# Patient Record
Sex: Female | Born: 1935 | Race: White | Hispanic: No | Smoking: Never smoker
Health system: Southern US, Community
[De-identification: ages and names within clinical notes are randomized; demographics above are authoritative.]

## PROBLEM LIST (undated history)

## (undated) DIAGNOSIS — N949 Unspecified condition associated with female genital organs and menstrual cycle: Secondary | ICD-10-CM

## (undated) DIAGNOSIS — Z780 Asymptomatic menopausal state: Secondary | ICD-10-CM

## (undated) DIAGNOSIS — E7800 Pure hypercholesterolemia, unspecified: Secondary | ICD-10-CM

## (undated) DIAGNOSIS — F0390 Unspecified dementia without behavioral disturbance: Secondary | ICD-10-CM

## (undated) DIAGNOSIS — I429 Cardiomyopathy, unspecified: Secondary | ICD-10-CM

## (undated) DIAGNOSIS — I4891 Unspecified atrial fibrillation: Secondary | ICD-10-CM

## (undated) DIAGNOSIS — I442 Atrioventricular block, complete: Secondary | ICD-10-CM

## (undated) DIAGNOSIS — M1611 Unilateral primary osteoarthritis, right hip: Secondary | ICD-10-CM

## (undated) DIAGNOSIS — M810 Age-related osteoporosis without current pathological fracture: Secondary | ICD-10-CM

## (undated) DIAGNOSIS — E559 Vitamin D deficiency, unspecified: Secondary | ICD-10-CM

## (undated) DIAGNOSIS — M1990 Unspecified osteoarthritis, unspecified site: Secondary | ICD-10-CM

## (undated) DIAGNOSIS — E785 Hyperlipidemia, unspecified: Secondary | ICD-10-CM

## (undated) DIAGNOSIS — R0989 Other specified symptoms and signs involving the circulatory and respiratory systems: Secondary | ICD-10-CM

## (undated) DIAGNOSIS — I10 Essential (primary) hypertension: Secondary | ICD-10-CM

## (undated) DIAGNOSIS — G8929 Other chronic pain: Secondary | ICD-10-CM

## (undated) DIAGNOSIS — Z950 Presence of cardiac pacemaker: Secondary | ICD-10-CM

## (undated) HISTORY — DX: Hypercalcemia: E83.52

## (undated) HISTORY — DX: Other chronic pain: G89.29

## (undated) HISTORY — DX: Asymptomatic menopausal state: Z78.0

## (undated) HISTORY — DX: Pure hypercholesterolemia, unspecified: E78.00

## (undated) HISTORY — DX: Age-related osteoporosis without current pathological fracture: M81.0

## (undated) HISTORY — DX: Essential (primary) hypertension: I10

## (undated) HISTORY — DX: Unspecified condition associated with female genital organs and menstrual cycle: N94.9

## (undated) HISTORY — DX: Hyperlipidemia, unspecified: E78.5

## (undated) HISTORY — PX: COLONOSCOPY: SHX174

## (undated) HISTORY — DX: Other specified symptoms and signs involving the circulatory and respiratory systems: R09.89

## (undated) HISTORY — DX: Vitamin D deficiency, unspecified: E55.9

---
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
## 1988-10-01 LAB — HM COLONOSCOPY

## 2013-10-01 LAB — HM MAMMOGRAPHY: HM Mammogram: NORMAL (ref 0–4)

## 2019-03-02 DIAGNOSIS — I442 Atrioventricular block, complete: Secondary | ICD-10-CM

## 2019-03-02 HISTORY — DX: Atrioventricular block, complete: I44.2

## 2019-03-19 ENCOUNTER — Telehealth: Payer: Self-pay | Admitting: *Deleted

## 2019-03-19 ENCOUNTER — Telehealth: Payer: Self-pay

## 2019-03-19 NOTE — Telephone Encounter (Addendum)
   [facility] Pre-operative Risk Assessment    Request for surgical clearance:  1. What type of surgery is being performed? Left Total Hip Replacement   2. When is this surgery scheduled? TBD   3. What type of clearance is required (medical clearance vs. Pharmacy clearance to hold med vs. Both)? Unknown  4. Are there any medications that need to be held prior to surgery and how long? Unspecified    5. Practice name and name of physician performing surgery? [facility], Dr. French Ana  6. What is your office phone number: [phone]   7.   What is your office fax number: [phone]  8.   Anesthesia type (None, local, MAC, general) ? General   Mary Sella Kordsmeier 03/19/2019, 1:27 PM  _________________________________________________________________   (provider comments below)

## 2019-03-19 NOTE — Telephone Encounter (Signed)
It does not appear that we follow this patient.

## 2019-03-19 NOTE — Telephone Encounter (Signed)
REFERRAL SENT TO SCHEDULING AND NOTES ON FILE FROM [facility] [phone].

## 2019-03-19 NOTE — Telephone Encounter (Signed)
Patient has a referral to see Dr. Acie Fredrickson. Awaiting scheduling.

## 2019-03-20 NOTE — Telephone Encounter (Signed)
Looks like pt is scheduled to see Dr. Virgina Jock, [facility] on 6/25. Note also says that he is referred to Dr. Acie Fredrickson. I am confused as to who the patient needs to see and why.  May need to call pt or find out if he does in fact need to be seen in our clinic.  I will send to Pre-op call back.

## 2019-03-20 NOTE — Telephone Encounter (Addendum)
Spoke with pt, Pt says that she was told by [facility] to establish care with a pcp and a cardiologist prior to her having hip surgery. She is scheduled to see Dr.Patwardhan on 6/25. She is not sure why there is a referral in to see Dr. Acie Fredrickson.

## 2019-03-24 ENCOUNTER — Encounter (HOSPITAL_COMMUNITY): Payer: Self-pay | Admitting: Emergency Medicine

## 2019-03-24 ENCOUNTER — Telehealth: Payer: Self-pay | Admitting: Cardiology

## 2019-03-24 ENCOUNTER — Inpatient Hospital Stay (HOSPITAL_COMMUNITY)
Admission: EM | Admit: 2019-03-24 | Discharge: 2019-03-26 | DRG: 244 | Disposition: A | Discharge status: Home / Self Care | Payer: Medicare Other | Attending: Internal Medicine | Admitting: Internal Medicine

## 2019-03-24 ENCOUNTER — Other Ambulatory Visit: Payer: Self-pay

## 2019-03-24 ENCOUNTER — Ambulatory Visit (INDEPENDENT_AMBULATORY_CARE_PROVIDER_SITE_OTHER): Payer: Medicare Other | Admitting: Internal Medicine

## 2019-03-24 ENCOUNTER — Encounter: Payer: Self-pay | Admitting: Internal Medicine

## 2019-03-24 VITALS — BP 110/68 | HR 68 | Temp 98.4°F | Ht 68.0 in | Wt 118.9 lb

## 2019-03-24 DIAGNOSIS — I10 Essential (primary) hypertension: Secondary | ICD-10-CM | POA: Diagnosis present

## 2019-03-24 DIAGNOSIS — M1611 Unilateral primary osteoarthritis, right hip: Secondary | ICD-10-CM | POA: Diagnosis present

## 2019-03-24 DIAGNOSIS — I442 Atrioventricular block, complete: Principal | ICD-10-CM | POA: Diagnosis present

## 2019-03-24 DIAGNOSIS — Z1159 Encounter for screening for other viral diseases: Secondary | ICD-10-CM | POA: Diagnosis not present

## 2019-03-24 DIAGNOSIS — Z8249 Family history of ischemic heart disease and other diseases of the circulatory system: Secondary | ICD-10-CM | POA: Diagnosis not present

## 2019-03-24 DIAGNOSIS — Z01818 Encounter for other preprocedural examination: Secondary | ICD-10-CM | POA: Diagnosis not present

## 2019-03-24 DIAGNOSIS — Z79899 Other long term (current) drug therapy: Secondary | ICD-10-CM

## 2019-03-24 DIAGNOSIS — Z7982 Long term (current) use of aspirin: Secondary | ICD-10-CM | POA: Diagnosis not present

## 2019-03-24 DIAGNOSIS — Z95818 Presence of other cardiac implants and grafts: Secondary | ICD-10-CM

## 2019-03-24 LAB — CBC
HCT: 39.2 % (ref 36.0–46.0)
Hemoglobin: 12.1 g/dL (ref 12.0–15.0)
MCH: 27.3 pg (ref 26.0–34.0)
MCHC: 30.9 g/dL (ref 30.0–36.0)
MCV: 88.3 fL (ref 80.0–100.0)
Platelets: 226 10*3/uL (ref 150–400)
RBC: 4.44 MIL/uL (ref 3.87–5.11)
RDW: 14.1 % (ref 11.5–15.5)
WBC: 6.9 10*3/uL (ref 4.0–10.5)
nRBC: 0 % (ref 0.0–0.2)

## 2019-03-24 LAB — BASIC METABOLIC PANEL
Anion gap: 9 (ref 5–15)
BUN: 25 mg/dL — ABNORMAL HIGH (ref 8–23)
CO2: 24 mmol/L (ref 22–32)
Calcium: 10.2 mg/dL (ref 8.9–10.3)
Chloride: 106 mmol/L (ref 98–111)
Creatinine, Ser: 1.27 mg/dL — ABNORMAL HIGH (ref 0.44–1.00)
GFR calc Af Amer: 45 mL/min — ABNORMAL LOW (ref >60)
GFR calc non Af Amer: 39 mL/min — ABNORMAL LOW (ref >60)
Glucose, Bld: 107 mg/dL — ABNORMAL HIGH (ref 70–99)
Potassium: 4.2 mmol/L (ref 3.5–5.1)
Sodium: 139 mmol/L (ref 135–145)

## 2019-03-24 LAB — SARS CORONAVIRUS 2 BY RT PCR (HOSPITAL ORDER, PERFORMED IN ~~LOC~~ HOSPITAL LAB): SARS Coronavirus 2: NEGATIVE

## 2019-03-24 LAB — TSH: TSH: 0.448 u[IU]/mL (ref 0.350–4.500)

## 2019-03-24 LAB — TROPONIN I (HIGH SENSITIVITY): Troponin I (High Sensitivity): 12 ng/L (ref <18)

## 2019-03-24 MED ORDER — HEPARIN SODIUM (PORCINE) 5000 UNIT/ML IJ SOLN
5000.0000 [IU] | Freq: Three times a day (TID) | INTRAMUSCULAR | Status: DC
  Administered 2019-03-25 – 2019-03-26 (×4): 5000 [IU] via SUBCUTANEOUS
  Filled 2019-03-24 (×3): qty 1

## 2019-03-24 MED ORDER — ENALAPRIL MALEATE 20 MG PO TABS
10.0000 mg | ORAL_TABLET | Freq: Every day | ORAL | Status: DC
  Administered 2019-03-25 – 2019-03-26 (×2): 10 mg via ORAL
  Filled 2019-03-24 (×3): qty 1

## 2019-03-24 MED ORDER — VITAMIN D 25 MCG (1000 UNIT) PO TABS
1000.0000 [IU] | ORAL_TABLET | Freq: Every day | ORAL | Status: DC
  Administered 2019-03-26: 10:00:00 1000 [IU] via ORAL
  Filled 2019-03-24: qty 1

## 2019-03-24 MED ORDER — HYDROCHLOROTHIAZIDE 12.5 MG PO CAPS
12.5000 mg | ORAL_CAPSULE | Freq: Every day | ORAL | Status: DC
  Administered 2019-03-25 – 2019-03-26 (×2): 12.5 mg via ORAL
  Filled 2019-03-24 (×2): qty 1

## 2019-03-24 MED ORDER — SODIUM CHLORIDE 0.9% FLUSH: 3.0000 mL | Freq: Once | INTRAVENOUS | Status: DC

## 2019-03-24 MED ORDER — ASPIRIN EC 81 MG PO TBEC
81.0000 mg | DELAYED_RELEASE_TABLET | Freq: Every day | ORAL | Status: DC
  Administered 2019-03-25 – 2019-03-26 (×2): 81 mg via ORAL
  Filled 2019-03-24 (×2): qty 1

## 2019-03-24 NOTE — Telephone Encounter (Signed)
Dr Stanford Breed reviewed EKG in chart and advised the patient go to er for evaluation. EKG shows complete heart block. Engineer, maintenance (IT) made aware.

## 2019-03-24 NOTE — ED Triage Notes (Signed)
Pt arrives to triage room confused on why her son brought her to the ER- pt states "I feel perfectly fine Im not sure why my son brought me". Pt is alert to person and time but confused on exactly where she is  Just knows she is somewhere in [place] but could not name the type of place or building this is. Trying to locate son for further info. Pt is bradycardic   in triage.

## 2019-03-24 NOTE — ED Provider Notes (Signed)
[facility] Provider Note   CSN: [id number] Arrival date & time: 03/24/19  1710    History   Chief Complaint Chief Complaint  Patient presents with  . Bradycardia    HPI Kathryn Strickland is a 85 y.o. female.     HPI   She saw her PCP today for pre-op evaluation preceding right hip surgery.  She denies chest pain, shortness of breath, cough, weakness or dizziness.  No preceding similar problem.  Patient is a moderately poor historian.  There are no other known modifying factors.  Past Medical History:  Diagnosis Date  . Hypertension     Patient Active Problem List   Diagnosis Date Noted  . Hypertension     No past surgical history on file.   OB History   No obstetric history on file.      Home Medications    Prior to Admission medications   Medication Sig Start Date End Date Taking? Authorizing Provider  aspirin EC 81 MG tablet Take 81 mg by mouth daily.   Yes [provider]  Cholecalciferol (VITAMIN D-3) 25 MCG (1000 UT) CAPS Take 1,000 Units by mouth daily with breakfast.   Yes [provider]  enalapril (VASOTEC) 10 MG tablet Take 10 mg by mouth daily.   Yes [provider]  hydrochlorothiazide (MICROZIDE) 12.5 MG capsule Take 12.5 mg by mouth daily.   Yes [provider]    Family History Family History  Problem Relation Age of Onset  . Heart disease Father     Social History Social History   Tobacco Use  . Smoking status: Never Smoker  . Smokeless tobacco: Never Used  Substance Use Topics  . Alcohol use: Never    Frequency: Never  . Drug use: Never     Allergies   Patient has no known allergies.   Review of Systems Review of Systems  All other systems reviewed and are negative.    Physical Exam Updated Vital Signs BP (!) 103/55   Pulse (!) 33   Temp 98 F (36.7 C) (Oral)   Resp 12   Ht 5\' 8"  (1.727 m)   Wt 53.9 kg   SpO2 100%   BMI 18.08 kg/m    Physical Exam Vitals signs and nursing note reviewed.  Constitutional:      General: She is not in acute distress.    Appearance: She is well-developed. She is not ill-appearing, toxic-appearing or diaphoretic.  HENT:     Head: Normocephalic and atraumatic.     Right Ear: External ear normal.     Left Ear: External ear normal.  Eyes:     Conjunctiva/sclera: Conjunctivae normal.     Pupils: Pupils are equal, round, and reactive to light.  Neck:     Musculoskeletal: Normal range of motion and neck supple.     Trachea: Phonation normal.  Cardiovascular:     Rate and Rhythm: Normal rate and regular rhythm.     Heart sounds: Normal heart sounds.  Pulmonary:     Effort: Pulmonary effort is normal.     Breath sounds: Normal breath sounds.  Abdominal:     Palpations: Abdomen is soft.     Tenderness: There is no abdominal tenderness.  Musculoskeletal: Normal range of motion.  Skin:    General: Skin is warm and dry.  Neurological:     Mental Status: She is alert and oriented to person, place, and time.     Cranial Nerves: No  cranial nerve deficit.     Sensory: No sensory deficit.     Motor: No abnormal muscle tone.     Coordination: Coordination normal.  Psychiatric:        Behavior: Behavior normal.        Thought Content: Thought content normal.        Judgment: Judgment normal.      ED Treatments / Results  Labs (all labs ordered are listed, but only abnormal results are displayed) Labs Reviewed  BASIC METABOLIC PANEL - Abnormal; Notable for the following components:      Result Value   Glucose, Bld 107 (*)    BUN 25 (*)    Creatinine, Ser 1.27 (*)    GFR calc non Af Amer 39 (*)    GFR calc Af Amer 45 (*)    All other components within normal limits  SARS CORONAVIRUS 2 (HOSPITAL ORDER, [facility])  CBC  TROPONIN I (HIGH SENSITIVITY)    EKG EKG Interpretation  Date/Time:  Tuesday March 24 2019 17:19:35 EDT Ventricular Rate:  35 PR  Interval:    QRS Duration: 74 QT Interval:  576 QTC Calculation: 439 R Axis:   -52 Text Interpretation:  bradycardia with  complete heart block Left axis deviation Anterior infarct , age undetermined Abnormal ECG No old tracing to compare Reconfirmed by Daleen Bo [phone]) on 03/24/2019 6:22:04 PM   Radiology No results found.  Procedures Procedures (including critical care time)  Medications Ordered in ED Medications  sodium chloride flush (NS) 0.9 % injection 3 mL (has no administration in time range)     Initial Impression / Assessment and Plan / ED Course  I have reviewed the triage vital signs and the nursing notes.  Pertinent labs & imaging results that were available during my care of the patient were reviewed by me and considered in my medical decision making (see chart for details).  Clinical Course as of Mar 23 1948  Tue Mar 24, 2019  1936 Normal except glucose high, BUN high, creatinine high, GFR low  Basic metabolic panel(!) [EW]  1740 Normal  Troponin I (High Sensitivity) [EW]  1937 Normal  CBC [EW]  1944 Case discussed with cardiologist, Dr. Debara Pickett, who will see the patient in the ED and admit her.   [EW]  1948 Discussed with son, Aaron Edelman. Questions answered.    [EW]    Clinical Course User Index [EW] Daleen Bo, MD        Patient Vitals for the past 24 hrs:  BP Temp Temp src Pulse Resp SpO2 Height Weight  03/24/19 1845 (!) 103/55 - - (!) 33 12 100 % - -  03/24/19 1829 (!) 161/58 - - (!) 33 16 100 % - -  03/24/19 1827 - - - - - - 5\' 8"  (1.727 m) 53.9 kg  03/24/19 1722 (!) 172/60 98 F (36.7 C) Oral (!) 35 16 100 % - -    7:37 PM Reevaluation with update and discussion. After initial assessment and treatment, an updated evaluation reveals no change in clinical status.  She continues to be asymptomatic with pulse rate in the mid 30s.  Findings discussed with the patient and all questions were answered. Daleen Bo   Medical Decision Making:  Complete heart block, hemodynamically stable.  There is no clear cause for the heart block, therefore she likely has sick sinus syndrome.  Patient requires hospitalization for pacemaker placement, to improve her ability to proceed with right hip  surgery for arthritis.  Doubt serious bacterial infection, metabolic instability, ACS or impending vascular collapse.  CRITICAL CARE-no Performed by: Mancel BaleElliott Takela Varden  Nursing Notes Reviewed/ Care Coordinated Applicable Imaging Reviewed Interpretation of Laboratory Data incorporated into ED treatment  7:35 PM-request callback from cardiology for admission.  Dr. Rennis GoldenHilty will admit the patient/7:45 PM  Plan: Admit  Final Clinical Impressions(s) / ED Diagnoses   Final diagnoses:  Complete heart block [facility](HCC)    ED Discharge Orders    None       Mancel BaleWentz, Davis Vannatter, MD 03/24/19 1950

## 2019-03-24 NOTE — Patient Instructions (Signed)
-  Nice meeting you today!  -If you decide to stay in [place], please schedule a follow up in 6 months and have prior medical records sent to our office.  -Good luck with your hip surgery!

## 2019-03-24 NOTE — Progress Notes (Addendum)
New Patient Office Visit     CC/Reason for Visit: Establish care, preoperative clearance Previous PCP: Dr. Etheleen Sia in [place] Last Visit: January 2020  HPI: Kathryn Strickland is a 85 y.o. female who is coming in today for the above mentioned reasons. Past Medical History is significant for: Well-controlled hypertension on enalapril and hydrochlorothiazide as well as right hip osteoarthritis.  She comes in today accompanied by her son.  Patient lives in [place] but has come to [place] as she will need her right hip replaced and son wanted to have this done in [place] where he could be of assistance.  They have already seen Dr. French Ana with orthopedics who is requesting surgical clearance.  She has been having right hip pain for years, in the last 6 months has been using a walker.  She is not very physically active, however in her home she is able to go down to the basement where her freezer and laundry is and climb up the stairs without any issues. No SOB/DOE/CP.   Past Medical/Surgical History: Past Medical History:  Diagnosis Date  . Hypertension     History reviewed. No pertinent surgical history.  Social History:  reports that she has never smoked. She has never used smokeless tobacco. She reports that she does not drink alcohol or use drugs.  Allergies: No Known Allergies  Family History:  Family History  Problem Relation Age of Onset  . Heart disease Father      Current Outpatient Medications:  .  aspirin EC 81 MG tablet, Take 81 mg by mouth daily., Disp: , Rfl:  .  Cholecalciferol (VITAMIN D) 125 MCG (5000 UT) CAPS, Take by mouth., Disp: , Rfl:  .  enalapril (VASOTEC) 10 MG tablet, Take 10 mg by mouth daily., Disp: , Rfl:  .  hydrochlorothiazide (MICROZIDE) 12.5 MG capsule, Take 12.5 mg by mouth daily., Disp: , Rfl:   Review of Systems:  Constitutional: Denies fever, chills, diaphoresis, appetite change and fatigue.  HEENT: Denies photophobia, eye  pain, redness, hearing loss, ear pain, congestion, sore throat, rhinorrhea, sneezing, mouth sores, trouble swallowing, neck pain, neck stiffness and tinnitus.   Respiratory: Denies SOB, DOE, cough, chest tightness,  and wheezing.   Cardiovascular: Denies chest pain, palpitations and leg swelling.  Gastrointestinal: Denies nausea, vomiting, abdominal pain, diarrhea, constipation, blood in stool and abdominal distention.  Genitourinary: Denies dysuria, urgency, frequency, hematuria, flank pain and difficulty urinating.  Endocrine: Denies: hot or cold intolerance, sweats, changes in hair or nails, polyuria, polydipsia. Musculoskeletal: Denies myalgias, back pain, joint swelling, arthralgias and gait problem.  Skin: Denies pallor, rash and wound.  Neurological: Denies dizziness, seizures, syncope, weakness, light-headedness, numbness and headaches.  Hematological: Denies adenopathy. Easy bruising, personal or family bleeding history  Psychiatric/Behavioral: Denies suicidal ideation, mood changes, confusion, nervousness, sleep disturbance and agitation    Physical Exam: Vitals:   03/24/19 1533  BP: 110/68  Pulse: 68  Temp: 98.4 F (36.9 C)  TempSrc: Oral  SpO2: 94%  Weight: 118 lb 14.4 oz (53.9 kg)  Height: 5\' 8"  (1.727 m)   Body mass index is 18.08 kg/m.  Constitutional: NAD, calm, comfortable Eyes: PERRL, lids and conjunctivae normal, wears corrective lenses ENMT: Mucous membranes are moist.  Neck: normal, supple, no masses, no thyromegaly Respiratory: clear to auscultation bilaterally, no wheezing, no crackles. Normal respiratory effort. No accessory muscle use.  Cardiovascular:bradycardic, regular rhythm, no murmurs / rubs / gallops. No extremity edema. 2+ pedal pulses. No carotid bruits.  Abdomen:  no tenderness, no masses palpated. No hepatosplenomegaly. Bowel sounds positive.  Musculoskeletal: no clubbing / cyanosis. No joint deformity upper and lower extremities. Good ROM, no  contractures. Normal muscle tone.  Skin: no rashes, lesions, ulcers. No induration Neurologic: CN 2-12 grossly intact. Sensation intact, DTR normal. Strength 5/5 in all 4.  Psychiatric: Normal judgment and insight. Alert and oriented x 3. Normal mood.    Impression and Plan:  Preoperative clearance  Complete heart block (HCC)  -As part of pre-op clearance, we have performed an in office EKG: interpreted by myself as complete heart block with a rate of 35. -I have discussed case with cards DOD, Dr. Jens Somrenshaw who agrees and recommends patient proceed to the ED for pacemaker. He will notify cardiology on call. -not on BB on any other rate-suppressing meds.  Essential hypertension  -Well controlled on current meds.  Arthritis of right hip -Plan for eventual right rip replacement with Dr. Madelon Lipsaffrey.      Patient Instructions  -Nice meeting you today!  -If you decide to stay in [place], please schedule a follow up in 6 months and have prior medical records sent to our office.  -Good luck with your hip surgery!       Chaya JanEstela Hernandez Acosta, MD [facility]

## 2019-03-24 NOTE — Telephone Encounter (Signed)
   Primary Cardiologist: No primary care provider on file.  Chart reviewed as part of pre-operative protocol coverage. As below, patient is not followed by our practice - already has appointment with Dr. Virgina Jock with [facility] on 03/26/19. Will route to requesting surgeon to make them aware.  Will also route to callback staff to cancel referral that was pending for July with our practice (not sure why she got referred to both practices).  [name], PA-C 03/24/2019, 9:14 AM

## 2019-03-24 NOTE — H&P (Addendum)
Cardiology Admission History and Physical:   Patient ID: Kathryn Strickland MRN: [id number]; DOB: 04-23-1933   Admission date: 03/24/2019  Primary Care Provider: Patient, No Pcp Per Primary Cardiologist: No primary care provider on file.  Primary Electrophysiologist:  None   Chief Complaint:  Complete heart block  Patient Profile:   Kathryn Strickland is a 85 y.o. female with a history of hypertension. She lives in [place] and came to [place] to have a hip replacement and wanted to be near her son who lives here. Presented to Dr. Isaac Bliss, Family Medicine, today for preop clearance and was found to be in complete heart block with rate of 35. She was asymptomatic. Sent to ED for further evaluation.   History of Present Illness:   Kathryn Strickland has a past medical history of hypertension treated with enalapril and hydrochlorothiazide. As above she was found to being complete heart block during preop clearance appointment. She is asymptomatic. On no AV nodal blocking agents.   She has never smoked and does not drink alcohol. She is not very active, using a walker for the last 6 months due to hip pain. She is able to go downstairs to her basement where the freezer and laundry are without any issues.   Heart Pathway Score:     Past Medical History:  Diagnosis Date  . Hypertension     No past surgical history on file.   Medications Prior to Admission: Prior to Admission medications   Medication Sig Start Date End Date Taking? Authorizing Provider  aspirin EC 81 MG tablet Take 81 mg by mouth daily.    [provider]  Cholecalciferol (VITAMIN D) 125 MCG (5000 UT) CAPS Take by mouth.    [provider]  enalapril (VASOTEC) 10 MG tablet Take 10 mg by mouth daily.    [provider]  hydrochlorothiazide (MICROZIDE) 12.5 MG capsule Take 12.5 mg by mouth daily.    [provider]     Allergies:   No Known Allergies  Social History:   Social History    Socioeconomic History  . Marital status: Widowed    Spouse name: Not on file  . Number of children: Not on file  . Years of education: Not on file  . Highest education level: Not on file  Occupational History  . Not on file  Social Needs  . Financial resource strain: Not on file  . Food insecurity    Worry: Not on file    Inability: Not on file  . Transportation needs    Medical: Not on file    Non-medical: Not on file  Tobacco Use  . Smoking status: Never Smoker  . Smokeless tobacco: Never Used  Substance and Sexual Activity  . Alcohol use: Never    Frequency: Never  . Drug use: Never  . Sexual activity: Not on file  Lifestyle  . Physical activity    Days per week: Not on file    Minutes per session: Not on file  . Stress: Not on file  Relationships  . Social Herbalist on phone: Not on file    Gets together: Not on file    Attends religious service: Not on file    Active member of club or organization: Not on file    Attends meetings of clubs or organizations: Not on file    Relationship status: Not on file  . Intimate partner violence    Fear of current or ex partner: Not on  file    Emotionally abused: Not on file    Physically abused: Not on file    Forced sexual activity: Not on file  Other Topics Concern  . Not on file  Social History Narrative  . Not on file    Family History:   The patient's family history includes Heart disease in her father.    ROS:  Please see the history of present illness.  All other ROS reviewed and negative.     Physical Exam/Data:   Vitals:   03/24/19 1722  BP: (!) 172/60  Pulse: (!) 35  Resp: 16  Temp: 98 F (36.7 C)  TempSrc: Oral  SpO2: 100%   No intake or output data in the 24 hours ending 03/24/19 1732 Last 3 Weights 03/24/2019  Weight (lbs) 118 lb 14.4 oz  Weight (kg) 53.933 kg     There is no height or weight on file to calculate BMI.   Physical exam per MD   EKG:  The ECG that was done was  personally reviewed and demonstrates 3rd degree AV block, 35 bpm  Relevant CV Studies:  None  Laboratory Data:  High Sensitivity Troponin:  No results for input(s): TROPONINIHS in the last 720 hours.    Cardiac EnzymesNo results for input(s): TROPONINI in the last 168 hours. No results for input(s): TROPIPOC in the last 168 hours.  ChemistryNo results for input(s): NA, K, CL, CO2, GLUCOSE, BUN, CREATININE, CALCIUM, GFRNONAA, GFRAA, ANIONGAP in the last 168 hours.  No results for input(s): PROT, ALBUMIN, AST, ALT, ALKPHOS, BILITOT in the last 168 hours. HematologyNo results for input(s): WBC, RBC, HGB, HCT, MCV, MCH, MCHC, RDW, PLT in the last 168 hours. BNPNo results for input(s): BNP, PROBNP in the last 168 hours.  DDimer No results for input(s): DDIMER in the last 168 hours.   Radiology/Studies:  No results found.  Assessment and Plan:   1. Complete heart block -Pt is stable and asymptomatic.  -Watch over night and will have EP consult tomorrow.  -No AV nodal blocking agents. -Check labs and TSH.   2. Hypertension -BP elevated.  -Continue home meds and monitor BP.    Severity of Illness: The appropriate patient status for this patient is INPATIENT. Inpatient status is judged to be reasonable and necessary in order to provide the required intensity of service to ensure the patient's safety. The patient's presenting symptoms, physical exam findings, and initial radiographic and laboratory data in the context of their chronic comorbidities is felt to place them at high risk for further clinical deterioration. Furthermore, it is not anticipated that the patient will be medically stable for discharge from the hospital within 2 midnights of admission. The following factors support the patient status of inpatient.   " The patient's presenting symptoms include no symptoms. " The worrisome physical exam findings include none. " The initial radiographic and laboratory data are worrisome  because of EKG with complete heart block. " The chronic co-morbidities include hypertension.   * I certify that at the point of admission it is my clinical judgment that the patient will require inpatient hospital care spanning beyond 2 midnights from the point of admission due to high intensity of service, high risk for further deterioration and high frequency of surveillance required.*    For questions or updates, please contact [facility] Please consult [email or web address] for contact info under        Signed, Berton BonJanine Megan Presti, NP  03/24/2019 5:32 PM

## 2019-03-25 ENCOUNTER — Encounter (HOSPITAL_COMMUNITY)
Admission: EM | Disposition: A | Discharge status: Home / Self Care | Payer: Self-pay | Source: Home / Self Care | Attending: Internal Medicine

## 2019-03-25 ENCOUNTER — Encounter (HOSPITAL_COMMUNITY): Payer: Self-pay | Admitting: General Practice

## 2019-03-25 DIAGNOSIS — I442 Atrioventricular block, complete: Secondary | ICD-10-CM

## 2019-03-25 DIAGNOSIS — Z950 Presence of cardiac pacemaker: Secondary | ICD-10-CM

## 2019-03-25 HISTORY — PX: PACEMAKER IMPLANT: EP1218

## 2019-03-25 HISTORY — DX: Presence of cardiac pacemaker: Z95.0

## 2019-03-25 LAB — BASIC METABOLIC PANEL
Anion gap: 10 (ref 5–15)
BUN: 20 mg/dL (ref 8–23)
CO2: 22 mmol/L (ref 22–32)
Calcium: 10 mg/dL (ref 8.9–10.3)
Chloride: 107 mmol/L (ref 98–111)
Creatinine, Ser: 1.03 mg/dL — ABNORMAL HIGH (ref 0.44–1.00)
GFR calc Af Amer: 58 mL/min — ABNORMAL LOW (ref >60)
GFR calc non Af Amer: 50 mL/min — ABNORMAL LOW (ref >60)
Glucose, Bld: 101 mg/dL — ABNORMAL HIGH (ref 70–99)
Potassium: 3.9 mmol/L (ref 3.5–5.1)
Sodium: 139 mmol/L (ref 135–145)

## 2019-03-25 LAB — SURGICAL PCR SCREEN
MRSA, PCR: NEGATIVE
Staphylococcus aureus: POSITIVE — AB

## 2019-03-25 SURGERY — PACEMAKER IMPLANT

## 2019-03-25 MED ORDER — CEFAZOLIN SODIUM-DEXTROSE 2-4 GM/100ML-% IV SOLN
2.0000 g | INTRAVENOUS | Status: AC
  Administered 2019-03-25: 2 g via INTRAVENOUS

## 2019-03-25 MED ORDER — HEPARIN (PORCINE) IN NACL 1000-0.9 UT/500ML-% IV SOLN
INTRAVENOUS | Status: DC | PRN
  Administered 2019-03-25: 500 mL

## 2019-03-25 MED ORDER — CHLORHEXIDINE GLUCONATE 4 % EX LIQD
60.0000 mL | Freq: Once | CUTANEOUS | Status: AC
  Administered 2019-03-25: 4 via TOPICAL

## 2019-03-25 MED ORDER — CHLORHEXIDINE GLUCONATE 4 % EX LIQD
60.0000 mL | Freq: Once | CUTANEOUS | Status: AC
  Filled 2019-03-25: qty 60

## 2019-03-25 MED ORDER — MUPIROCIN 2 % EX OINT
1.0000 "application " | TOPICAL_OINTMENT | Freq: Two times a day (BID) | CUTANEOUS | Status: DC
  Administered 2019-03-25 – 2019-03-26 (×2): 1 via NASAL
  Filled 2019-03-25: qty 22

## 2019-03-25 MED ORDER — LIDOCAINE HCL (PF) 1 % IJ SOLN
INTRAMUSCULAR | Status: AC
  Filled 2019-03-25: qty 90

## 2019-03-25 MED ORDER — LIDOCAINE HCL (PF) 1 % IJ SOLN
INTRAMUSCULAR | Status: DC | PRN
  Administered 2019-03-25: 50 mL

## 2019-03-25 MED ORDER — CEFAZOLIN SODIUM-DEXTROSE 1-4 GM/50ML-% IV SOLN
1.0000 g | Freq: Four times a day (QID) | INTRAVENOUS | Status: AC
  Administered 2019-03-25 – 2019-03-26 (×3): 1 g via INTRAVENOUS
  Filled 2019-03-25 (×4): qty 50

## 2019-03-25 MED ORDER — YOU HAVE A PACEMAKER BOOK
Freq: Once | Status: AC
  Administered 2019-03-26
  Filled 2019-03-25: qty 1

## 2019-03-25 MED ORDER — SODIUM CHLORIDE 0.9 % IV SOLN
INTRAVENOUS | Status: DC | PRN
  Administered 2019-03-25 – 2019-03-26 (×2): 1000 mL via INTRAVENOUS

## 2019-03-25 MED ORDER — CHLORHEXIDINE GLUCONATE CLOTH 2 % EX PADS: 6.0000 | MEDICATED_PAD | Freq: Every day | CUTANEOUS | Status: DC

## 2019-03-25 MED ORDER — HEPARIN (PORCINE) IN NACL 1000-0.9 UT/500ML-% IV SOLN
INTRAVENOUS | Status: AC
  Filled 2019-03-25: qty 500

## 2019-03-25 MED ORDER — SODIUM CHLORIDE 0.9 % IV SOLN
INTRAVENOUS | Status: AC
  Filled 2019-03-25: qty 2

## 2019-03-25 MED ORDER — SODIUM CHLORIDE 0.9 % IV SOLN
INTRAVENOUS | Status: DC
  Administered 2019-03-25: 09:00:00 via INTRAVENOUS

## 2019-03-25 MED ORDER — SODIUM CHLORIDE 0.9 % IV SOLN
INTRAVENOUS | Status: AC | PRN
  Administered 2019-03-25: 100 mL/h via INTRAVENOUS

## 2019-03-25 MED ORDER — ONDANSETRON HCL 4 MG/2ML IJ SOLN: 4.0000 mg | Freq: Four times a day (QID) | INTRAMUSCULAR | Status: DC | PRN

## 2019-03-25 MED ORDER — CEFAZOLIN SODIUM-DEXTROSE 2-4 GM/100ML-% IV SOLN
INTRAVENOUS | Status: AC
  Filled 2019-03-25: qty 100

## 2019-03-25 MED ORDER — SODIUM CHLORIDE 0.9 % IV SOLN
80.0000 mg | INTRAVENOUS | Status: AC
  Administered 2019-03-25: 80 mg

## 2019-03-25 MED ORDER — ACETAMINOPHEN 325 MG PO TABS: 325.0000 mg | ORAL_TABLET | ORAL | Status: DC | PRN

## 2019-03-25 SURGICAL SUPPLY — 9 items
CABLE SURGICAL S-101-97-12 (CABLE) ×3 IMPLANT
COVER DOME SNAP 22 D (MISCELLANEOUS) ×3 IMPLANT
IPG PACE AZUR XT DR MRI W1DR01 (Pacemaker) ×1 IMPLANT
LEAD CAPSURE NOVUS 5076-52CM (Lead) ×3 IMPLANT
LEAD CAPSURE NOVUS 5076-58CM (Lead) ×3 IMPLANT
PACE AZURE XT DR MRI W1DR01 (Pacemaker) ×3 IMPLANT
PAD PRO RADIOLUCENT 2001M-C (PAD) ×3 IMPLANT
SHEATH CLASSIC 7F (SHEATH) ×6 IMPLANT
TRAY PACEMAKER INSERTION (PACKS) ×3 IMPLANT

## 2019-03-25 NOTE — ED Triage Notes (Signed)
Attempted to call report . Rn in Pt room and can not take report.

## 2019-03-25 NOTE — ED Notes (Signed)
Went into room for rounding and found patient had pulled all her monitor leads and o2sat monitor off and gown, I ask her why she had taken everything off she laughed and said I thought I was ready to go home. When ask questions patient is totally alert and oriented . Instructed patient to not take her monitor leads off and she stated she wouldn't .

## 2019-03-25 NOTE — ED Notes (Signed)
Cardiology at bedside , aware patient is confused at times and he will need to talk to the son.

## 2019-03-25 NOTE — ED Notes (Signed)
ED TO INPATIENT HANDOFF REPORT  ED Nurse Name and Phone #:   S Name/Age/Gender Kathryn Strickland 85 y.o. female Room/Bed: 037C/037C  Code Status   Code Status: Full Code  Home/SNF/Other Home Patient oriented to: self, place, time and situation Is this baseline? Yes   Triage Complete: Triage complete  Chief Complaint Low heart rate  Triage Note Pt arrives to triage room confused on why her son brought her to the ER- pt states "I feel perfectly fine Im not sure why my son brought me". Pt is alert to person and time but confused on exactly where she is  Just knows she is somewhere in [place] but could not name the type of place or building this is. Trying to locate son for further info. Pt is bradycardic   in triage.     Allergies No Known Allergies  Level of Care/Admitting Diagnosis ED Disposition    ED Disposition Condition Comment   Admit  Hospital Area: [facility] [[id number]]  Level of Care: Telemetry Cardiac [103]  Covid Evaluation: Screening Protocol (No Symptoms)  Diagnosis: Complete heart block [facility](HCC) [[id number]]) [[id number]]  Admitting Physician: Kela MillinHILTY, KENNETH C [[id number]]  Attending Physician: Chrystie NoseHILTY, KENNETH C [phone][[id number]]  Estimated length of stay: past midnight tomorrow  Certification:: I certify this patient will need inpatient services for at least 2 midnights  PT Class (Do Not Modify): Inpatient [101]  PT Acc Code (Do Not Modify): Private [1]       B Medical/Surgery History Past Medical History:  Diagnosis Date  . Hypertension    No past surgical history on file.   A IV Location/Drains/Wounds Patient Lines/Drains/Airways Status   Active Line/Drains/Airways    Name:   Placement date:   Placement time:   Site:   Days:   Peripheral IV 03/24/19 Right Antecubital   03/24/19    1836    Antecubital   1   Peripheral IV 03/25/19 Left Forearm   03/25/19    0824    Forearm   less than 1          Intake/Output Last 24 hours No intake or output data in the 24  hours ending 03/25/19 1150  Labs/Imaging Results for orders placed or performed during the hospital encounter of 03/24/19 (from the past 48 hour(s))  Basic metabolic panel     Status: Abnormal   Collection Time: 03/24/19  5:45 PM  Result Value Ref Range   Sodium 139 135 - 145 mmol/L   Potassium 4.2 3.5 - 5.1 mmol/L   Chloride 106 98 - 111 mmol/L   CO2 24 22 - 32 mmol/L   Glucose, Bld 107 (H) 70 - 99 mg/dL   BUN 25 (H) 8 - 23 mg/dL   Creatinine, Ser 0.981.27 (H) 0.44 - 1.00 mg/dL   Calcium 11.910.2 8.9 - 14.710.3 mg/dL   GFR calc non Af Amer 39 (L) >60 mL/min   GFR calc Af Amer 45 (L) >60 mL/min   Anion gap 9 5 - 15    Comment: Performed at [facility], [address] [place], [place] [address]  CBC     Status: None   Collection Time: 03/24/19  5:45 PM  Result Value Ref Range   WBC 6.9 4.0 - 10.5 K/uL   RBC 4.44 3.87 - 5.11 MIL/uL   Hemoglobin 12.1 12.0 - 15.0 g/dL   HCT 21.339.2 08.636.0 - 57.846.0 %   MCV 88.3 80.0 - 100.0 fL   MCH 27.3 26.0 - 34.0 pg  MCHC 30.9 30.0 - 36.0 g/dL   RDW 14.1 11.5 - 15.5 %   Platelets 226 150 - 400 K/uL   nRBC 0.0 0.0 - 0.2 %    Comment: Performed at [facility], [place] [address] [place], [place] [address]  Troponin I (High Sensitivity)     Status: None   Collection Time: 03/24/19  5:45 PM  Result Value Ref Range   Troponin I (High Sensitivity) 12 <18 ng/L    Comment: (NOTE) Elevated high sensitivity troponin I (hsTnI) values and significant  changes across serial measurements may suggest ACS but many other  chronic and acute conditions are known to elevate hsTnI results.  Refer to the "Links" section for chest pain algorithms and additional  guidance. Performed at [facility], [place] [address], [place], [place] [address]   SARS Coronavirus 2 (CEPHEID - Performed in [place] hospital lab), Hosp Order     Status: None   Collection Time: 03/24/19  6:50 PM   Specimen: Nasopharyngeal Swab  Result Value Ref Range   SARS Coronavirus 2  NEGATIVE NEGATIVE    Comment: (NOTE) If result is NEGATIVE SARS-CoV-2 target nucleic acids are NOT DETECTED. The SARS-CoV-2 RNA is generally detectable in upper and lower  respiratory specimens during the acute phase of infection. The lowest  concentration of SARS-CoV-2 viral copies this assay can detect is 250  copies / mL. A negative result does not preclude SARS-CoV-2 infection  and should not be used as the sole basis for treatment or other  patient management decisions.  A negative result may occur with  improper specimen collection / handling, submission of specimen other  than nasopharyngeal swab, presence of viral mutation(s) within the  areas targeted by this assay, and inadequate number of viral copies  (<250 copies / mL). A negative result must be combined with clinical  observations, patient history, and epidemiological information. If result is POSITIVE SARS-CoV-2 target nucleic acids are DETECTED. The SARS-CoV-2 RNA is generally detectable in upper and lower  respiratory specimens dur ing the acute phase of infection.  Positive  results are indicative of active infection with SARS-CoV-2.  Clinical  correlation with patient history and other diagnostic information is  necessary to determine patient infection status.  Positive results do  not rule out bacterial infection or co-infection with other viruses. If result is PRESUMPTIVE POSTIVE SARS-CoV-2 nucleic acids MAY BE PRESENT.   A presumptive positive result was obtained on the submitted specimen  and confirmed on repeat testing.  While 2019 novel coronavirus  (SARS-CoV-2) nucleic acids may be present in the submitted sample  additional confirmatory testing may be necessary for epidemiological  and / or clinical management purposes  to differentiate between  SARS-CoV-2 and other Sarbecovirus currently known to infect humans.  If clinically indicated additional testing with an alternate test  methodology [phone]) is  advised. The SARS-CoV-2 RNA is generally  detectable in upper and lower respiratory sp ecimens during the acute  phase of infection. The expected result is Negative. Fact Sheet for Patients:  StrictlyIdeas.no Fact Sheet for Healthcare Providers: BankingDealers.co.za This test is not yet approved or cleared by the [place] FDA and has been authorized for detection and/or diagnosis of SARS-CoV-2 by FDA under an Emergency Use Authorization (EUA).  This EUA will remain in effect (meaning this test can be used) for the duration of the COVID-19 declaration under Section 564(b)(1) of the Act, 21 U.S.C. section 360bbb-3(b)(1), unless the authorization is terminated or revoked  sooner. Performed at [facility], [address] [place], [place] [address]   TSH     Status: None   Collection Time: 03/24/19  9:59 PM  Result Value Ref Range   TSH 0.448 0.350 - 4.500 uIU/mL    Comment: Performed by a 3rd Generation assay with a functional sensitivity of <=0.01 uIU/mL. Performed at [facility], [address] [place], [place] [address]   Basic metabolic panel     Status: Abnormal   Collection Time: 03/25/19  4:03 AM  Result Value Ref Range   Sodium 139 135 - 145 mmol/L   Potassium 3.9 3.5 - 5.1 mmol/L   Chloride 107 98 - 111 mmol/L   CO2 22 22 - 32 mmol/L   Glucose, Bld 101 (H) 70 - 99 mg/dL   BUN 20 8 - 23 mg/dL   Creatinine, Ser 0.981.03 (H) 0.44 - 1.00 mg/dL   Calcium 11.910.0 8.9 - 14.710.3 mg/dL   GFR calc non Af Amer 50 (L) >60 mL/min   GFR calc Af Amer 58 (L) >60 mL/min   Anion gap 10 5 - 15    Comment: Performed at [facility], [address] [place], [place] [address]   No results found.  Pending Labs Unresulted Labs (From admission, onward)    Start     Ordered   03/24/19 2005  CBC  (heparin)  Once,   STAT    Comments: Baseline for heparin therapy IF NOT ALREADY DRAWN.  Notify MD if PLT < 100 K.    03/24/19 2007    03/24/19 2005  Creatinine, serum  (heparin)  Once,   STAT    Comments: Baseline for heparin therapy IF NOT ALREADY DRAWN.    03/24/19 2007   Signed and Held  Surgical PCR screen  (Screening)  Once,   R     Signed and Held          Vitals/Pain Today's Vitals   03/25/19 1115 03/25/19 1130 03/25/19 1145 03/25/19 1149  BP: (!) 164/60 (!) 174/53 (!) 152/59   Pulse: (!) 32 (!) 32 (!) 32   Resp: (!) 22 (!) 21 (!) 32   Temp:      TempSrc:      SpO2: 98% 97% 97%   Weight:      Height:      PainSc:    0-No pain    Isolation Precautions No active isolations  Medications Medications  sodium chloride flush (NS) 0.9 % injection 3 mL (3 mLs Intravenous Not Given 03/25/19 0521)  aspirin EC tablet 81 mg (has no administration in time range)  enalapril (VASOTEC) tablet 10 mg (has no administration in time range)  hydrochlorothiazide (MICROZIDE) capsule 12.5 mg (has no administration in time range)  cholecalciferol (VITAMIN D3) tablet 1,000 Units (has no administration in time range)  heparin injection 5,000 Units (5,000 Units Subcutaneous Given 03/25/19 0556)  0.9 %  sodium chloride infusion ( Intravenous New Bag/Given 03/25/19 0832)    Mobility walks with device     Focused Assessments Cardiac Assessment Handoff:    No results found for: CKTOTAL, CKMB, CKMBINDEX, TROPONINI No results found for: DDIMER Does the Patient currently have chest pain? No      R Recommendations: See Admitting Provider Note  Report given to:   Additional Notes:

## 2019-03-25 NOTE — ED Notes (Signed)
Cards PA at bedside. 

## 2019-03-25 NOTE — Discharge Summary (Addendum)
ELECTROPHYSIOLOGY PROCEDURE DISCHARGE SUMMARY    Patient ID: Kathryn Strickland,  MRN: [id number], DOB/AGE: 83/22/1934 83 y.o.  Admit date: 03/24/2019 Discharge date: 03/26/19   Primary Care Physician: None, pt from [place] Electrophysiologist: New to Dr. Curt Bears, lives and Tiara Bartoli be followed in [place]  Primary Discharge Diagnosis:  CHB status post pacemaker implantation this admission  Secondary Discharge Diagnosis:  R Hip osteoarthritis  No Known Allergies  Procedures This Admission:  1.  Implantation of a MDT Dual chamber PPM on 03/25/2019 by Dr Curt Bears.  The patient received a [id number] model number MDT PACE AZURE XT DR MRI PPM with model number 5076 Novus right atrial lead and 5076 Novus right ventricular lead. There were no immediate post procedure complications. 2.  CXR on 03/26/19 demonstrated no pneumothorax status post device implantation.  Brief HPI: Kathryn Strickland is a 85 y.o. female was referred to electrophysiology in the inpatient setting for consideration of PPM implantation.  Past medical history includes Right hip osteoarthritis. During routine work up for hip surgery, pt had incidental finding of CHB with junctional escape in the 30s. Pt asymptomatic. The patient has complete heart block without reversible causes identified.  Risks, benefits, and alternatives to PPM implantation were reviewed with the patient who wished to proceed.   Hospital Course:  The patient was admitted and underwent implantation of a MDT PPM with details as outlined above. She was monitored on telemetry overnight which demonstrated stable device function.  Left chest was without hematoma or ecchymosis.  The device was interrogated and found to be functioning normally.  CXR was obtained and demonstrated no pneumothorax status post device implantation.  Wound care, arm mobility, and restrictions were reviewed with the patient.  The patient was examined and considered stable for discharge to home.   Physical Exam: Vitals:   03/25/19 2200 03/25/19 2300 03/26/19 0000 03/26/19 0420  BP: (!) 131/97 140/61 (!) 148/66 (!) 122/55  Pulse: 73 72 75   Resp: 20 (!) 22 (!) 23 (!) 22  Temp:      TempSrc:      SpO2: 98% 97% 95%   Weight:      Height:        GEN- The patient is well appearing, alert and oriented x 3 today.   HEENT: normocephalic, atraumatic; sclera clear, conjunctiva pink; hearing intact; oropharynx clear; neck supple, no JVP Lymph- no cervical lymphadenopathy Lungs- Clear to ausculation bilaterally, normal work of breathing.  No wheezes, rales, rhonchi Heart- Regular rate and rhythm, no murmurs, rubs or gallops, PMI not laterally displaced GI- soft, non-tender, non-distended, bowel sounds present, no hepatosplenomegaly Extremities- no clubbing, cyanosis, or edema; DP/PT/radial pulses 2+ bilaterally MS- no significant deformity or atrophy Skin- warm and dry, no rash or lesion, left chest without hematoma/ecchymosis Psych- euthymic mood, full affect Neuro- strength and sensation are intact   Labs:   Lab Results  Component Value Date   WBC 6.9 03/24/2019   HGB 12.1 03/24/2019   HCT 39.2 03/24/2019   MCV 88.3 03/24/2019   PLT 226 03/24/2019    Recent Labs  Lab 03/25/19 0403  NA 139  K 3.9  CL 107  CO2 22  BUN 20  CREATININE 1.03*  CALCIUM 10.0  GLUCOSE 101*    Discharge Medications:  Allergies as of 03/26/2019   No Known Allergies     Medication List    TAKE these medications   aspirin EC 81 MG tablet Take 81 mg by mouth daily.   enalapril 10  MG tablet Commonly known as: VASOTEC Take 10 mg by mouth daily.   hydrochlorothiazide 12.5 MG capsule Commonly known as: MICROZIDE Take 12.5 mg by mouth daily.   Vitamin D-3 25 MCG (1000 UT) Caps Take 1,000 Units by mouth daily with breakfast.       Disposition:   Follow-up Information    [facility] Follow up on 04/07/2019.   Why: at 0930 for post  pacemaker device/wound check.  Contact information: [address] [place] [id number] [phone]          Duration of Discharge Encounter: Greater than 30 minutes including physician time.  Signed, Graciella FreerMichael Andrew Tillery, PA-C  03/26/2019 6:57 AM  I have seen and examined this patient with Otilio SaberAndy Tillery.  Agree with above, note added to reflect my findings.  On exam, RRR, no murmurs, lungs clear.  She is now status post [employer] pacemaker for CHB.  Device functioning appropriately.  Chest x-ray and interrogation without issue.  Plan for discharge today with follow-up in device clinic.  Finnbar Cedillos M. Brecken Walth MD 03/26/2019 9:54 AM

## 2019-03-25 NOTE — Consult Note (Addendum)
ELECTROPHYSIOLOGY CONSULT NOTE    Patient ID: Kathryn Strickland MRN: [id number], DOB/AGE: 1933-09-12 85 y.o.  Admit date: 03/24/2019 Date of Consult: 03/25/2019  Primary Physician: Patient, No Pcp Per Electrophysiologist: New to Dr. Elberta Fortisamnitz. Lives in [place]  Patient Profile: Kathryn Strickland is a 85 y.o. female with a history of right hip ostearthritis who is being seen today for the evaluation of sinus node dysfunction at the request of Dr. Rennis GoldenHilty.  HPI:  Kathryn Strickland is a 85 y.o. female who is visiting with her [place] from [place]. She plans to have R hip surgery here so that he can care for her post op. During routine work up, CHB was found. She is asymptomatic. She denies chest pain, palpitations, dyspnea, PND, orthopnea, nausea, vomiting, dizziness, syncope, edema, weight gain, or early satiety.  Past Medical History:  Diagnosis Date  . Hypertension      Surgical History: No past surgical history on file.   (Not in a hospital admission)   Inpatient Medications:  . aspirin EC  81 mg Oral Daily  . cholecalciferol  1,000 Units Oral Q breakfast  . enalapril  10 mg Oral Daily  . heparin  5,000 Units Subcutaneous Q8H  . hydrochlorothiazide  12.5 mg Oral Daily  . sodium chloride flush  3 mL Intravenous Once    Allergies: No Known Allergies  Social History   Socioeconomic History  . Marital status: Widowed    Spouse name: Not on file  . Number of children: Not on file  . Years of education: Not on file  . Highest education level: Not on file  Occupational History  . Not on file  Social Needs  . Financial resource strain: Not on file  . Food insecurity    Worry: Not on file    Inability: Not on file  . Transportation needs    Medical: Not on file    Non-medical: Not on file  Tobacco Use  . Smoking status: Never Smoker  . Smokeless tobacco: Never Used  Substance and Sexual Activity  . Alcohol use: Never    Frequency: Never  . Drug use: Never  . Sexual  activity: Not on file  Lifestyle  . Physical activity    Days per week: Not on file    Minutes per session: Not on file  . Stress: Not on file  Relationships  . Social Musicianconnections    Talks on phone: Not on file    Gets together: Not on file    Attends religious service: Not on file    Active member of club or organization: Not on file    Attends meetings of clubs or organizations: Not on file    Relationship status: Not on file  . Intimate partner violence    Fear of current or ex partner: Not on file    Emotionally abused: Not on file    Physically abused: Not on file    Forced sexual activity: Not on file  Other Topics Concern  . Not on file  Social History Narrative  . Not on file     Family History  Problem Relation Age of Onset  . Heart disease Father      Review of Systems: All other systems reviewed and are otherwise negative except as noted above.  Physical Exam: Vitals:   03/25/19 0055 03/25/19 0100 03/25/19 0427 03/25/19 0600  BP: (!) 149/76 (!) 152/70 (!) 138/56 (!) 154/63  Pulse: (!) 32 (!) 32 (!) 32 (!) 32  Resp: 20 20 19 19   Temp:      TempSrc:      SpO2: 98% 97% 100% 96%  Weight:      Height:        GEN- The patient is well appearing, alert and oriented x 3 today.   HEENT: normocephalic, atraumatic; sclera clear, conjunctiva pink; hearing intact; oropharynx clear; neck supple Lungs- Clear to ausculation bilaterally, normal work of breathing.  No wheezes, rales, rhonchi Heart- Regular rate and rhythm, no murmurs, rubs or gallops GI- soft, non-tender, non-distended, bowel sounds present Extremities- no clubbing, cyanosis, or edema; DP/PT/radial pulses 2+ bilaterally MS- no significant deformity or atrophy Skin- warm and dry, no rash or lesion Psych- euthymic mood, full affect Neuro- strength and sensation are intact  Labs:   Lab Results  Component Value Date   WBC 6.9 03/24/2019   HGB 12.1 03/24/2019   HCT 39.2 03/24/2019   MCV 88.3  03/24/2019   PLT 226 03/24/2019    Recent Labs  Lab 03/25/19 0403  NA 139  K 3.9  CL 107  CO2 22  BUN 20  CREATININE 1.03*  CALCIUM 10.0  GLUCOSE 101*    Radiology/Studies: No results found.  EKG: CHB with junctional escape 35 bpm (personally reviewed)  TELEMETRY: CHB with junctional escape 30-40s (personally reviewed)  Assessment/Plan: 1.  Complete heart block- Pt in CHB. Not on any AV nodal blockade. Asymptomatic. Discussed risks, benefits, and alternatives to pacemaker. Pt agrees to proceed. Per RN, pt has been pleasantly confused overnight, so Kathryn Strickland discuss with son as well.   For questions or updates, please contact [place] Please consult [email or web address] for contact info under Cardiology/STEMI.  Signed, Shirley Friar, PA-C  03/25/2019 7:04 AM     I have seen and examined this patient with Oda Kilts.  Agree with above, note added to reflect my findings.  On exam, bradycardic, no murmurs, lungs clear. Patient presented with complete heart block. No reversible causes, plan for pacemaker implant,.  Kathryn Strickland has presented today for surgery, with the diagnosis of complete heart block.  The various methods of treatment have been discussed with the patient and family. After consideration of risks, benefits and other options for treatment, the patient has consented to  Procedure(s): Pacemaker implant as a surgical intervention .  Risks include but not limited to bleeding, tamponade, infection, pneumothorax, among others. The patient's history has been reviewed, patient examined, no change in status, stable for surgery.  I have reviewed the patient's chart and labs.  Questions were answered to the patient's satisfaction.    Kathryn Mak Kathryn Bears, MD 03/25/2019 5:38 PM      Kathryn Strickland M. Kathryn Dixson MD 03/25/2019 5:37 PM

## 2019-03-25 NOTE — Progress Notes (Signed)
  Spoke with son, Aaron Edelman at [phone].  All questions answered. No problems with proceeding with PPM.

## 2019-03-25 NOTE — ED Notes (Signed)
Patient is alert and oriented , denies any complaints at present. Pillow given and comfort measures given.

## 2019-03-26 ENCOUNTER — Ambulatory Visit: Payer: Self-pay | Admitting: Cardiology

## 2019-03-26 ENCOUNTER — Inpatient Hospital Stay (HOSPITAL_COMMUNITY): Payer: Medicare Other

## 2019-03-26 ENCOUNTER — Encounter (HOSPITAL_COMMUNITY): Payer: Self-pay | Admitting: Cardiology

## 2019-03-26 NOTE — Discharge Instructions (Signed)
° ° °  Supplemental Discharge Instructions for  Pacemaker/Defibrillator Patients  Activity No heavy lifting or vigorous activity with your left/right arm for 6 to 8 weeks.  Do not raise your left/right arm above your head for one week.  Gradually raise your affected arm as drawn below.          04/02/2019                 04/03/2019                   04/04/2019                 04/05/2019   NO DRIVING for  7 days  ;  WOUND CARE - Keep the wound area clean and dry.  Do not get this area wet for one week. No showers for one week. - The tape/steri-strips on your wound will fall off; do not pull them off.  No bandage is needed on the site.  DO  NOT apply any creams, oils, or ointments to the wound area. - If you notice any drainage or discharge from the wound, any swelling or bruising at the site, or you develop a fever > 101? F after you are discharged home, call the office at once.  Special Instructions - You are still able to use cellular telephones; use the ear opposite the side where you have your pacemaker/defibrillator.  Avoid carrying your cellular phone near your device. - When traveling through airports, show security personnel your identification card to avoid being screened in the metal detectors.  Ask the security personnel to use the hand wand. - Avoid arc welding equipment, MRI testing (magnetic resonance imaging), TENS units (transcutaneous nerve stimulators).  Call the office for questions about other devices. - Avoid electrical appliances that are in poor condition or are not properly grounded. - Microwave ovens are safe to be near or to operate.  Additional information for defibrillator patients should your device go off: - If your device goes off ONCE and you feel fine afterward, notify the device clinic nurses. - If your device goes off ONCE and you do not feel well afterward, call 911. - If your device goes off TWICE, call 911. - If your device goes off THREE times in one day, call  911.  DO NOT DRIVE YOURSELF OR A FAMILY MEMBER WITH A DEFIBRILLATOR TO THE HOSPITAL--CALL 911.

## 2019-03-26 NOTE — Progress Notes (Signed)
Orthopedic Tech Progress Note Patient Details:  Kathryn Strickland [birth year] [id number] Patient has arm sling Patient ID: Kathryn Strickland, female   DOB: 1933/09/04, 85 y.o.   MRN: [id number]   [name] 03/26/2019, 8:36 AM

## 2019-03-26 NOTE — Progress Notes (Signed)
Received a call from Kathryn Strickland monitor tech that pt,s monitor leads were  & o2 sat monitor were off .Went to the room  & found her  Monitor leads & sling were out & said that it's bothering her..Explained to her the importance of the sling & the heart  monitor & placed them back .Will  Continue to monitor pt.

## 2019-04-06 ENCOUNTER — Telehealth: Payer: Self-pay

## 2019-04-06 NOTE — Telephone Encounter (Signed)
Appointment 04-07-2019 at 9:30 am.      COVID-19 Pre-Screening Questions:  . In the past 7 to 10 days have you had a cough,  shortness of breath, headache, congestion, fever (100 or greater) body aches, chills, sore throat, or sudden loss of taste or sense of smell? No . Have you been around anyone with known Covid 19. No . Have you been around anyone who is awaiting Covid 19 test results in the past 7 to 10 days? Grandson tested 10 days ago and it was negative . Have you been around anyone who has been exposed to Covid 19, or has mentioned symptoms of Covid 19 within the past 7 to 10 days? No  If you have any concerns/questions about symptoms patients report during screening (either on the phone or at threshold). Contact the provider seeing the patient or DOD for further guidance.  If neither are available contact a member of the leadership team.       Pt son answered No to all Covid-19 prescreening questions. I let him know that we are limiting the amount of people into the office and if she can physically come alone to please do so. I also asked him to have the pt to wear her own mask if she has one. I told him if anything should change between now and the pt appointment time to please call and let us know. The pt son verbalized understanding.

## 2019-04-07 ENCOUNTER — Other Ambulatory Visit: Payer: Self-pay

## 2019-04-07 ENCOUNTER — Ambulatory Visit (INDEPENDENT_AMBULATORY_CARE_PROVIDER_SITE_OTHER): Payer: Medicare Other | Admitting: *Deleted

## 2019-04-07 DIAGNOSIS — I4891 Unspecified atrial fibrillation: Secondary | ICD-10-CM

## 2019-04-07 DIAGNOSIS — I442 Atrioventricular block, complete: Secondary | ICD-10-CM | POA: Diagnosis not present

## 2019-04-07 HISTORY — DX: Unspecified atrial fibrillation: I48.91

## 2019-04-07 LAB — CUP PACEART INCLINIC DEVICE CHECK
Battery Remaining Longevity: 135 mo
Battery Voltage: 3.18 V
Brady Statistic AP VP Percent: 7.78 %
Brady Statistic AP VS Percent: 0 %
Brady Statistic AS VP Percent: 91.91 %
Brady Statistic AS VS Percent: 0.24 %
Brady Statistic RA Percent Paced: 6.99 %
Brady Statistic RV Percent Paced: 99.75 %
Date Time Interrogation Session: 20200707132102
Implantable Lead Implant Date: 20200624
Implantable Lead Implant Date: 20200624
Implantable Lead Location: 753859
Implantable Lead Location: 753860
Implantable Lead Model: 5076
Implantable Lead Model: 5076
Implantable Pulse Generator Implant Date: 20200624
Lead Channel Impedance Value: 323 Ohm
Lead Channel Impedance Value: 342 Ohm
Lead Channel Impedance Value: 456 Ohm
Lead Channel Impedance Value: 494 Ohm
Lead Channel Pacing Threshold Amplitude: 0.625 V
Lead Channel Pacing Threshold Amplitude: 0.75 V
Lead Channel Pacing Threshold Pulse Width: 0.4 ms
Lead Channel Pacing Threshold Pulse Width: 0.4 ms
Lead Channel Sensing Intrinsic Amplitude: 3 mV
Lead Channel Sensing Intrinsic Amplitude: 3.25 mV
Lead Channel Setting Pacing Amplitude: 3.5 V
Lead Channel Setting Pacing Amplitude: 3.5 V
Lead Channel Setting Pacing Pulse Width: 0.4 ms
Lead Channel Setting Sensing Sensitivity: 2.8 mV

## 2019-04-07 MED ORDER — RIVAROXABAN 20 MG PO TABS: 20.0000 mg | ORAL_TABLET | Freq: Every day | ORAL | 6 refills | Status: DC

## 2019-04-07 NOTE — Progress Notes (Signed)
Wound check appointment. Steri-strips removed. Wound without redness or edema. Incision edges approximated, wound well healed. Normal device function. Thresholds, sensing, impedances consistent with previous measurements. Device programmed to maximize longevity. No high ventricular rates noted. 24 AT/AF episodes (11.8% burden), available EGMs suggest AF (longest duration 7hr), pt started on Xarelto 04/07/19. AT/AF daily burden alert turned on, burden time increased to 24 hr. Device programmed at appropriate safety margins. Histogram distribution appropriate for patient activity level. Estimated longevity 11.2 years. Patient enrolled in remote follow-up. Plan to follow every 3 months remotely and ROV w/ WC 06/23/19. Patient education completed. 

## 2019-04-08 ENCOUNTER — Encounter: Payer: Self-pay | Admitting: Internal Medicine

## 2019-04-09 ENCOUNTER — Telehealth: Payer: Self-pay | Admitting: *Deleted

## 2019-04-09 NOTE — Telephone Encounter (Signed)
Pt has not been seen by a provider at [facility] - per previous clearance request 03/19/19, pt was establishing with Dr Virgina Jock with [facility]. It does look as though she will see Dr Curt Bears in September but this is a device visit. Unsure if Xarelto clearance can be provided at this time.

## 2019-04-09 NOTE — Telephone Encounter (Signed)
   [facility] Pre-operative Risk Assessment    Request for surgical clearance:  1. What type of surgery is being performed? LEFT TOTAL HIP REPLACEMENT ; DR. Percell Miller WOULD LIKE TO KNOW HOW SOON AFTER PPM IMPLANT DONE ON 03/25/19 WOULD BE SAFE TO PROCEED WITH SURGERY  2. When is this surgery scheduled? TBD  3. What type of clearance is required (medical clearance vs. Pharmacy clearance to hold med vs. Both)? BOTH  4. Are there any medications that need to be held prior to surgery and how long? [address]  5. Practice name and name of physician performing surgery? [facility]; DR. Percell Miller  6. What is your office phone number [phone] EXT [phone]   7.   What is your office fax number [phone]  8.   Anesthesia type (None, local, MAC, general) ? SPINAL   Julaine Hua 04/09/2019, 3:10 PM  _________________________________________________________________   (provider comments below)

## 2019-04-13 ENCOUNTER — Ambulatory Visit: Payer: Self-pay | Admitting: Cardiovascular Disease

## 2019-04-13 NOTE — Telephone Encounter (Signed)
Please note this is not [facility] pt - she is followed by Dr. Robb Matar with [facility] they would have to give clearance.

## 2019-04-14 ENCOUNTER — Telehealth: Payer: Self-pay | Admitting: Cardiology

## 2019-04-14 DIAGNOSIS — I10 Essential (primary) hypertension: Secondary | ICD-10-CM

## 2019-04-14 DIAGNOSIS — I442 Atrioventricular block, complete: Secondary | ICD-10-CM

## 2019-04-14 NOTE — Telephone Encounter (Signed)
Also Dr. Debroah Loop office wanted [place] to clear her for hip surgery.  I thought she was Dr. Bonney Roussel pt but she has never seen she was in hospital getting PPM when she had her appt. .   So bottom line are you comfortable clearing her for surgery?   If not I will have her see gen. Cards with [place].  But please address when from [facility] perspective she could have her hip done.  Thanks.

## 2019-04-14 NOTE — Telephone Encounter (Signed)
Spoke with patient's son Aaron Edelman (DPR on file) about patient needing appointment and Echo to be cleared for surgery. Patient is scheduled with Dr. Acie Fredrickson on 7/17 at 2:40 pm. Orders for for Echo have been placed into epic. Patient's son verbalized understanding and thanked me for the call.

## 2019-04-14 NOTE — Telephone Encounter (Signed)
Dr. Curt Bears pt had PPM inserted 03/25/19 and would like to have Lt total hip replacement.  How far out from [facility] does pt need to be for hip surgery?  All other questions about cardiology clearance have been directed to Dr. Virgina Jock.  Just send answer to pre-op pool thanks.

## 2019-04-14 NOTE — Telephone Encounter (Signed)
Please make appt for echo and App or Bells appt for pre-op clearance  Should be done in next 1-2 weeks

## 2019-04-14 NOTE — Telephone Encounter (Signed)
Pt needs pre-op eval for hip surgery.  Also an echo maybe both could be done same day.   But needs to have done in 1-2 weeks for hip surgery.  Thanks.

## 2019-04-14 NOTE — Telephone Encounter (Signed)
Spoke with patient's son Brian (DPR on file) about patient needing appointment and Echo to be cleared for surgery. Patient is scheduled with Dr. Nahser on 7/17 at 2:40 pm. Orders for for Echo have been placed into epic. Patient's son verbalized understanding and thanked me for the call. 

## 2019-04-14 NOTE — Telephone Encounter (Signed)
She needs echo and follow-up before being able to be cleared for surgery.  Dr Lemmie Evens

## 2019-04-14 NOTE — Telephone Encounter (Signed)
Dr. Debara Pickett you saw pt in hospital before Dr. Curt Bears you mention echo but it was not done.  Now ortho wants to know if cleared for surgery - but it looks like she needs appt to really go over that - do you agree?  Any recommendations you have will be helpful.  I feel like we just took care of [facility] not really evaluated for surgery.

## 2019-04-14 NOTE — Addendum Note (Signed)
Addended by: Jacinta Shoe on: 04/14/2019 03:30 PM   Modules accepted: Orders

## 2019-04-14 NOTE — Telephone Encounter (Signed)
Patient at intermediate risk for intermediate risk procedure. Would prefer if procedure occurred 1 month post pacemaker placement. No med changes needed prior to pacemaker implant.

## 2019-04-15 ENCOUNTER — Telehealth: Payer: Self-pay | Admitting: Cardiovascular Disease

## 2019-04-15 NOTE — Telephone Encounter (Signed)
This patient is on Dr. Elmarie Shiley schedule for Friday 7/17 as a new patient. There is documentation in the chart from Dr. Debara Pickett and Dr. Curt Bears who treated the patient in the hospital regarding this patient's clearance. The patient should have been scheduled with Dr. Debara Pickett for clearance.

## 2019-04-15 NOTE — Telephone Encounter (Signed)
Agree with note by Christen Bame, RN I have talked to Dr. Debara Pickett who saw this patients several weeks ago in the hospital.  He will review the echo and comment on cardiac clearence. Will remove from my schedule    She will follow up in the future with dr. Debara Pickett and dr. Curt Bears

## 2019-04-15 NOTE — Telephone Encounter (Signed)
New Message         COVID-19 Pre-Screening Questions:   In the past 7 to 10 days have you had a cough,  shortness of breath, headache, congestion, fever (100 or greater) body aches, chills, sore throat, or sudden loss of taste or sense of smell? NO  Have you been around anyone with known Covid 19. NO  Have you been around anyone who is awaiting Covid 19 test results in the past 7 to 10 days? Pt was around someone last Thursday who is awaiting for their test results  Have you been around anyone who has been exposed to Covid 19, or has mentioned symptoms of Covid 19 within the past 7 to 10 days? NO Pts son will be assisting pt to appt and he also has been around the person that is awaiting test results   If you have any concerns/questions about symptoms patients report during screening (either on the phone or at threshold). Contact the provider seeing the patient or DOD for further guidance.  If neither are available contact a member of the leadership team.

## 2019-04-16 ENCOUNTER — Encounter: Payer: Self-pay | Admitting: Cardiovascular Disease

## 2019-04-16 ENCOUNTER — Other Ambulatory Visit: Payer: Self-pay

## 2019-04-16 ENCOUNTER — Ambulatory Visit (HOSPITAL_COMMUNITY): Payer: Medicare Other | Attending: Internal Medicine

## 2019-04-16 DIAGNOSIS — I442 Atrioventricular block, complete: Secondary | ICD-10-CM | POA: Diagnosis not present

## 2019-04-16 DIAGNOSIS — I10 Essential (primary) hypertension: Secondary | ICD-10-CM | POA: Insufficient documentation

## 2019-04-17 ENCOUNTER — Ambulatory Visit: Payer: Medicare Other | Admitting: Cardiovascular Disease

## 2019-04-17 ENCOUNTER — Telehealth: Payer: Self-pay | Admitting: *Deleted

## 2019-04-17 NOTE — Telephone Encounter (Addendum)
Spoke with patients son Kathryn Strickland, per dpr, informed him that I was calling to schedule his mom an appt for next week. I originally was going to schedule the patient with Dr Debara Pickett but he did not have any open slots. I offered an appt with Kathryn Strickland but Son wanted a 4 pm slot. Patient was scheduled for Monday(04/20/19) at 4 pm with Kathryn Strickland and Dr Debara Pickett is DOD so he can step in the room if needed. Patient's son stated they had an appt today with Dr Acie Fredrickson, but it was canceled and he does know why. I explained that his mom is a patient of Dr Debara Pickett and Dr Curt Bears and somehow the appt was scheduled in error. I then apologized for the inconvenience, and stated  we will get things done correctly going forward. I explained that patient son that he can come back with his mom due to her age and I would notify the front office. Informed Kathryn Strickland that they both need to wear a mask to enter the building. Asked covid screening questions. He answered no to all the questions for him and his mom.         COVID-19 Pre-Screening Questions:  . In the past 7 to 10 days have you had a cough,  shortness of breath, headache, congestion, fever (100 or greater) body aches, chills, sore throat, or sudden loss of taste or sense of smell?NO . Have you been around anyone with known Covid 19. NO . Have you been around anyone who is awaiting Covid 19 test results in the past 7 to 10 days? NO . Have you been around anyone who has been exposed to Covid 19, or has mentioned symptoms of Covid 19 within the past 7 to 10 days?NO  If you have any concerns/questions about symptoms patients report during screening (either on the phone or at threshold). Contact the provider seeing the patient or DOD for further guidance.  If neither are available contact a member of the leadership team.

## 2019-04-17 NOTE — Telephone Encounter (Signed)
I'm not sure if she has been scheduled - Mickel Baas somehow put her on for a new pt appt with Dr. Acie Fredrickson, who has never seen him. I cancelled that. Looked at her echo yesterday, LVEF is down with apical WMA - there is also a trivial to small posterior pericardial effusion, ?related to recent pacer - a repeat limited echo may be necessary in 1 week. I will include Will on this as well. Dr. Alvester Morin office has requested clearance for surgery, however, she is not in condition for that yet - Will recommended no sooner than 1 month after pacer or until we determine the cause of her cardiomyopathy and pericardial effusion.   Please get her in to be seen in the [facility] office by me or you. Will, can you review the echo?  Thanks.  -Mali

## 2019-04-17 NOTE — Telephone Encounter (Signed)
Monday is great, thanks. I'll chat with [name] about her on Monday.  Dr Lemmie Evens

## 2019-04-17 NOTE — Telephone Encounter (Signed)
Dr. Debara Pickett has said pt needs appt for surgical clearance --please arrange appt thanks.

## 2019-04-17 NOTE — Telephone Encounter (Signed)
MD to review echo  Message was sent to [facility] scheduling pool to arrange MD or APP follow up for clearance per notes

## 2019-04-17 NOTE — Telephone Encounter (Signed)
Patient scheduled for PA OV on 04/20/2019

## 2019-04-17 NOTE — Telephone Encounter (Signed)
Was this patient ever contacted for to make an appointment?  [name] PA-C 04/17/2019 10:57 AM

## 2019-04-17 NOTE — Telephone Encounter (Signed)
Left message for patient to call and schedule pre clearance visit within the next 2 weeks  With Dr. Debara Pickett or an APP per 04/17/19 staff message from Sheral Apley, RN

## 2019-04-17 NOTE — Telephone Encounter (Signed)
Spoke with pt son regarding Dr. Elmarie Shiley statement below. [name] son understands her echo will be reviewed by Dr Debara Pickett and he will address her surgical clearance. Pts appt was cancelled with Dr. Acie Fredrickson on 7/17.  He agrees and will await her surgical clearance.

## 2019-04-17 NOTE — Telephone Encounter (Signed)
[place] plan for CXR to evaluate pacemaker.

## 2019-04-20 ENCOUNTER — Telehealth: Payer: Self-pay | Admitting: Cardiology

## 2019-04-20 ENCOUNTER — Ambulatory Visit (INDEPENDENT_AMBULATORY_CARE_PROVIDER_SITE_OTHER): Payer: Medicare Other | Admitting: Cardiology

## 2019-04-20 ENCOUNTER — Encounter: Payer: Self-pay | Admitting: Cardiology

## 2019-04-20 ENCOUNTER — Ambulatory Visit
Admission: RE | Admit: 2019-04-20 | Discharge: 2019-04-20 | Disposition: A | Discharge status: Home / Self Care | Payer: Medicare Other | Source: Ambulatory Visit | Attending: Cardiology | Admitting: Cardiology

## 2019-04-20 ENCOUNTER — Telehealth: Payer: Self-pay

## 2019-04-20 ENCOUNTER — Other Ambulatory Visit: Payer: Self-pay

## 2019-04-20 VITALS — BP 140/76 | HR 88 | Temp 98.8°F | Ht 68.0 in | Wt 114.4 lb

## 2019-04-20 DIAGNOSIS — I313 Pericardial effusion (noninflammatory): Secondary | ICD-10-CM

## 2019-04-20 DIAGNOSIS — I10 Essential (primary) hypertension: Secondary | ICD-10-CM

## 2019-04-20 DIAGNOSIS — I3139 Other pericardial effusion (noninflammatory): Secondary | ICD-10-CM

## 2019-04-20 DIAGNOSIS — I442 Atrioventricular block, complete: Secondary | ICD-10-CM | POA: Diagnosis not present

## 2019-04-20 DIAGNOSIS — N183 Chronic kidney disease, stage 3 unspecified: Secondary | ICD-10-CM

## 2019-04-20 DIAGNOSIS — I429 Cardiomyopathy, unspecified: Secondary | ICD-10-CM | POA: Insufficient documentation

## 2019-04-20 HISTORY — DX: Cardiomyopathy, unspecified: I42.9

## 2019-04-20 HISTORY — DX: Chronic kidney disease, stage 3 unspecified: N18.30

## 2019-04-20 NOTE — Telephone Encounter (Signed)
New Message:    Pt''s son says he have to come in with pt for her appointment today. Pt have memory problems.

## 2019-04-20 NOTE — Telephone Encounter (Signed)
Per Dr Curt Bears patient needs CXR.  Per [name], PA-C I left message for patients son Kathryn Strickland informing him that patient needs to have CXR today before her appt. Levada Schilling to contact our office with any questions or concerns.

## 2019-04-20 NOTE — Assessment & Plan Note (Signed)
EF 45% with apical WMA- no history of CAD or symptoms of angina

## 2019-04-20 NOTE — Telephone Encounter (Signed)
I called pt to confirm her appt for 04-20-19.

## 2019-04-20 NOTE — Assessment & Plan Note (Signed)
GFR 50 

## 2019-04-20 NOTE — Assessment & Plan Note (Signed)
Controlled.  

## 2019-04-20 NOTE — Telephone Encounter (Signed)
Patients son called back. Returning his call and he stated his wife was taking the patient to have her CXR now and they would be at their appt later.

## 2019-04-20 NOTE — Telephone Encounter (Signed)
F/U Message ° ° ° ° ° ° ° ° ° ° ° °Patient's son is returning Tee's call would like a call back. °

## 2019-04-20 NOTE — Telephone Encounter (Signed)
Returned the call to the patient's son. He will take his mother to get the chest x-ray completed prior to the appointment.

## 2019-04-20 NOTE — Assessment & Plan Note (Signed)
S/P PTVDP 03/25/2019 Small pericardial effusion post op

## 2019-04-20 NOTE — Progress Notes (Signed)
Cardiology Office Note:    Date:  04/20/2019   ID:  Kathryn Strickland, DOB 01-Dec-1932, MRN [id number]  PCP:  Philip AspenHernandez Acosta, Limmie PatriciaEstela Y, MD  Cardiologist:  Dr Rennis GoldenHilty Electrophysiologist:  Dr Elberta Fortisamnitz  Referring MD: Philip AspenHernandez Acosta, Estel*   Chief Complaint  Patient presents with  . other    Surgical Clearance.Medications reviewed verbally.    History of Present Illness:    Kathryn Strickland is a pleasant 85 y.o. [place]  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  She was told she needed to have hip surgery and her [place] [facility] [place] [facility] as he was familiar with an orthopedic group here.  He also admitted to me he has been concerned about his mother short term memory and was concerned about her post surgical care.   The patient denies any history of CAD, CHF, MI, or prior cardiac testing.  Her son notes that her activity have been very limiited secondary to her hip. When she went to see Dr Philip AspenHernandez Acosta for pre op clearance she was found to be in [facility].  She was asymptomatic with this.  [facility] and did have a pacemaker implanted 03/25/2019.    Dr Elberta Fortisamnitz recommended waiting 1 month before proceeding with hip surgery.  Follow up echo done 04/16/2019 showed small pericardial effusion -? related to recent pacemaker- an apical WMA, and an EF of 45%.  Dr Elberta Fortisamnitz recommended a PA and Lat CXR and the patient was placed on my schedule today for follow up.   She is in the office accompanied by her son.  The patient denies any chest pain, SOB, or orthopnea.  I reviewed her CXR with Dr Rennis GoldenHilty, the leads appear to be in appropriate position.  Her heart does look enlarged on the lateral view.  She has no CHF on exam.   Past Medical History:  Diagnosis Date  . Carotid bruit    LATERAL UNSPECIFIED  . Chronic pain   . Complete heart block (HCC) 03/2019  . Genital disorder, female   . Hypercalcemia   . Hypercholesterolemia   . Hyperlipidemia    . Hypertension   . Osteoporosis   . Postmenopausal   . Vitamin D deficiency     Past Surgical History:  Procedure Laterality Date  . COLONOSCOPY    . PACEMAKER IMPLANT N/A 03/25/2019   Procedure: PACEMAKER IMPLANT;  Surgeon: Regan Lemmingamnitz, Will Martin, MD;  Location: [facility] INVASIVE CV LAB;  Service: Cardiovascular;  Laterality: N/A;    Current Medications: Current Meds  Medication Sig  . Cholecalciferol (VITAMIN D-3) 25 MCG (1000 UT) CAPS Take 1,000 Units by mouth daily with breakfast.  . enalapril (VASOTEC) 10 MG tablet Take 10 mg by mouth daily.  . hydrochlorothiazide (MICROZIDE) 12.5 MG capsule Take 12.5 mg by mouth daily.  . Multiple Vitamins-Minerals (PRESERVISION AREDS 2) CAPS Take by mouth.  . rivaroxaban (XARELTO) 20 MG TABS tablet Take 1 tablet (20 mg total) by mouth daily with supper.     Allergies:   Patient has no known allergies.   Social History   Socioeconomic History  . Marital status: Widowed    Spouse name: Not on file  . Number of children: Not on file  . Years of education: Not on file  . Highest education level: Not on file  Occupational History  . Not on file  Social Needs  . Financial resource strain: Not on file  .  Food insecurity    Worry: Not on file    Inability: Not on file  . Transportation needs    Medical: Not on file    Non-medical: Not on file  Tobacco Use  . Smoking status: Never Smoker  . Smokeless tobacco: Never Used  Substance and Sexual Activity  . Alcohol use: Never    Frequency: Never  . Drug use: Never  . Sexual activity: Not on file  Lifestyle  . Physical activity    Days per week: Not on file    Minutes per session: Not on file  . Stress: Not on file  Relationships  . Social Musicianconnections    Talks on phone: Not on file    Gets together: Not on file    Attends religious service: Not on file    Active member of club or organization: Not on file    Attends meetings of clubs or organizations: Not on file    Relationship  status: Not on file  Other Topics Concern  . Not on file  Social History Narrative  . Not on file     Family History: The patient's family history includes CAD (age of onset: 6370) in her father; Colon cancer (age of onset: 4370) in her mother; Heart disease in her father.  ROS:   Please see the history of present illness.     All other systems reviewed and are negative.  EKGs/Labs/Other Studies Reviewed:    The following studies were reviewed today: CXR 04/20/2019  Recent Labs: 03/24/2019: Hemoglobin 12.1; Platelets 226; TSH 0.448 03/25/2019: BUN 20; Creatinine, Ser 1.03; Potassium 3.9; Sodium 139  Recent Lipid Panel No results found for: CHOL, TRIG, HDL, CHOLHDL, VLDL, LDLCALC, LDLDIRECT  Physical Exam:    VS:  BP 140/76 (BP Location: Left Arm, Patient Position: Sitting, Cuff Size: Normal)   Pulse 88   Temp 98.8 F (37.1 C) (Temporal)   Ht 5\' 8"  (1.727 m)   Wt 114 lb 6.4 oz (51.9 kg)   SpO2 97%   BMI 17.39 kg/m     Wt Readings from Last 3 Encounters:  04/20/19 114 lb 6.4 oz (51.9 kg)  03/25/19 113 lb 4.8 oz (51.4 kg)  03/24/19 118 lb 14.4 oz (53.9 kg)     GEN: Elderly, thin, frail female in no acute distress HEENT: Normal NECK: No JVD; No carotid bruits LYMPHATICS: No lymphadenopathy CARDIAC: RRR, no murmurs, rubs, gallops RESPIRATORY:  Clear to auscultation without rales, wheezing or rhonchi  ABDOMEN: Soft, non-tender, non-distended MUSCULOSKELETAL:  trace edema; No deformity  SKIN: Warm and dry NEUROLOGIC:  Alert and oriented x 3 PSYCHIATRIC:  Normal affect   ASSESSMENT:    Complete heart block (HCC) S/P PTVDP 03/25/2019 Small pericardial effusion post op  Cardiomyopathy (HCC) EF 45% with apical WMA- no history of CAD or symptoms of angina  Hypertension Controlled  CRI (chronic renal insufficiency), stage 3 (moderate) (HCC) GFR-50  PLAN:    Discussed with Dr Rennis GoldenHilty- will obtain follow up echo in 1-2 weeks.  I did not think an ischemic work up was  indicated but that may change based on her follow up echo.    Medication Adjustments/Labs and Tests Ordered: Current medicines are reviewed at length with the patient today.  Concerns regarding medicines are outlined above.  Orders Placed This Encounter  Procedures  . ECHOCARDIOGRAM COMPLETE   No orders of the defined types were placed in this encounter.   Patient Instructions  Medication Instructions:  Your physician recommends that you  continue on your current medications as directed. Please refer to the Current Medication list given to you today. If you need a refill on your cardiac medications before your next appointment, please call your pharmacy.   Lab work: NONE  If you have labs (blood work) drawn today and your tests are completely normal, you will receive your results only by: Marland Kitchen MyChart Message (if you have MyChart) OR . A paper copy in the mail If you have any lab test that is abnormal or we need to change your treatment, we will call you to review the results.  Testing/Procedures: Your physician has requested that you have an echocardiogram. Echocardiography is a painless test that uses sound waves to create images of your heart. It provides your doctor with information about the size and shape of your heart and how well your heart's chambers and valves are working. This procedure takes approximately one hour. There are no restrictions for this procedure. SCHEDULE IN 1-2 WEEKS  [address]  Follow-Up: At [facility], you and your health needs are our priority.  As part of our continuing mission to provide you with exceptional heart care, we have created designated Provider Care Teams.  These Care Teams include your primary Cardiologist (physician) and Advanced Practice Providers (APPs -  Physician Assistants and Nurse Practitioners) who all work together to provide you with the care you need, when you need it. You will need a follow up appointment in 2  weeks.  Please call our office 2 months in advance to schedule this appointment.  You may see Dr Chrissie Noa "Mali" Hilty or one of the following Advanced Practice Providers on your designated Care Team: [place], [place] . Fabian Sharp, PA-C  Any Other Special Instructions Will Be Listed Below (If Applicable).      Signed, [name], PA-C  04/20/2019 5:18 PM    [facility]

## 2019-04-20 NOTE — Patient Instructions (Signed)
Medication Instructions:  Your physician recommends that you continue on your current medications as directed. Please refer to the Current Medication list given to you today. If you need a refill on your cardiac medications before your next appointment, please call your pharmacy.   Lab work: NONE  If you have labs (blood work) drawn today and your tests are completely normal, you will receive your results only by: Marland Kitchen MyChart Message (if you have MyChart) OR . A paper copy in the mail If you have any lab test that is abnormal or we need to change your treatment, we will call you to review the results.  Testing/Procedures: Your physician has requested that you have an echocardiogram. Echocardiography is a painless test that uses sound waves to create images of your heart. It provides your doctor with information about the size and shape of your heart and how well your heart's chambers and valves are working. This procedure takes approximately one hour. There are no restrictions for this procedure. SCHEDULE IN 1-2 WEEKS  [place] [address]  Follow-Up: At [facility], you and your health needs are our priority.  As part of our continuing mission to provide you with exceptional heart care, we have created designated Provider Care Teams.  These Care Teams include your primary Cardiologist (physician) and Advanced Practice Providers (APPs -  Physician Assistants and Nurse Practitioners) who all work together to provide you with the care you need, when you need it. You will need a follow up appointment in 2 weeks.  Please call our office 2 months in advance to schedule this appointment.  You may see Dr Chrissie Noa "Mali" Hilty or one of the following Advanced Practice Providers on your designated Care Team: [place], [place] . Fabian Sharp, PA-C  Any Other Special Instructions Will Be Listed Below (If Applicable).

## 2019-04-20 NOTE — Telephone Encounter (Signed)
F/U Message            Patient's son is returning Kathryn Strickland's call would like a call back.

## 2019-04-21 ENCOUNTER — Telehealth (HOSPITAL_COMMUNITY): Payer: Self-pay | Admitting: Radiology

## 2019-04-21 NOTE — Telephone Encounter (Signed)
Left message to call office-Patient needs to schedule an echocardiogram.  

## 2019-04-29 NOTE — Telephone Encounter (Signed)
Forwarding to Dr. Curt Bears to ensure he has seen CXR result from 7/20

## 2019-04-30 ENCOUNTER — Telehealth: Payer: Self-pay

## 2019-04-30 NOTE — Telephone Encounter (Signed)
   [facility] Pre-operative Risk Assessment    Request for surgical clearance:  1. What type of surgery is being performed? LEFT TOTAL HIP   2. When is this surgery scheduled? TBD   3. What type of clearance is required (medical clearance vs. Pharmacy clearance to hold med vs. Both)?  BOTH-PPM 03-25-2019  4. Are there any medications that need to be held prior to surgery and how long? [name]   5. Practice name and name of physician performing surgery? [facility] ATTN:KELLY TIMOTHY MURPHY,MD  6. What is your office phone number [phone]    7.   What is your office fax number [phone]  8.   Anesthesia type (None, local, MAC, general) ?  SPINAL  HOW SOON AFTER PPM IMPLANT WOULD IT BE SAFE TO PROCEED WITH SURGERY?  Kathryn Strickland 04/30/2019, 9:59 AM  _________________________________________________________________   (provider comments below)

## 2019-04-30 NOTE — Telephone Encounter (Signed)
   Primary Cardiologist: Pixie Casino, MD  EP: Dr. Curt Bears  Chart reviewed as part of pre-operative protocol coverage. Patient was contacted 04/30/2019 in reference to pre-operative risk assessment for pending surgery as outlined below.  Kathryn Strickland was last seen on 04/20/19 by [name], PA-C. She has history of HTN, DJD, possible memory issues, recent CHB s/p PPM. She had moved to [facility] per her son's request as he was familiar with a surgical ortho group here. He also noted concerns about her memory as well. At pre-op hip appt with PCP, was noted to be in complete heart block so sent to [facility] and underwent PPM 03/25/19.   Our office was then contacted about preop clearance. From EP standpoint, Dr. Curt Bears recommended waiting 1 month after pacer before proceeding with hip surgery. However, she was asked to have an echocardiogram for pre-op eval who showed EF mildly depressed at 45% with apical hypokinesis and trivial pericardial effusion. She saw [name] in f/u 04/20/2019 and was asymptomatic from cardiac standpoint but f/u echo planned in 1-2 weeks to decide on ischemic workup. Echo is planned for 8/4, with f/u arranged with Dr. Debara Pickett on 8/31.  I think we likely need to await f/u echo and subsequent recommendations, and have her primary cardiologist weigh in on pre-op clearance before finalizing this. Will route to Dr. Debara Pickett for input on whether he agrees. We can keep this in the pre-op box for peripheral surveillance.  [name], PA-C 04/30/2019, 10:19 AM

## 2019-05-05 ENCOUNTER — Other Ambulatory Visit: Payer: Self-pay

## 2019-05-05 ENCOUNTER — Ambulatory Visit (HOSPITAL_COMMUNITY): Payer: Medicare Other | Attending: Cardiovascular Disease

## 2019-05-05 DIAGNOSIS — I313 Pericardial effusion (noninflammatory): Secondary | ICD-10-CM | POA: Diagnosis not present

## 2019-05-05 DIAGNOSIS — I10 Essential (primary) hypertension: Secondary | ICD-10-CM | POA: Diagnosis not present

## 2019-05-05 DIAGNOSIS — I442 Atrioventricular block, complete: Secondary | ICD-10-CM | POA: Diagnosis not present

## 2019-05-06 MED ORDER — APIXABAN 2.5 MG PO TABS: 2.5000 mg | ORAL_TABLET | Freq: Two times a day (BID) | ORAL | 5 refills | Status: DC

## 2019-05-06 NOTE — Telephone Encounter (Signed)
   Primary Cardiologist: Pixie Casino, MD  Chart reviewed as part of pre-operative protocol coverage. Given past medical history and time since last visit, based on ACC/AHA guidelines, Orville Widmann would be at acceptable risk for the planned procedure without further cardiovascular testing.   She was recently evaluated in office by [name], PA-C and recommended for an echocardiogram which revealed improvement in EF to 60-65%, therefore clearing patient for surgery without further cardiac work-up.   Pharmacy reviewed patients medications and transitioned her from xarelto to apixaban with recommendations to hold apixaban 3 days prior to her upcoming procedure. She should restart apixaban when cleared to do so by her surgeon.   I will route this recommendation and the echocardiogram report where [name], PA-C provided clearance to the requesting party via Epic fax function and remove from pre-op pool.  Please call with questions.  Abigail Butts, PA-C 05/06/2019, 3:22 PM

## 2019-05-06 NOTE — Telephone Encounter (Addendum)
On review patient is on the incorrect Xarelto dose for her renal function. She should be on 15mg  daily. However her renal function is very borderline and if it were to decline much further it would not be recommended to remain on Xarelto. Can technically use with a crcl up to 29ml/min however patients with a crcl <30 were not studied in clinical trial and are at increased risk of bleeding. I will route to Dr. Curt Bears on his opinion to switching to Elqiuis where does would be 2.5mg  BID and fluctuations in renal function would not affect this dose.I believe this would be a safer option for patient based on available data.

## 2019-05-06 NOTE — Telephone Encounter (Signed)
Patient with diagnosis of afib now on Elqiuis for anticoagulation.    Procedure:  LEFT TOTAL HIP  Date of procedure: TBD  CHADS2-VASc score of  4 (HTN, AGE, AGE, female)  CrCl 33.9 ml/min  Per office protocol patient may hold Eliquis for 3 days prior to procedure.

## 2019-05-06 NOTE — Telephone Encounter (Signed)
Yes please do. Thank you

## 2019-05-06 NOTE — Telephone Encounter (Signed)
Spoke with patients son about the concerns below. In agreement to switch to Eliquis. Provided instructions on how to switch. Start Eliquis at the time the next Xarelto dose is due. Rx for Eliquis 2.5mg  BID sent to [facility] on [address] per son request.

## 2019-05-18 DIAGNOSIS — M1611 Unilateral primary osteoarthritis, right hip: Secondary | ICD-10-CM | POA: Diagnosis present

## 2019-05-18 HISTORY — DX: Unilateral primary osteoarthritis, right hip: M16.11

## 2019-05-18 NOTE — H&P (Signed)
HIP ARTHROPLASTY ADMISSION H&P  Patient ID: Kathryn Strickland MRN: [id number] DOB/AGE: 03-19-1933 85 y.o.  Chief Complaint: right hip pain.  Planned Procedure Date: 06/09/19 Medical Clearance by Dr. Ardyth HarpsHernandez   Cardiac Clearance by Dr. Rennis GoldenHilty   HPI: Kathryn Strickland is a 85 y.o. female with a history of recent pacemaker placement for complete heart block, vitamin D deficiency, hyperlipidemia, hypertension who presents for evaluation of OA RIGHT HIP. The patient has a history of pain and functional disability in the right hip due to arthritis and has failed non-surgical conservative treatments for greater than 12 weeks to include NSAID's and/or analgesics, use of assistive devices and activity modification.  Onset of symptoms was gradual, starting >10 years ago with gradually worsening course since that time.   Patient currently rates pain at 9 out of 10 with activity. Patient has night pain, worsening of pain with activity and weight bearing, pain that interferes with activities of daily living and pain with passive range of motion.  Patient has evidence of subchondral cysts, subchondral sclerosis, periarticular osteophytes and joint space narrowing by imaging studies.  There is significant femoral head collapse, femoral shortening and wear of her superior acetabulum.   There is no active infection.  Past Medical History:  Diagnosis Date  . Carotid bruit    LATERAL UNSPECIFIED  . Chronic pain   . Complete heart block (HCC) 03/2019  . Genital disorder, female   . Hypercalcemia   . Hypercholesterolemia   . Hyperlipidemia   . Hypertension   . Osteoporosis   . Postmenopausal   . Vitamin D deficiency    Past Surgical History:  Procedure Laterality Date  . COLONOSCOPY    . PACEMAKER IMPLANT N/A 03/25/2019   Procedure: PACEMAKER IMPLANT;  Surgeon: Regan Lemmingamnitz, Will Martin, MD;  Location: [facility];  Service: Cardiovascular;  Laterality: N/A;   No Known Allergies   Prior to  Admission medications   Medication Sig Start Date End Date Taking? Authorizing Provider  apixaban (ELIQUIS) 2.5 MG TABS tablet Take 1 tablet (2.5 mg total) by mouth 2 (two) times daily. 05/06/19   Camnitz, Andree CossWill Martin, MD  Cholecalciferol (VITAMIN D-3) 25 MCG (1000 UT) CAPS Take 1,000 Units by mouth daily with breakfast.    [provider]  enalapril (VASOTEC) 10 MG tablet Take 10 mg by mouth daily.    [provider]  hydrochlorothiazide (MICROZIDE) 12.5 MG capsule Take 12.5 mg by mouth daily.    [provider]  Multiple Vitamins-Minerals (PRESERVISION AREDS 2) CAPS Take by mouth.    [provider]   Social history: Widowed, retired Runner, broadcasting/film/videoteacher.  No alcohol or tobacco use.  Family History  Problem Relation Age of Onset  . Heart disease Father   . CAD Father 3970  . Colon cancer Mother 770    ROS: Currently denies lightheadedness, dizziness, Fever, chills, CP, SOB.   No personal history of DVT, PE, MI, or CVA. No loose teeth or dentures All other systems have been reviewed and were otherwise currently negative with the exception of those mentioned in the HPI and as above.  Objective: Vitals: Ht: 5 feet 5 inches wt: 116 pounds temp: 97.6 BP: 163/84 pulse: 82 O2 98 % on room air.   Physical Exam: General: Alert, NAD. Trendelenberg Gait.  Slow.  Utilizes rolling walker. HEENT: EOMI, Good Neck Extension  Pulm: No increased work of breathing.  Clear B/L A/P w/o crackle or wheeze.  CV: RRR, No m/g/r appreciated  GI: soft, NT,  ND Neuro: Neuro without gross focal deficit.  Sensation intact distally Skin: No lesions in the area of chief complaint MSK/Surgical Site: Right hip pain with passive ROM.  Positive Stinchfield.  4/5 strength.  NVI.  Sensation intact distally.  Imaging Review Plain radiographs demonstrate severe degenerative joint disease of bilateral hips.   Preoperative templating of the joint replacement has been completed, documented, and  submitted to the Operating Room personnel in order to optimize intra-operative equipment management.  Assessment: OA RIGHT HIP Principal Problem:   Primary osteoarthritis of right hip Active Problems:   Hypertension   Complete heart block (HCC)   Cardiomyopathy (HCC)   CRI (chronic renal insufficiency), stage 3 (moderate) (HCC)   Plan: Plan for Procedure(s): TOTAL HIP ARTHROPLASTY ANTERIOR APPROACH  The patient history, physical exam, clinical judgement of the provider and imaging are consistent with end stage degenerative joint disease and total joint arthroplasty is deemed medically necessary. The treatment options including medical management, injection therapy, and arthroplasty were discussed at length. The risks and benefits of Procedure(s): TOTAL HIP ARTHROPLASTY ANTERIOR APPROACH were presented and reviewed.  The risks of nonoperative treatment, versus surgical intervention including but not limited to continued pain, aseptic loosening, stiffness, dislocation/subluxation, infection, bleeding, nerve injury, blood clots, cardiopulmonary complications, morbidity, mortality, among others were discussed. The patient verbalizes understanding and wishes to proceed with the plan.  Patient is being admitted for surgery, pain control, PT, prophylactic antibiotics, VTE prophylaxis, progressive ambulation, ADL's and discharge planning.   Dental prophylaxis discussed and recommended for 2 years postoperatively.   The patient does meet the criteria for TXA which will be used perioperatively.    Eliquis 2.5 mg BID will be resumed postoperatively for DVT prophylaxis in addition to SCDs, and early ambulation.  Cardiology recommends stopping this medicine 3 days prior to surgery.  Plan for Tylenol or Norco for pain.  Baclofen for spasm.  Minimize PO NSAIDs dt renal insufficiency.  The patient is planning to be discharged home with HHPT in care of her son Aaron Edelman.  OPPT after post op office follow  up.  Anticipated LOS less than 2 midnights.  Inpatient insurance approval due to: - Age 75 and older with one or more of the following  - Expected need for hospital services (PT, OT, Nursing) required for safe  discharge  - Active co-morbidities: Cardiac Arrhythmia   Prudencio Burly III, PA-C 05/18/2019 8:11 AM

## 2019-05-29 ENCOUNTER — Other Ambulatory Visit: Payer: Self-pay | Admitting: Orthopedic Surgery

## 2019-05-29 NOTE — Care Plan (Signed)
Met with patient and son in the office for H&P visit. Patient lives with son and he will assist her post op. She has all needed equipment at home. HHPT referral to [facility] at home. She will transition to [place] after MD visit. Patient and MD agreeable to plan. Choice offered.   Ladell Heads, [place]

## 2019-06-01 ENCOUNTER — Other Ambulatory Visit: Payer: Self-pay

## 2019-06-01 ENCOUNTER — Encounter (HOSPITAL_COMMUNITY): Payer: Self-pay

## 2019-06-01 ENCOUNTER — Encounter: Payer: Self-pay | Admitting: Internal Medicine

## 2019-06-01 ENCOUNTER — Ambulatory Visit (INDEPENDENT_AMBULATORY_CARE_PROVIDER_SITE_OTHER): Payer: Medicare Other | Admitting: Internal Medicine

## 2019-06-01 VITALS — BP 130/82 | HR 74 | Temp 93.4°F | Ht 67.0 in | Wt 115.0 lb

## 2019-06-01 DIAGNOSIS — I3139 Other pericardial effusion (noninflammatory): Secondary | ICD-10-CM

## 2019-06-01 DIAGNOSIS — I429 Cardiomyopathy, unspecified: Secondary | ICD-10-CM

## 2019-06-01 DIAGNOSIS — I313 Pericardial effusion (noninflammatory): Secondary | ICD-10-CM | POA: Diagnosis not present

## 2019-06-01 DIAGNOSIS — I442 Atrioventricular block, complete: Secondary | ICD-10-CM | POA: Diagnosis not present

## 2019-06-01 DIAGNOSIS — I10 Essential (primary) hypertension: Secondary | ICD-10-CM | POA: Diagnosis not present

## 2019-06-01 DIAGNOSIS — I4891 Unspecified atrial fibrillation: Secondary | ICD-10-CM

## 2019-06-01 DIAGNOSIS — Z01810 Encounter for preprocedural cardiovascular examination: Secondary | ICD-10-CM

## 2019-06-01 NOTE — Progress Notes (Addendum)
OFFICE NOTE  Chief Complaint:  Follow-up echo  Primary Care Physician: Philip AspenHernandez Acosta, Limmie PatriciaEstela Y, MD  HPI:  Kathryn Strickland is a 85 y.o. female with a past medial history significant for hypertension, dyslipidemia, and significant hip osteoarthritis.  [place] and recover with her [place].  [place].  When seeing her primary care provider she was noted incidentally to be in complete heart block.  She seemed asymptomatic with this but was referred to the hospital.  Ultimately she underwent a pacemaker placement.  A subsequent echocardiogram demonstrated LVEF that was mildly reduced with a trivial pericardial effusion.  Based on these findings we had recommended a repeat limited echo to see if the effusion had resolved.  I discussed the case with Dr. Elberta Fortisamnitz who placed the pacemaker and felt that there was unlikely to be any lead perforation as a cause of her pericardial effusion rather it may be as a result of heart failure related to her complete heart block.  A subsequent echo was performed on May 05, 2019 which showed normalization of EF to 60 to 65%.  She says she feels well and is asymptomatic although does have some memory problems.  Her son notes that she has done fairly well although she has not that active given her hip problems.  She had recently seen Corine ShelterLuke Kilroy, PA-C who felt that she was okay for surgery and she is now scheduled for September 8.  Incidentally, I noted while reviewing the chart today that she has been switched from Xarelto to Eliquis 2.5 mg twice daily.  She was initially placed on Xarelto 20 mg daily it appears on 04/07/2019 and then it was later noted by Elisabeth MostMelissa Macchia, Pharm.D. that the dose was incorrect.  She was then switched to 2.5 mg twice daily.  What I do not understand, is the reason for anticoagulation.  She was not discharged on Xarelto and does not have any history of DVT/PE, A. fib or other  indication that I can tell for anticoagulation.  PMHx:  Past Medical History:  Diagnosis Date   Carotid bruit    LATERAL UNSPECIFIED   Chronic pain    Complete heart block (HCC) 03/2019   Genital disorder, female    Hypercalcemia    Hypercholesterolemia    Hyperlipidemia    Hypertension    Osteoporosis    Postmenopausal    Presence of permanent cardiac pacemaker 03/25/2019   for complete heart block   Vitamin D deficiency     Past Surgical History:  Procedure Laterality Date   COLONOSCOPY     PACEMAKER IMPLANT N/A 03/25/2019   Procedure: PACEMAKER IMPLANT;  Surgeon: Regan Lemmingamnitz, Will Martin, MD;  Location: [facility];  Service: Cardiovascular;  Laterality: N/A;    FAMHx:  Family History  Problem Relation Age of Onset   Heart disease Father    CAD Father [id number]   Colon cancer Mother 6670    SOCHx:   reports that she has never smoked. She has never used smokeless tobacco. She reports that she does not drink alcohol or use drugs.  ALLERGIES:  No Known Allergies  ROS: Pertinent items noted in HPI and remainder of comprehensive ROS otherwise negative.  HOME MEDS: Current Outpatient Medications on File Prior to Visit  Medication Sig Dispense Refill   apixaban (ELIQUIS) 2.5 MG TABS tablet Take 1 tablet (2.5 mg total) by mouth 2 (two) times daily. 60 tablet 5  Cholecalciferol (VITAMIN D-3) 25 MCG (1000 UT) CAPS Take 1,000 Units by mouth daily with breakfast.     enalapril (VASOTEC) 10 MG tablet Take 10 mg by mouth daily.     hydrochlorothiazide (HYDRODIURIL) 12.5 MG tablet Take 12.5 mg by mouth daily.     No current facility-administered medications on file prior to visit.     LABS/IMAGING: No results found for this or any previous visit (from the past 48 hour(s)). No results found.  LIPID PANEL: No results found for: CHOL, TRIG, HDL, CHOLHDL, VLDL, LDLCALC, LDLDIRECT   WEIGHTS: Wt Readings from Last 3 Encounters:  06/01/19 115 lb (52.2 kg)    04/20/19 114 lb 6.4 oz (51.9 kg)  03/25/19 113 lb 4.8 oz (51.4 kg)    VITALS: BP 130/82 (BP Location: Left Arm, Patient Position: Sitting, Cuff Size: Normal)    Pulse 74    Temp (!) 93.4 F (34.1 C)    Ht 5\' 7"  (1.702 m)    Wt 115 lb (52.2 kg)    SpO2 94%    BMI 18.01 kg/m   EXAM: General appearance: alert and no distress Neck: no carotid bruit, no JVD and thyroid not enlarged, symmetric, no tenderness/mass/nodules Lungs: clear to auscultation bilaterally Heart: regular rate and rhythm, S1, S2 normal, no murmur, click, rub or gallop Abdomen: soft, non-tender; bowel sounds normal; no masses,  no organomegaly Extremities: extremities normal, atraumatic, no cyanosis or edema and Pacer site intact without effusion or hematoma Pulses: 2+ and symmetric Skin: Skin color, texture, turgor normal. No rashes or lesions Neurologic: Grossly normal Psych: Pleasant  EKG: Deferred  ASSESSMENT: 1. Acceptable risk for upcoming hip replacement 2. Complete heart block status post pacemaker 3. 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 Pericardial effusion-resolved 5. Paroxysmal atrial fibrillation noted on the pacemaker  PLAN: 1.   Ms. Kathryn Strickland has successfully had pacemaker placement and her LVEF has normalized by echo.  At this point I think she is at acceptable risk for surgery. She was found to have AF on her pacemaker. She will need to hold Eliquis for 3 days prior to hip surgery. She does have a follow-up with Dr. Curt Bears on 05/23/2019.  Since her primary problem is complete heart block and a pacemaker, I think would be appropriate for her to continue to follow with him and she can see me on an as-needed basis.  Pixie Casino, MD, [facility], [place] Director of the [facility] Lipid Disorders &  Cardiovascular Risk Reduction Clinic Diplomate of the American Board of Clinical Lipidology Attending  Cardiologist  Direct Dial: [phone]   Fax: [phone]  Website:  [email or web address] 06/01/2019, 8:08 PM

## 2019-06-01 NOTE — Patient Instructions (Signed)
Medication Instructions:  NO CHANGES If you need a refill on your cardiac medications before your next appointment, please call your pharmacy.   Follow-Up: as routinely scheduled with Dr. Curt Bears  You can HOLD ELIQUIS 3 DAYS prior to procedure

## 2019-06-01 NOTE — Patient Instructions (Addendum)
DUE TO COVID-19 ONLY ONE VISITOR IS ALLOWED TO COME WITH YOU AND STAY IN THE WAITING ROOM ONLY DURING PRE OP AND PROCEDURE DAY OF SURGERY. THE 1 VISITOR MAY VISIT WITH YOU AFTER SURGERY IN YOUR PRIVATE ROOM DURING VISITING HOURS ONLY!  YOU NEED TO HAVE A COVID 19 TEST ON__9/4_____ @__3 :00_____, THIS TEST MUST BE DONE BEFORE SURGERY, COME  [address], [place] , [address].  ([place]) ONCE YOUR COVID TEST IS COMPLETED, PLEASE BEGIN THE QUARANTINE INSTRUCTIONS AS OUTLINED IN YOUR HANDOUT.                Halynn Reitano Getty   Your procedure is scheduled on: Tuesday 06/09/19   Report to [facility] Main  Entrance Report to Short Stay at   5:30 AM     Call this number if you have problems the morning of surgery [place], [place].  Do not eat food After Midnight   YOU MAY HAVE CLEAR LIQUIDS FROM MIDNIGHT UNTIL 4:30AM.  At 4:30AM Please finish the prescribed Pre-Surgery  drink . Nothing by mouth after you finish the  drink !    Take these medicines the morning of surgery with A SIP OF WATER: none                                 You may not have any metal on your body including hair pins and              piercings             Do not wear jewelry, make-up, lotions, powders or perfumes, deodorant             Do not wear nail polish.  Do not shave  48 hours prior to surgery.     Do not bring valuables to the hospital. [place].  Contacts, dentures or bridgework may not be worn into surgery.                 Please read over the following fact sheets you were given: _____________________________________________________________________             [facility] - Preparing for Surgery Before surgery, you can play an important role.   Because skin is not sterile, your skin needs to be as free of germs as  possible.   You can reduce the number of germs on your skin by washing with CHG (chlorahexidine gluconate) soap before surgery.   CHG is an antiseptic cleaner which kills germs and bonds with the skin to continue killing germs even after washing. Please DO NOT use if you have an allergy to CHG or antibacterial soaps .  If your skin becomes reddened/irritated stop using the CHG and inform your nurse when you arrive at Short Stay.  Please follow these instructions carefully:  1.  Shower with CHG Soap the night before surgery and the  morning of Surgery.  2.  If you choose to wash your hair, wash your hair first as usual with your  normal  shampoo.  3.  After you shampoo, rinse your hair and body thoroughly to remove the  shampoo.  4.  Use CHG as you would any other liquid soap.  You can apply chg directly  to the skin and wash                       Gently with a scrungie or clean washcloth.  5.  Apply the CHG Soap to your body ONLY FROM THE NECK DOWN.   Do not use on face/ open                           Wound or open sores. Avoid contact with eyes, ears mouth and genitals (private parts).                       Wash face,  Genitals (private parts) with your normal soap.             6.  Wash thoroughly, paying special attention to the area where your surgery  will be performed.  7.  Thoroughly rinse your body with warm water from the neck down.  8.  DO NOT shower/wash with your normal soap after using and rinsing off  the CHG Soap.                9.  Pat yourself dry with a clean towel.            10.  Wear clean pajamas.            11.  Place clean sheets on your bed the night of your first shower and do not  sleep with pets. Day of Surgery : Do not apply any lotions/deodorants the morning of surgery.  Please wear clean clothes to the hospital/surgery center.  FAILURE TO FOLLOW THESE INSTRUCTIONS MAY RESULT IN THE CANCELLATION OF YOUR SURGERY PATIENT  SIGNATURE_________________________________  NURSE SIGNATURE__________________________________  ________________________________________________________________________   Adam Phenix  An incentive spirometer is a tool that can help keep your lungs clear and active. This tool measures how well you are filling your lungs with each breath. Taking long deep breaths may help reverse or decrease the chance of developing breathing (pulmonary) problems (especially infection) following:  A long period of time when you are unable to move or be active. BEFORE THE PROCEDURE   If the spirometer includes an indicator to show your best effort, your nurse or respiratory therapist will set it to a desired goal.  If possible, sit up straight or lean slightly forward. Try not to slouch.  Hold the incentive spirometer in an upright position. INSTRUCTIONS FOR USE  1. Sit on the edge of your bed if possible, or sit up as far as you can in bed or on a chair. 2. Hold the incentive spirometer in an upright position. 3. Breathe out normally. 4. Place the mouthpiece in your mouth and seal your lips tightly around it. 5. Breathe in slowly and as deeply as possible, raising the piston or the ball toward the top of the column. 6. Hold your breath for 3-5 seconds or for as long as possible. Allow the piston or ball to fall to the bottom of the column. 7. Remove the mouthpiece from your mouth and breathe out normally. 8. Rest for a few seconds and repeat Steps 1 through 7 at least 10 times every 1-2 hours when you are awake. Take your time and take a few normal breaths between deep breaths. 9. The spirometer may include an indicator to show  your best effort. Use the indicator as a goal to work toward during each repetition. 10. After each set of 10 deep breaths, practice coughing to be sure your lungs are clear. If you have an incision (the cut made at the time of surgery), support your incision when coughing  by placing a pillow or rolled up towels firmly against it. Once you are able to get out of bed, walk around indoors and cough well. You may stop using the incentive spirometer when instructed by your caregiver.  RISKS AND COMPLICATIONS  Take your time so you do not get dizzy or light-headed.  If you are in pain, you may need to take or ask for pain medication before doing incentive spirometry. It is harder to take a deep breath if you are having pain. AFTER USE  Rest and breathe slowly and easily.  It can be helpful to keep track of a log of your progress. Your caregiver can provide you with a simple table to help with this. If you are using the spirometer at home, follow these instructions: [place] IF:   You are having difficultly using the spirometer.  You have trouble using the spirometer as often as instructed.  Your pain medication is not giving enough relief while using the spirometer.  You develop fever of 100.5 F (38.1 C) or higher. SEEK IMMEDIATE MEDICAL CARE IF:   You cough up bloody sputum that had not been present before.  You develop fever of 102 F (38.9 C) or greater.  You develop worsening pain at or near the incision site. MAKE SURE YOU:   Understand these instructions.  Will watch your condition.  Will get help right away if you are not doing well or get worse. Document Released: 01/28/2007 Document Revised: 12/10/2011 Document Reviewed: 03/31/2007 [facility] Patient Information 2014 [place], [place].   ________________________________________________________________________

## 2019-06-02 ENCOUNTER — Encounter: Payer: Self-pay | Admitting: Internal Medicine

## 2019-06-02 ENCOUNTER — Encounter (HOSPITAL_COMMUNITY): Payer: Self-pay

## 2019-06-02 ENCOUNTER — Other Ambulatory Visit: Payer: Self-pay

## 2019-06-02 ENCOUNTER — Encounter (HOSPITAL_COMMUNITY)
Admission: RE | Admit: 2019-06-02 | Discharge: 2019-06-02 | Disposition: A | Discharge status: Home / Self Care | Payer: Medicare Other | Source: Ambulatory Visit | Attending: Orthopedic Surgery | Admitting: Orthopedic Surgery

## 2019-06-02 DIAGNOSIS — F0390 Unspecified dementia without behavioral disturbance: Secondary | ICD-10-CM | POA: Diagnosis not present

## 2019-06-02 DIAGNOSIS — Z20828 Contact with and (suspected) exposure to other viral communicable diseases: Secondary | ICD-10-CM | POA: Insufficient documentation

## 2019-06-02 DIAGNOSIS — M1710 Unilateral primary osteoarthritis, unspecified knee: Secondary | ICD-10-CM | POA: Diagnosis not present

## 2019-06-02 DIAGNOSIS — I442 Atrioventricular block, complete: Secondary | ICD-10-CM | POA: Insufficient documentation

## 2019-06-02 DIAGNOSIS — E559 Vitamin D deficiency, unspecified: Secondary | ICD-10-CM | POA: Insufficient documentation

## 2019-06-02 DIAGNOSIS — Z950 Presence of cardiac pacemaker: Secondary | ICD-10-CM | POA: Diagnosis not present

## 2019-06-02 DIAGNOSIS — Z7901 Long term (current) use of anticoagulants: Secondary | ICD-10-CM | POA: Insufficient documentation

## 2019-06-02 DIAGNOSIS — Z79899 Other long term (current) drug therapy: Secondary | ICD-10-CM | POA: Diagnosis not present

## 2019-06-02 DIAGNOSIS — I10 Essential (primary) hypertension: Secondary | ICD-10-CM | POA: Diagnosis not present

## 2019-06-02 DIAGNOSIS — Z01812 Encounter for preprocedural laboratory examination: Secondary | ICD-10-CM | POA: Diagnosis present

## 2019-06-02 DIAGNOSIS — M1611 Unilateral primary osteoarthritis, right hip: Secondary | ICD-10-CM | POA: Insufficient documentation

## 2019-06-02 HISTORY — DX: Unspecified osteoarthritis, unspecified site: M19.90

## 2019-06-02 HISTORY — DX: Unspecified dementia, unspecified severity, without behavioral disturbance, psychotic disturbance, mood disturbance, and anxiety: F03.90

## 2019-06-02 LAB — URINALYSIS, ROUTINE W REFLEX MICROSCOPIC
Bilirubin Urine: NEGATIVE
Glucose, UA: NEGATIVE mg/dL
Hgb urine dipstick: NEGATIVE
Ketones, ur: NEGATIVE mg/dL
Leukocytes,Ua: NEGATIVE
Nitrite: NEGATIVE
Protein, ur: NEGATIVE mg/dL
Specific Gravity, Urine: 1.009 (ref 1.005–1.030)
pH: 7 (ref 5.0–8.0)

## 2019-06-02 LAB — BASIC METABOLIC PANEL
Anion gap: 11 (ref 5–15)
BUN: 24 mg/dL — ABNORMAL HIGH (ref 8–23)
CO2: 26 mmol/L (ref 22–32)
Calcium: 10.5 mg/dL — ABNORMAL HIGH (ref 8.9–10.3)
Chloride: 101 mmol/L (ref 98–111)
Creatinine, Ser: 0.88 mg/dL (ref 0.44–1.00)
GFR calc Af Amer: >60 mL/min (ref >60)
GFR calc non Af Amer: >60 mL/min (ref >60)
Glucose, Bld: 107 mg/dL — ABNORMAL HIGH (ref 70–99)
Potassium: 4.2 mmol/L (ref 3.5–5.1)
Sodium: 138 mmol/L (ref 135–145)

## 2019-06-02 LAB — CBC
HCT: 42.3 % (ref 36.0–46.0)
Hemoglobin: 13 g/dL (ref 12.0–15.0)
MCH: 26.9 pg (ref 26.0–34.0)
MCHC: 30.7 g/dL (ref 30.0–36.0)
MCV: 87.4 fL (ref 80.0–100.0)
Platelets: 216 10*3/uL (ref 150–400)
RBC: 4.84 MIL/uL (ref 3.87–5.11)
RDW: 15.3 % (ref 11.5–15.5)
WBC: 6.7 10*3/uL (ref 4.0–10.5)
nRBC: 0 % (ref 0.0–0.2)

## 2019-06-02 LAB — SURGICAL PCR SCREEN
MRSA, PCR: NEGATIVE
Staphylococcus aureus: POSITIVE — AB

## 2019-06-02 NOTE — Addendum Note (Signed)
Addended by: Fidel Levy on: 06/02/2019 09:21 AM   Modules accepted: Orders

## 2019-06-02 NOTE — Progress Notes (Signed)
PCP - Dr. Judieth Keens Cardiologist - Dr. Debara Pickett  Chest x-ray - 04/20/19 EKG - 03/26/19 Stress Test -  ECHO - 05/05/19 Cardiac Cath - no  Sleep Study - no CPAP -   Fasting Blood Sugar - NA Checks Blood Sugar _____ times a day  Blood Thinner Instructions:Eliquis managed by Dr. Debara Pickett Aspirin Instructions:was told to stop it Sat. 06/06/19 By MD. Reported by [facility] [name] Son Kathryn Strickland. Last Dose:  Anesthesia review:   Patient denies shortness of breath, fever, cough and chest pain at PAT appointment yes  Patient verbalized understanding of instructions that were given to them at the PAT appointment. Patient was also instructed that they will need to review over the PAT instructions again at home before surgery. Yes PAC placed for complete heart block by Dr. Curt Bears 03/25/19

## 2019-06-05 ENCOUNTER — Other Ambulatory Visit (HOSPITAL_COMMUNITY)
Admission: RE | Admit: 2019-06-05 | Discharge: 2019-06-05 | Disposition: A | Discharge status: Home / Self Care | Payer: Medicare Other | Source: Ambulatory Visit | Attending: Orthopedic Surgery | Admitting: Orthopedic Surgery

## 2019-06-05 DIAGNOSIS — Z01812 Encounter for preprocedural laboratory examination: Secondary | ICD-10-CM | POA: Diagnosis not present

## 2019-06-05 NOTE — Anesthesia Preprocedure Evaluation (Addendum)
Anesthesia Evaluation  Patient identified by MRN, date of birth, ID band Patient awake    Reviewed: Allergy & Precautions, NPO status , Patient's Chart, lab work & pertinent test results  Airway Mallampati: II  TM Distance: >3 FB Neck ROM: Full    Dental no notable dental hx. (+) Teeth Intact   Pulmonary neg pulmonary ROS,    Pulmonary exam normal breath sounds clear to auscultation       Cardiovascular hypertension, Pt. on medications Normal cardiovascular exam+ dysrhythmias Atrial Fibrillation + pacemaker  Rhythm:Regular Rate:Normal  05/05/19 Echo The left ventricle has normal systolic function with an ejection fraction of 60-65%. The cavity size was normal. Left ventricular diastolic Doppler parameters are consistent with pseudonormalization.  Pacemaker for 3rd degree heart block   Neuro/Psych Dementia negative neurological ROS     GI/Hepatic negative GI ROS, Neg liver ROS,   Endo/Other  negative endocrine ROS  Renal/GU   negative genitourinary   Musculoskeletal   Abdominal   Peds negative pediatric ROS (+)  Hematology negative hematology ROS (+)   Anesthesia Other Findings   Reproductive/Obstetrics                          Lab Results  Component Value Date   WBC 6.7 06/02/2019   HGB 13.0 06/02/2019   HCT 42.3 06/02/2019   MCV 87.4 06/02/2019   PLT 216 06/02/2019   Lab Results  Component Value Date   CREATININE 0.88 06/02/2019   BUN 24 (H) 06/02/2019   NA 138 06/02/2019   K 4.2 06/02/2019   CL 101 06/02/2019   CO2 26 06/02/2019     Anesthesia Physical Anesthesia Plan  ASA: III  Anesthesia Plan: Spinal   Post-op Pain Management:    Induction:   PONV Risk Score and Plan: Treatment may vary due to age or medical condition and Ondansetron  Airway Management Planned: Nasal Cannula and Natural Airway  Additional Equipment: None  Intra-op Plan:   Post-operative Plan:    Informed Consent: I have reviewed the patients History and Physical, chart, labs and discussed the procedure including the risks, benefits and alternatives for the proposed anesthesia with the patient or authorized representative who has indicated his/her understanding and acceptance.     Dental advisory given  Plan Discussed with:   Anesthesia Plan Comments: (See PAT note 06/02/2019, Konrad Felix, PA-C Check last eliquis dose last 9/5  R Hip under spinal)     Anesthesia Quick Evaluation

## 2019-06-05 NOTE — Progress Notes (Signed)
Anesthesia Chart Review   Case: [id number] Date/Time: 06/09/19 0715   Procedure: TOTAL HIP ARTHROPLASTY ANTERIOR APPROACH (Right )   Anesthesia type: Choice   Pre-op diagnosis: OA RIGHT HIP   Location: [facility] ROOM 08 / [facility]   Surgeon: Renette Butters, MD      DISCUSSION:85 y.o. never smoker with h/o HTN, HLD, cardiomyopathy EF 60-65%, CHB with pacemaker, PAF (on Eliquis), dementia, right hip OA scheduled for above procedure 06/09/2019 with Dr. Edmonia Lynch.   On Eliquis, last dose 06/06/2019.    Last seen by cardiologist, Dr. Lyman Bishop, 06/01/2019.  Per OV note, "Acceptable risk for upcoming hip replacement. At this point I think she is at acceptable risk for surgery. She was found to have AF on her pacemaker."  Anticipate pt can proceed with planned procedure barring acute status change.   VS: BP (!) 136/55   Pulse 81   Temp 36.6 C (Oral)   Resp 18   Ht 5\' 7"  (1.702 m)   Wt 50.5 kg   SpO2 99%   BMI 17.42 kg/m   PROVIDERS: Isaac Bliss, Rayford Halsted, MD is PCP   Lyman Bishop, MD is Cardiologist  LABS: Labs reviewed: Acceptable for surgery. (all labs ordered are listed, but only abnormal results are displayed)  Labs Reviewed  SURGICAL PCR SCREEN - Abnormal; Notable for the following components:      Result Value   Staphylococcus aureus POSITIVE (*)    All other components within normal limits  BASIC METABOLIC PANEL - Abnormal; Notable for the following components:   Glucose, Bld 107 (*)    BUN 24 (*)    Calcium 10.5 (*)    All other components within normal limits  URINALYSIS, ROUTINE W REFLEX MICROSCOPIC - Abnormal; Notable for the following components:   Color, Urine STRAW (*)    All other components within normal limits  CBC     IMAGES: Chest Xray 04/20/2019 FINDINGS: Left-sided pacemaker unchanged. Lungs are hyperexpanded without focal airspace consolidation or effusion. Flattening of the hemidiaphragms on the lateral film. Cardiomediastinal silhouette  and remainder of the exam is unchanged.  IMPRESSION: No active cardiopulmonary disease.  COPD.   EKG: 03/26/2019 Rate 75 bpm Atrial-sensed ventricular paced rhythm No significant change since last tracing   CV: Echo 05/05/2019 IMPRESSIONS    1. The left ventricle has normal systolic function with an ejection fraction of 60-65%. The cavity size was normal. Left ventricular diastolic Doppler parameters are consistent with pseudonormalization.  2. The right ventricle has normal systolic function. The cavity was normal. There is no increase in right ventricular wall thickness.  3. Tricuspid valve regurgitation is moderate.  4. The aortic valve is abnormal. Mild thickening of the aortic valve. No stenosis of the aortic valve.  5. The aorta is normal in size and structure.  6. The aortic root and ascending aorta are normal in size and structure.  7. Grossly normal. Past Medical History:  Diagnosis Date  . Arthritis    Hips,Knees   . Carotid bruit    LATERAL UNSPECIFIED  . Chronic pain   . Complete heart block ([place]) 03/2019  . Dementia ([place])    Short term memory  . Genital disorder, female   . Hypercalcemia   . Hypercholesterolemia   . Hyperlipidemia   . Hypertension   . Osteoporosis   . Postmenopausal   . Presence of permanent cardiac pacemaker 03/25/2019   for complete heart block  . Vitamin D deficiency     Past  Surgical History:  Procedure Laterality Date  . COLONOSCOPY    . PACEMAKER IMPLANT N/A 03/25/2019   Procedure: PACEMAKER IMPLANT;  Surgeon: Regan Lemmingamnitz, Will Martin, MD;  Location: [facility]VAS[facility];  Service: Cardiovascular;  Laterality: N/A;    MEDICATIONS: . apixaban (ELIQUIS) 2.5 MG TABS tablet  . Cholecalciferol (VITAMIN D-3) 25 MCG (1000 UT) CAPS  . enalapril (VASOTEC) 10 MG tablet  . hydrochlorothiazide (HYDRODIURIL) 12.5 MG tablet   No current facility-administered medications for this encounter.     Janey GentaJessica Cecylia Brazill, PA-C [facility] Pre-Surgical  Testing [phone] 06/05/19 10:09 AM

## 2019-06-06 LAB — NOVEL CORONAVIRUS, NAA (HOSP ORDER, SEND-OUT TO REF LAB; TAT 18-24 HRS): SARS-CoV-2, NAA: NOT DETECTED

## 2019-06-08 MED ORDER — BUPIVACAINE LIPOSOME 1.3 % IJ SUSP
10.0000 mL | INTRAMUSCULAR | Status: DC
  Filled 2019-06-08: qty 10

## 2019-06-09 ENCOUNTER — Inpatient Hospital Stay (HOSPITAL_COMMUNITY): Payer: Medicare Other | Admitting: Physician Assistant

## 2019-06-09 ENCOUNTER — Inpatient Hospital Stay (HOSPITAL_COMMUNITY): Payer: Medicare Other | Admitting: Anesthesiology

## 2019-06-09 ENCOUNTER — Inpatient Hospital Stay (HOSPITAL_COMMUNITY): Payer: Medicare Other

## 2019-06-09 ENCOUNTER — Encounter (HOSPITAL_COMMUNITY)
Admission: RE | Disposition: A | Discharge status: Home / Self Care | Payer: Self-pay | Source: Home / Self Care | Attending: Orthopedic Surgery

## 2019-06-09 ENCOUNTER — Other Ambulatory Visit: Payer: Self-pay

## 2019-06-09 ENCOUNTER — Observation Stay (HOSPITAL_COMMUNITY)
Admission: RE | Admit: 2019-06-09 | Discharge: 2019-06-10 | Disposition: A | Discharge status: Home / Self Care | Payer: Medicare Other | Attending: Orthopedic Surgery | Admitting: Orthopedic Surgery

## 2019-06-09 ENCOUNTER — Encounter (HOSPITAL_COMMUNITY): Payer: Self-pay

## 2019-06-09 DIAGNOSIS — E559 Vitamin D deficiency, unspecified: Secondary | ICD-10-CM | POA: Diagnosis not present

## 2019-06-09 DIAGNOSIS — I429 Cardiomyopathy, unspecified: Secondary | ICD-10-CM

## 2019-06-09 DIAGNOSIS — I129 Hypertensive chronic kidney disease with stage 1 through stage 4 chronic kidney disease, or unspecified chronic kidney disease: Secondary | ICD-10-CM | POA: Insufficient documentation

## 2019-06-09 DIAGNOSIS — N183 Chronic kidney disease, stage 3 unspecified: Secondary | ICD-10-CM

## 2019-06-09 DIAGNOSIS — I10 Essential (primary) hypertension: Secondary | ICD-10-CM | POA: Diagnosis present

## 2019-06-09 DIAGNOSIS — Z7901 Long term (current) use of anticoagulants: Secondary | ICD-10-CM | POA: Diagnosis not present

## 2019-06-09 DIAGNOSIS — I442 Atrioventricular block, complete: Secondary | ICD-10-CM | POA: Diagnosis present

## 2019-06-09 DIAGNOSIS — I4891 Unspecified atrial fibrillation: Secondary | ICD-10-CM

## 2019-06-09 DIAGNOSIS — F0390 Unspecified dementia without behavioral disturbance: Secondary | ICD-10-CM | POA: Diagnosis not present

## 2019-06-09 DIAGNOSIS — Z950 Presence of cardiac pacemaker: Secondary | ICD-10-CM | POA: Diagnosis not present

## 2019-06-09 DIAGNOSIS — M1611 Unilateral primary osteoarthritis, right hip: Secondary | ICD-10-CM | POA: Diagnosis not present

## 2019-06-09 DIAGNOSIS — Z79899 Other long term (current) drug therapy: Secondary | ICD-10-CM | POA: Diagnosis not present

## 2019-06-09 DIAGNOSIS — N2889 Other specified disorders of kidney and ureter: Secondary | ICD-10-CM | POA: Diagnosis present

## 2019-06-09 DIAGNOSIS — M1610 Unilateral primary osteoarthritis, unspecified hip: Secondary | ICD-10-CM | POA: Diagnosis present

## 2019-06-09 DIAGNOSIS — Z419 Encounter for procedure for purposes other than remedying health state, unspecified: Secondary | ICD-10-CM

## 2019-06-09 HISTORY — PX: TOTAL HIP ARTHROPLASTY: SHX124

## 2019-06-09 SURGERY — ARTHROPLASTY, HIP, TOTAL, ANTERIOR APPROACH
Anesthesia: Spinal | Site: Hip | Laterality: Right

## 2019-06-09 MED ORDER — ONDANSETRON HCL 4 MG PO TABS: 4.0000 mg | ORAL_TABLET | Freq: Four times a day (QID) | ORAL | Status: DC | PRN

## 2019-06-09 MED ORDER — HYDROCHLOROTHIAZIDE 12.5 MG PO CAPS
12.5000 mg | ORAL_CAPSULE | Freq: Every day | ORAL | Status: DC
  Administered 2019-06-10: 09:00:00 12.5 mg via ORAL
  Filled 2019-06-09: qty 1

## 2019-06-09 MED ORDER — PROPOFOL 10 MG/ML IV BOLUS
INTRAVENOUS | Status: DC | PRN
  Administered 2019-06-09: 20 mg via INTRAVENOUS

## 2019-06-09 MED ORDER — BUPIVACAINE IN DEXTROSE 0.75-8.25 % IT SOLN
INTRATHECAL | Status: DC | PRN
  Administered 2019-06-09: 1.6 mL via INTRATHECAL

## 2019-06-09 MED ORDER — METOCLOPRAMIDE HCL 5 MG PO TABS: 5.0000 mg | ORAL_TABLET | Freq: Three times a day (TID) | ORAL | Status: DC | PRN

## 2019-06-09 MED ORDER — SORBITOL 70 % SOLN
30.0000 mL | Freq: Every day | Status: DC | PRN
  Filled 2019-06-09: qty 30

## 2019-06-09 MED ORDER — ACETAMINOPHEN 325 MG PO TABS: 325.0000 mg | ORAL_TABLET | Freq: Four times a day (QID) | ORAL | Status: DC | PRN

## 2019-06-09 MED ORDER — SODIUM CHLORIDE FLUSH 0.9 % IV SOLN
INTRAVENOUS | Status: DC | PRN
  Administered 2019-06-09: 20 mL

## 2019-06-09 MED ORDER — BUPIVACAINE LIPOSOME 1.3 % IJ SUSP
INTRAMUSCULAR | Status: DC | PRN
  Administered 2019-06-09: 10 mL

## 2019-06-09 MED ORDER — POLYETHYLENE GLYCOL 3350 17 G PO PACK: 17.0000 g | PACK | Freq: Every day | ORAL | Status: DC | PRN

## 2019-06-09 MED ORDER — LIDOCAINE 2% (20 MG/ML) 5 ML SYRINGE
INTRAMUSCULAR | Status: AC
  Filled 2019-06-09: qty 5

## 2019-06-09 MED ORDER — ONDANSETRON HCL 4 MG/2ML IJ SOLN
INTRAMUSCULAR | Status: AC
  Filled 2019-06-09: qty 2

## 2019-06-09 MED ORDER — WATER FOR IRRIGATION, STERILE IR SOLN
Status: DC | PRN
  Administered 2019-06-09: 2000 mL

## 2019-06-09 MED ORDER — BACLOFEN 10 MG PO TABS: 10.0000 mg | ORAL_TABLET | Freq: Three times a day (TID) | ORAL | Status: DC | PRN

## 2019-06-09 MED ORDER — HYDROCODONE-ACETAMINOPHEN 5-325 MG PO TABS: 1.0000 | ORAL_TABLET | Freq: Four times a day (QID) | ORAL | 0 refills | Status: DC | PRN

## 2019-06-09 MED ORDER — SODIUM CHLORIDE 0.9 % IV SOLN
INTRAVENOUS | Status: DC | PRN
  Administered 2019-06-09: 40 ug/min via INTRAVENOUS

## 2019-06-09 MED ORDER — FENTANYL CITRATE (PF) 100 MCG/2ML IJ SOLN
INTRAMUSCULAR | Status: AC
  Filled 2019-06-09: qty 2

## 2019-06-09 MED ORDER — DIPHENHYDRAMINE HCL 12.5 MG/5ML PO ELIX: 12.5000 mg | ORAL_SOLUTION | ORAL | Status: DC | PRN

## 2019-06-09 MED ORDER — CEFAZOLIN SODIUM-DEXTROSE 1-4 GM/50ML-% IV SOLN
1.0000 g | Freq: Four times a day (QID) | INTRAVENOUS | Status: AC
  Administered 2019-06-09 (×2): 1 g via INTRAVENOUS
  Filled 2019-06-09 (×2): qty 50

## 2019-06-09 MED ORDER — ONDANSETRON HCL 4 MG/2ML IJ SOLN: 4.0000 mg | Freq: Once | INTRAMUSCULAR | Status: DC | PRN

## 2019-06-09 MED ORDER — PROPOFOL 500 MG/50ML IV EMUL
INTRAVENOUS | Status: DC | PRN
  Administered 2019-06-09: 75 ug/kg/min via INTRAVENOUS

## 2019-06-09 MED ORDER — HYDROMORPHONE HCL 1 MG/ML IJ SOLN: 0.2500 mg | INTRAMUSCULAR | Status: DC | PRN

## 2019-06-09 MED ORDER — DOCUSATE SODIUM 100 MG PO CAPS
100.0000 mg | ORAL_CAPSULE | Freq: Two times a day (BID) | ORAL | Status: DC
  Administered 2019-06-09 – 2019-06-10 (×2): 100 mg via ORAL
  Filled 2019-06-09 (×2): qty 1

## 2019-06-09 MED ORDER — CHLORHEXIDINE GLUCONATE 4 % EX LIQD: 60.0000 mL | Freq: Once | CUTANEOUS | Status: DC

## 2019-06-09 MED ORDER — POVIDONE-IODINE 10 % EX SWAB
2.0000 "application " | Freq: Once | CUTANEOUS | Status: AC
  Administered 2019-06-09: 2 via TOPICAL

## 2019-06-09 MED ORDER — FENTANYL CITRATE (PF) 100 MCG/2ML IJ SOLN
INTRAMUSCULAR | Status: DC | PRN
  Administered 2019-06-09 (×2): 50 ug via INTRATHECAL

## 2019-06-09 MED ORDER — LACTATED RINGERS IV SOLN
INTRAVENOUS | Status: DC
  Administered 2019-06-09 (×2): via INTRAVENOUS

## 2019-06-09 MED ORDER — DEXAMETHASONE SODIUM PHOSPHATE 10 MG/ML IJ SOLN
10.0000 mg | Freq: Once | INTRAMUSCULAR | Status: DC
  Filled 2019-06-09: qty 1

## 2019-06-09 MED ORDER — ONDANSETRON HCL 4 MG/2ML IJ SOLN: 4.0000 mg | Freq: Four times a day (QID) | INTRAMUSCULAR | Status: DC | PRN

## 2019-06-09 MED ORDER — PHENYLEPHRINE HCL (PRESSORS) 10 MG/ML IV SOLN
INTRAVENOUS | Status: AC
  Filled 2019-06-09: qty 1

## 2019-06-09 MED ORDER — HYDROCODONE-ACETAMINOPHEN 5-325 MG PO TABS
1.0000 | ORAL_TABLET | Freq: Four times a day (QID) | ORAL | Status: DC | PRN
  Administered 2019-06-10: 1 via ORAL
  Filled 2019-06-09: qty 1

## 2019-06-09 MED ORDER — ONDANSETRON HCL 4 MG/2ML IJ SOLN
INTRAMUSCULAR | Status: DC | PRN
  Administered 2019-06-09: 4 mg via INTRAVENOUS

## 2019-06-09 MED ORDER — CEFAZOLIN SODIUM-DEXTROSE 2-4 GM/100ML-% IV SOLN
2.0000 g | INTRAVENOUS | Status: AC
  Administered 2019-06-09: 08:00:00 2 g via INTRAVENOUS
  Filled 2019-06-09: qty 100

## 2019-06-09 MED ORDER — MAGNESIUM CITRATE PO SOLN: 1.0000 | Freq: Once | ORAL | Status: DC | PRN

## 2019-06-09 MED ORDER — LACTATED RINGERS IV SOLN
INTRAVENOUS | Status: DC
  Administered 2019-06-09 (×2): via INTRAVENOUS

## 2019-06-09 MED ORDER — SODIUM CHLORIDE (PF) 0.9 % IJ SOLN
INTRAMUSCULAR | Status: AC
  Filled 2019-06-09: qty 20

## 2019-06-09 MED ORDER — ACETAMINOPHEN 500 MG PO TABS
500.0000 mg | ORAL_TABLET | Freq: Four times a day (QID) | ORAL | Status: AC
  Filled 2019-06-09 (×2): qty 1

## 2019-06-09 MED ORDER — ACETAMINOPHEN 500 MG PO TABS
1000.0000 mg | ORAL_TABLET | Freq: Once | ORAL | Status: AC
  Administered 2019-06-09: 06:00:00 1000 mg via ORAL
  Filled 2019-06-09: qty 2

## 2019-06-09 MED ORDER — BACLOFEN 10 MG PO TABS: 10.0000 mg | ORAL_TABLET | Freq: Two times a day (BID) | ORAL | 0 refills | Status: DC | PRN

## 2019-06-09 MED ORDER — ACETAMINOPHEN 10 MG/ML IV SOLN: 1000.0000 mg | Freq: Once | INTRAVENOUS | Status: DC | PRN

## 2019-06-09 MED ORDER — 0.9 % SODIUM CHLORIDE (POUR BTL) OPTIME
TOPICAL | Status: DC | PRN
  Administered 2019-06-09: 1000 mL

## 2019-06-09 MED ORDER — PHENOL 1.4 % MT LIQD
1.0000 | OROMUCOSAL | Status: DC | PRN
  Filled 2019-06-09: qty 177

## 2019-06-09 MED ORDER — FENTANYL CITRATE (PF) 100 MCG/2ML IJ SOLN: 25.0000 ug | INTRAMUSCULAR | Status: DC | PRN

## 2019-06-09 MED ORDER — TRANEXAMIC ACID-NACL 1000-0.7 MG/100ML-% IV SOLN
1000.0000 mg | INTRAVENOUS | Status: AC
  Administered 2019-06-09: 1000 mg via INTRAVENOUS
  Filled 2019-06-09: qty 100

## 2019-06-09 MED ORDER — ONDANSETRON HCL 4 MG PO TABS: 4.0000 mg | ORAL_TABLET | Freq: Three times a day (TID) | ORAL | 0 refills | Status: DC | PRN

## 2019-06-09 MED ORDER — PHENYLEPHRINE 40 MCG/ML (10ML) SYRINGE FOR IV PUSH (FOR BLOOD PRESSURE SUPPORT)
PREFILLED_SYRINGE | INTRAVENOUS | Status: DC | PRN
  Administered 2019-06-09 (×4): 80 ug via INTRAVENOUS

## 2019-06-09 MED ORDER — POVIDONE-IODINE 10 % EX SWAB: 2.0000 "application " | Freq: Once | CUTANEOUS | Status: DC

## 2019-06-09 MED ORDER — LIDOCAINE 2% (20 MG/ML) 5 ML SYRINGE
INTRAMUSCULAR | Status: DC | PRN
  Administered 2019-06-09: 40 mg via INTRAVENOUS

## 2019-06-09 MED ORDER — METOCLOPRAMIDE HCL 5 MG/ML IJ SOLN: 5.0000 mg | Freq: Three times a day (TID) | INTRAMUSCULAR | Status: DC | PRN

## 2019-06-09 MED ORDER — MENTHOL 3 MG MT LOZG: 1.0000 | LOZENGE | OROMUCOSAL | Status: DC | PRN

## 2019-06-09 MED ORDER — PROPOFOL 10 MG/ML IV BOLUS
INTRAVENOUS | Status: AC
  Filled 2019-06-09: qty 80

## 2019-06-09 MED ORDER — APIXABAN 2.5 MG PO TABS
2.5000 mg | ORAL_TABLET | Freq: Two times a day (BID) | ORAL | Status: DC
  Administered 2019-06-10: 2.5 mg via ORAL
  Filled 2019-06-09: qty 1

## 2019-06-09 SURGICAL SUPPLY — 39 items
BLADE SAG 18X100X1.27 (BLADE) ×1 IMPLANT
BLADE SURG SZ10 CARB STEEL (BLADE) ×4 IMPLANT
CHLORAPREP W/TINT 26 (MISCELLANEOUS) ×2 IMPLANT
CLSR STERI-STRIP ANTIMIC 1/2X4 (GAUZE/BANDAGES/DRESSINGS) ×2 IMPLANT
COVER PERINEAL POST (MISCELLANEOUS) ×2 IMPLANT
COVER SURGICAL LIGHT HANDLE (MISCELLANEOUS) ×2 IMPLANT
COVER WAND RF STERILE (DRAPES) IMPLANT
DECANTER SPIKE VIAL GLASS SM (MISCELLANEOUS) ×4 IMPLANT
DRAPE IMP U-DRAPE 54X76 (DRAPES) ×2 IMPLANT
DRAPE STERI IOBAN 125X83 (DRAPES) ×2 IMPLANT
DRAPE U-SHAPE 47X51 STRL (DRAPES) ×4 IMPLANT
DRSG MEPILEX BORDER 4X8 (GAUZE/BANDAGES/DRESSINGS) ×2 IMPLANT
ELECT BLADE TIP CTD 4 INCH (ELECTRODE) ×2 IMPLANT
GLOVE BIO SURGEON STRL SZ7.5 (GLOVE) ×4 IMPLANT
GLOVE BIOGEL PI IND STRL 8 (GLOVE) ×2 IMPLANT
GLOVE BIOGEL PI INDICATOR 8 (GLOVE) ×2
GOWN STRL REUS W/TWL LRG LVL3 (GOWN DISPOSABLE) ×2 IMPLANT
GOWN STRL REUS W/TWL XL LVL3 (GOWN DISPOSABLE) ×2 IMPLANT
HEAD BIOLOX HIP 36/-5 (Joint) IMPLANT
HIP BIOLOX HD 36/-5 (Joint) ×2 IMPLANT
INSERT TRIDENT POLY 36MM 0DEG (Insert) ×1 IMPLANT
KIT TURNOVER KIT A (KITS) IMPLANT
MANIFOLD NEPTUNE II (INSTRUMENTS) ×2 IMPLANT
NS IRRIG 1000ML POUR BTL (IV SOLUTION) ×2 IMPLANT
PACK ANTERIOR HIP CUSTOM (KITS) ×2 IMPLANT
PROTECTOR NERVE ULNAR (MISCELLANEOUS) ×2 IMPLANT
SCREW HEX LP 6.5X20 (Screw) ×1 IMPLANT
SHELL CLUSTERHOLE ACETABULAR 5 (Shell) ×1 IMPLANT
STEM ACCOLADE II SZ8 (Stem) ×1 IMPLANT
SUT MNCRL AB 4-0 PS2 18 (SUTURE) ×2 IMPLANT
SUT STRATAFIX 0 PDS 27 VIOLET (SUTURE) ×2
SUT VIC AB 0 CT1 36 (SUTURE) ×2 IMPLANT
SUT VIC AB 1 CT1 36 (SUTURE) ×2 IMPLANT
SUT VIC AB 2-0 CT1 27 (SUTURE) ×2
SUT VIC AB 2-0 CT1 TAPERPNT 27 (SUTURE) ×2 IMPLANT
SUTURE STRATFX 0 PDS 27 VIOLET (SUTURE) ×1 IMPLANT
TRAY FOLEY MTR SLVR 16FR STAT (SET/KITS/TRAYS/PACK) IMPLANT
WATER STERILE IRR 1000ML POUR (IV SOLUTION) ×4 IMPLANT
YANKAUER SUCT BULB TIP 10FT TU (MISCELLANEOUS) ×2 IMPLANT

## 2019-06-09 NOTE — Evaluation (Signed)
Physical Therapy Evaluation Patient Details Name: Kathryn Strickland MRN: [id number] DOB: September 19, 1933 Today's Date: 06/09/2019   History of Present Illness  R DA-THA; PMH of complete heart block, s/p pacemaker implantation  Clinical Impression  Pt is s/p THA resulting in the deficits listed below (see PT Problem List). Pt ambulated 7775' with RW, initiated THA HEP. Good progress expected.  Pt will benefit from skilled PT to increase their independence and safety with mobility to allow discharge to the venue listed below.      Follow Up Recommendations Follow surgeon's recommendation for DC plan and follow-up therapies    Equipment Recommendations  None recommended by PT    Recommendations for Other Services       Precautions / Restrictions Precautions Precautions: Fall Precaution Comments: 1 fall in past 1 year (occurred in May 2020, son thinks it was due to cardiac issues, occurred prior to her pacemaker implantation) Restrictions Weight Bearing Restrictions: No Other Position/Activity Restrictions: WBAT      Mobility  Bed Mobility Overal bed mobility: Needs Assistance Bed Mobility: Supine to Sit     Supine to sit: Mod assist     General bed mobility comments: assist to raise trunk and pivot hips with pad  Transfers Overall transfer level: Needs assistance Equipment used: Rolling walker (2 wheeled) Transfers: Sit to/from Stand Sit to Stand: Min assist;From elevated surface         General transfer comment: VCs hand placement, min A to rise  Ambulation/Gait Ambulation/Gait assistance: Min guard Gait Distance (Feet): 75 Feet Assistive device: Rolling walker (2 wheeled) Gait Pattern/deviations: Step-to pattern;Decreased stride length Gait velocity: decr   General Gait Details: VCs sequencing, no loss of balance  Stairs            Wheelchair Mobility    Modified Rankin (Stroke Patients Only)       Balance Overall balance assessment: Modified  Independent                                           Pertinent Vitals/Pain Pain Assessment: 0-10 Pain Score: 0-No pain Pain Location: R hip Pain Descriptors / Indicators: Sore Pain Intervention(s): Limited activity within patient's tolerance;Monitored during session;Ice applied    Home Living Family/patient expects to be discharged to:: Private residence Living Arrangements: Children     Home Access: Stairs to enter Entrance Stairs-Rails: Right Entrance Stairs-Number of Steps: 3 Home Layout: One level Home Equipment: Environmental consultantWalker - 2 wheels;Toilet riser;Walker - standard      Prior Function Level of Independence: Independent with assistive device(s)         Comments: walked with SW, independent with sponge baths     Hand Dominance        Extremity/Trunk Assessment   Upper Extremity Assessment Upper Extremity Assessment: Overall WFL for tasks assessed    Lower Extremity Assessment Lower Extremity Assessment: RLE deficits/detail RLE Deficits / Details: R hip flexion AAROM 45*, R hip ABDuction ~15*, limited by pain, knee ext +3/5 RLE Sensation: WNL RLE Coordination: WNL    Cervical / Trunk Assessment Cervical / Trunk Assessment: Normal  Communication   Communication: HOH  Cognition Arousal/Alertness: Awake/alert Behavior During Therapy: WFL for tasks assessed/performed Overall Cognitive Status: Within Functional Limits for tasks assessed  General Comments      Exercises Total Joint Exercises Ankle Circles/Pumps: AROM;Both;10 reps;Supine Heel Slides: AAROM;Right;10 reps;Supine Hip ABduction/ADduction: AAROM;Right;10 reps;Supine   Assessment/Plan    PT Assessment Patient needs continued PT services  PT Problem List Decreased strength;Decreased activity tolerance;Decreased range of motion;Decreased balance;Decreased mobility;Pain       PT Treatment Interventions DME instruction;Gait  training;Stair training;Functional mobility training;Therapeutic exercise;Therapeutic activities;Patient/family education    PT Goals (Current goals can be found in the Care Plan section)  Acute Rehab PT Goals Patient Stated Goal: to walk farther PT Goal Formulation: With patient/family Time For Goal Achievement: 06/16/19 Potential to Achieve Goals: Good    Frequency 7X/week   Barriers to discharge        Co-evaluation               AM-PAC PT "6 Clicks" Mobility  Outcome Measure Help needed turning from your back to your side while in a flat bed without using bedrails?: A Little Help needed moving from lying on your back to sitting on the side of a flat bed without using bedrails?: A Lot Help needed moving to and from a bed to a chair (including a wheelchair)?: A Little Help needed standing up from a chair using your arms (e.g., wheelchair or bedside chair)?: A Little Help needed to walk in hospital room?: A Little Help needed climbing 3-5 steps with a railing? : A Lot 6 Click Score: 16    End of Session Equipment Utilized During Treatment: Gait belt Activity Tolerance: Patient tolerated treatment well;No increased pain Patient left: in chair;with call bell/phone within reach;with family/visitor present Nurse Communication: Mobility status PT Visit Diagnosis: Muscle weakness (generalized) (M62.81);Difficulty in walking, not elsewhere classified (R26.2);Pain Pain - Right/Left: Right Pain - part of body: Hip    Time: 1341-1410 PT Time Calculation (min) (ACUTE ONLY): 29 min   Charges:   PT Evaluation $PT Eval Low Complexity: 1 Low PT Treatments $Gait Training: 8-22 mins        Blondell Reveal Kistler PT 06/09/2019  Acute Rehabilitation Services Pager [phone] Office [phone]

## 2019-06-09 NOTE — Interval H&P Note (Signed)
I participated in the care of this patient and agree with the above history, physical and evaluation. I performed a review of the history and a physical exam as detailed   Timothy Daniel Murphy MD  

## 2019-06-09 NOTE — Transfer of Care (Signed)
Immediate Anesthesia Transfer of Care Note  Patient: Kathryn Strickland  Procedure(s) Performed: TOTAL HIP ARTHROPLASTY ANTERIOR APPROACH (Right Hip)  Patient Location: PACU  Anesthesia Type:Spinal  Level of Consciousness: drowsy and patient cooperative  Airway & Oxygen Therapy: Patient Spontanous Breathing and Patient connected to face mask oxygen  Post-op Assessment: Report given to RN and Post -op Vital signs reviewed and stable  Post vital signs: Reviewed and stable  Last Vitals:  Vitals Value Taken Time  BP 115/46 06/09/19 0930  Temp    Pulse 60 06/09/19 0930  Resp 13 06/09/19 0930  SpO2 95 % 06/09/19 0930  Vitals shown include unvalidated device data.  Last Pain:  Vitals:   06/09/19 0625  TempSrc:   PainSc: 0-No pain         Complications: No apparent anesthesia complications

## 2019-06-09 NOTE — Anesthesia Procedure Notes (Signed)
Spinal  Patient location during procedure: OR Start time: 06/09/2019 7:43 AM End time: 06/09/2019 7:46 AM Staffing Anesthesiologist: Barnet Glasgow, MD Resident/CRNA: Raenette Rover, CRNA Performed: resident/CRNA  Preanesthetic Checklist Completed: patient identified, site marked, surgical consent, pre-op evaluation, timeout performed, IV checked, risks and benefits discussed and monitors and equipment checked Spinal Block Patient position: sitting Prep: DuraPrep Patient monitoring: blood pressure, continuous pulse ox and heart rate Approach: midline Location: L3-4 Injection technique: single-shot Needle Needle type: Pencan  Needle gauge: 24 G

## 2019-06-09 NOTE — Discharge Instructions (Signed)
You may bear weight as tolerated. Keep your dressing on and dry until follow up. Take medicine to prevent blood clots as directed - Resume your Eliquis. Take pain medicine as needed with the goal of transitioning to over the counter medicines.    INSTRUCTIONS AFTER JOINT REPLACEMENT   o Remove items at home which could result in a fall. This includes throw rugs or furniture in walking pathways o ICE to the affected joint every three hours while awake for 30 minutes at a time, for at least the first 3-5 days, and then as needed for pain and swelling.  Continue to use ice for pain and swelling. You may notice swelling that will progress down to the foot and ankle.  This is normal after surgery.  Elevate your leg when you are not up walking on it.   o Continue to use the breathing machine you got in the hospital (incentive spirometer) which will help keep your temperature down.  It is common for your temperature to cycle up and down following surgery, especially at night when you are not up moving around and exerting yourself.  The breathing machine keeps your lungs expanded and your temperature down.   DIET:  As you were doing prior to hospitalization, we recommend a well-balanced diet.  DRESSING / WOUND CARE / SHOWERING  You may shower 3 days after surgery, but keep the wounds dry during showering.  You may use an occlusive plastic wrap (Press'n Seal for example) with blue painter's tape at edges, NO SOAKING/SUBMERGING IN THE BATHTUB.  If the bandage gets wet, change with a clean dry gauze.  If the incision gets wet, pat the wound dry with a clean towel.  ACTIVITY  o Increase activity slowly as tolerated, but follow the weight bearing instructions below.   o No driving for 6 weeks or until further direction given by your physician.  You cannot drive while taking narcotics.  o No lifting or carrying greater than 10 lbs. until further directed by your surgeon. o Avoid periods of inactivity such  as sitting longer than an hour when not asleep. This helps prevent blood clots.  o You may return to work once you are authorized by your doctor.     WEIGHT BEARING   Weight bearing as tolerated with assist device (walker, cane, etc) as directed, use it as long as suggested by your surgeon or therapist, typically at least 4-6 weeks.   EXERCISES  Results after joint replacement surgery are often greatly improved when you follow the exercise, range of motion and muscle strengthening exercises prescribed by your doctor. Safety measures are also important to protect the joint from further injury. Any time any of these exercises cause you to have increased pain or swelling, decrease what you are doing until you are comfortable again and then slowly increase them. If you have problems or questions, call your caregiver or physical therapist for advice.   Rehabilitation is important following a joint replacement. After just a few days of immobilization, the muscles of the leg can become weakened and shrink (atrophy).  These exercises are designed to build up the tone and strength of the thigh and leg muscles and to improve motion. Often times heat used for twenty to thirty minutes before working out will loosen up your tissues and help with improving the range of motion but do not use heat for the first two weeks following surgery (sometimes heat can increase post-operative swelling).   These exercises can be  done on a training (exercise) mat, on the floor, on a table or on a bed. Use whatever works the best and is most comfortable for you.    Use music or television while you are exercising so that the exercises are a pleasant break in your day. This will make your life better with the exercises acting as a break in your routine that you can look forward to.   Perform all exercises about fifteen times, three times per day or as directed.  You should exercise both the operative leg and the other leg as  well.  Exercises include:    Quad Sets - Tighten up the muscle on the front of the thigh (Quad) and hold for 5-10 seconds.    Straight Leg Raises - With your knee straight (if you were given a brace, keep it on), lift the leg to 60 degrees, hold for 3 seconds, and slowly lower the leg.  Perform this exercise against resistance later as your leg gets stronger.   Leg Slides: Lying on your back, slowly slide your foot toward your buttocks, bending your knee up off the floor (only go as far as is comfortable). Then slowly slide your foot back down until your leg is flat on the floor again.   Angel Wings: Lying on your back spread your legs to the side as far apart as you can without causing discomfort.   Hamstring Strength:  Lying on your back, push your heel against the floor with your leg straight by tightening up the muscles of your buttocks.  Repeat, but this time bend your knee to a comfortable angle, and push your heel against the floor.  You may put a pillow under the heel to make it more comfortable if necessary.   A rehabilitation program following joint replacement surgery can speed recovery and prevent re-injury in the future due to weakened muscles. Contact your doctor or a physical therapist for more information on knee rehabilitation.    CONSTIPATION  Constipation is defined medically as fewer than three stools per week and severe constipation as less than one stool per week.  Even if you have a regular bowel pattern at home, your normal regimen is likely to be disrupted due to multiple reasons following surgery.  Combination of anesthesia, postoperative narcotics, change in appetite and fluid intake all can affect your bowels.   YOU MUST use at least one of the following options; they are listed in order of increasing strength to get the job done.  They are all available over the counter, and you may need to use some, POSSIBLY even all of these options:    Drink plenty of fluids  (prune juice may be helpful) and high fiber foods Colace 100 mg by mouth twice a day  Senokot for constipation as directed and as needed Dulcolax (bisacodyl), take with full glass of water  Miralax (polyethylene glycol) once or twice a day as needed.  If you have tried all these things and are unable to have a bowel movement in the first 3-4 days after surgery call either your surgeon or your primary doctor.    If you experience loose stools or diarrhea, hold the medications until you stool forms back up.  If your symptoms do not get better within 1 week or if they get worse, check with your doctor.  If you experience "the worst abdominal pain ever" or develop nausea or vomiting, please contact the office immediately for further recommendations for treatment.  ITCHING:  If you experience itching with your medications, try taking only a single pain pill, or even half a pain pill at a time.  You can also use Benadryl over the counter for itching or also to help with sleep.   TED HOSE STOCKINGS:  Use stockings on both legs until for at least 2 weeks or as directed by physician office. They may be removed at night for sleeping.  MEDICATIONS:  See your medication summary on the After Visit Summary that nursing will review with you.  You may have some home medications which will be placed on hold until you complete the course of blood thinner medication.  It is important for you to complete the blood thinner medication as prescribed.  PRECAUTIONS:  If you experience chest pain or shortness of breath - call 911 immediately for transfer to the hospital emergency department.   If you develop a fever greater that 101 F, purulent drainage from wound, increased redness or drainage from wound, foul odor from the wound/dressing, or calf pain - CONTACT YOUR SURGEON.                                                   FOLLOW-UP APPOINTMENTS:  If you do not already have a post-op appointment, please call the  office for an appointment to be seen by your surgeon.  Guidelines for how soon to be seen are listed in your After Visit Summary, but are typically between 1-4 weeks after surgery.  OTHER INSTRUCTIONS:     MAKE SURE YOU:   Understand these instructions.   Get help right away if you are not doing well or get worse.    Thank you for letting us be a part of your medical care team.  It is a privilege we respect greatly.  We hope these instructions will help you stay on track for a fast and full recovery!

## 2019-06-09 NOTE — Care Plan (Signed)
Ortho Bundle Case Management Note  Patient Details  Name: Kathryn Strickland MRN: [id number] Date of Birth: April 01, 1933  Met with patient and son in the office for H&P visit. Patient lives with son and he will assist her post op. She has all needed equipment at home. HHPT referral to [facility]. She will transition to [place] after MD visit. Patient and MD agreeable to plan. Choice offered.                    DME Arranged:    DME Agency:     HH Arranged:  PT HH Agency:  [facility] (formerly [facility] [place])  Additional Comments: Please contact me with any questions of if this plan should need to change.  Ladell Heads,  [place] [facility]ist  [phone] 06/09/2019, 8:24 AM

## 2019-06-09 NOTE — Op Note (Signed)
06/09/2019  9:38 AM  PATIENT:  Kathryn Strickland   MRN: [id number]  PRE-OPERATIVE DIAGNOSIS:  OA RIGHT HIP  POST-OPERATIVE DIAGNOSIS:  OA RIGHT HIP  PROCEDURE:  Procedure(s): TOTAL HIP ARTHROPLASTY ANTERIOR APPROACH  PREOPERATIVE INDICATIONS:    Kathryn Strickland is an 85 y.o. female who has a diagnosis of Primary osteoarthritis of right hip and elected for surgical management after failing conservative treatment.  The risks benefits and alternatives were discussed with the patient including but not limited to the risks of nonoperative treatment, versus surgical intervention including infection, bleeding, nerve injury, periprosthetic fracture, the need for revision surgery, dislocation, leg length discrepancy, blood clots, cardiopulmonary complications, morbidity, mortality, among others, and they were willing to proceed.     OPERATIVE REPORT     SURGEON:    D , MD    ASSISTANT:  Henry Martensen, PA-C, he was present and scrubbed throughout the case, critical for completion in a timely fashion, and for retraction, instrumentation, and closure.     ANESTHESIA:  General    COMPLICATIONS:  None.     COMPONENTS:  Stryker acolade fit femur size 8 with a 36 mm -5 head ball and a PSL acetabular shell size 52 with a  polyethylene liner    PROCEDURE IN DETAIL:   The patient was met in the holding area and  identified.  The appropriate hip was identified and marked at the operative site.  The patient was then transported to the OR  and  placed under anesthesia per that record.  At that point, the patient was  placed in the supine position and  secured to the operating room table and all bony prominences padded. He received pre-operative antibiotics    The operative lower extremity was prepped from the iliac crest to the distal leg.  Sterile draping was performed.  Time out was performed prior to incision.      Skin incision was made just 2 cm lateral to the ASIS   extending in line with the tensor fascia lata. Electrocautery was used to control all bleeders. I dissected down sharply to the fascia of the tensor fascia lata was confirmed that the muscle fibers beneath were running posteriorly. I then incised the fascia over the superficial tensor fascia lata in line with the incision. The fascia was elevated off the anterior aspect of the muscle the muscle was retracted posteriorly and protected throughout the case. I then used electrocautery to incise the tensor fascia lata fascia control and all bleeders. Immediately visible was the fat over top of the anterior neck and capsule.  I removed the anterior fat from the capsule and elevated the rectus muscle off of the anterior capsule. I then removed a large time of capsule. The retractors were then placed over the anterior acetabulum as well as around the superior and inferior neck.  I then made a femoral neck cut. Then used the power corkscrew to remove the femoral head from the acetabulum and thoroughly irrigated the acetabulum. I sized the femoral head.    I then exposed the deep acetabulum, cleared out any tissue including the ligamentum teres.   After adequate visualization, I excised the labrum, and then sequentially reamed.  I then impacted the acetabular implant into place using fluoroscopy for guidance.  Appropriate version and inclination was confirmed clinically matching their bony anatomy, and with fluoroscopy.  I placed a 20 mm screw in the posterior/superio position with an excellent bite.    I then placed the polyethylene   liner in place  I then adducted the leg and released the external rotators from the posterior femur allowing it to be easily delivered up lateral and anterior to the acetabulum for preparation of the femoral canal.    I then prepared the proximal femur using the cookie-cutter and then sequentially reamed and broached.  A trial broach, neck, and head was utilized, and I reduced the  hip and used floroscopy to assess the neck length and femoral implant.  I then impacted the femoral prosthesis into place into the appropriate version. The hip was then reduced and fluoroscopy confirmed appropriate position. Leg lengths were restored.  I then irrigated the hip copiously again with, and repaired the fascia with Vicryl, followed by monocryl for the subcutaneous tissue, Monocryl for the skin, Steri-Strips and sterile gauze. The patient was then awakened and returned to PACU in stable and satisfactory condition. There were no complications.  POST OPERATIVE PLAN: WBAT, DVT px: SCD's/TED, ambulation and chemical dvt px  Edmonia Lynch, MD Orthopedic Surgeon [phone]

## 2019-06-09 NOTE — Anesthesia Postprocedure Evaluation (Signed)
Anesthesia Post Note  Patient: Kathryn Strickland  Procedure(s) Performed: TOTAL HIP ARTHROPLASTY ANTERIOR APPROACH (Right Hip)     Patient location during evaluation: Nursing Unit Anesthesia Type: Spinal Level of consciousness: oriented and awake and alert Pain management: pain level controlled Vital Signs Assessment: post-procedure vital signs reviewed and stable Respiratory status: spontaneous breathing and respiratory function stable Cardiovascular status: blood pressure returned to baseline and stable Postop Assessment: no headache, no backache, no apparent nausea or vomiting and patient able to bend at knees Anesthetic complications: no    Last Vitals:  Vitals:   06/09/19 1125 06/09/19 1225  BP: 140/67 (!) 155/76  Pulse: 60 71  Resp: 16   Temp:  37 C  SpO2: 100% 100%    Last Pain:  Vitals:   06/09/19 1030  TempSrc:   PainSc: 0-No pain                 Barnet Glasgow

## 2019-06-10 ENCOUNTER — Encounter (HOSPITAL_COMMUNITY): Payer: Self-pay | Admitting: Orthopedic Surgery

## 2019-06-10 DIAGNOSIS — M1611 Unilateral primary osteoarthritis, right hip: Secondary | ICD-10-CM | POA: Diagnosis not present

## 2019-06-10 MED ORDER — ENALAPRIL MALEATE 10 MG PO TABS
10.0000 mg | ORAL_TABLET | Freq: Every day | ORAL | Status: DC
  Administered 2019-06-10: 10 mg via ORAL
  Filled 2019-06-10: qty 1

## 2019-06-10 NOTE — Plan of Care (Signed)
  Problem: Education: Goal: Knowledge of the prescribed therapeutic regimen will improve Outcome: Progressing   Problem: Activity: Goal: Ability to tolerate increased activity will improve Outcome: Progressing   Problem: Pain Management: Goal: Pain level will decrease with appropriate interventions Outcome: Progressing   Problem: Clinical Measurements: Goal: Respiratory complications will improve Outcome: Progressing Goal: Cardiovascular complication will be avoided Outcome: Progressing   Problem: Coping: Goal: Level of anxiety will decrease Outcome: Progressing

## 2019-06-10 NOTE — Progress Notes (Signed)
Physical Therapy Treatment Patient Details Name: Kathryn Strickland MRN: [id number] DOB: 1933/08/16 Today's Date: 06/10/2019    History of Present Illness R DA-THA; PMH of complete heart block, s/p pacemaker implantation    PT Comments    Reviewed stair training with pt and her son, Kathryn Strickland. Pt ambulated 100' with RW with supervision and verbal cues for sequencing. From PT standpoint, she is ready to DC home.   Follow Up Recommendations  Follow surgeon's recommendation for DC plan and follow-up therapies     Equipment Recommendations  None recommended by PT    Recommendations for Other Services       Precautions / Restrictions Precautions Precautions: Fall Precaution Comments: 1 fall in past 1 year (occurred in May 2020, son thinks it was due to cardiac issues, occurred prior to her pacemaker implantation) Restrictions Weight Bearing Restrictions: No Other Position/Activity Restrictions: WBAT    Mobility  Bed Mobility Overal bed mobility: Needs Assistance Bed Mobility: Supine to Sit     Supine to sit: Min assist     General bed mobility comments: up in recliner  Transfers Overall transfer level: Needs assistance Equipment used: Rolling walker (2 wheeled) Transfers: Sit to/from Stand Sit to Stand: From elevated surface;Min guard         General transfer comment: VCs hand placement  Ambulation/Gait Ambulation/Gait assistance: Min guard Gait Distance (Feet): 100 Feet Assistive device: Rolling walker (2 wheeled) Gait Pattern/deviations: Step-to pattern;Decreased stride length Gait velocity: decr   General Gait Details: VCs sequencing, no loss of balance   Stairs Stairs: Yes Stairs assistance: Min guard Stair Management: One rail Right;Step to pattern;Forwards;With cane Number of Stairs: 3 General stair comments: VCs sequencing, pt's son present for stair training   Wheelchair Mobility    Modified Rankin (Stroke Patients Only)       Balance  Overall balance assessment: Modified Independent                                          Cognition Arousal/Alertness: Awake/alert Behavior During Therapy: WFL for tasks assessed/performed Overall Cognitive Status: No family/caregiver present to determine baseline cognitive functioning                                 General Comments: pt seems to have some memory issues         General Comments        Pertinent Vitals/Pain Pain Assessment: Faces Faces Pain Scale: Hurts a little bit Pain Location: R hip Pain Descriptors / Indicators: Grimacing Pain Intervention(s): Limited activity within patient's tolerance;Monitored during session;Ice applied;Premedicated before session    Home Living                      Prior Function            PT Goals (current goals can now be found in the care plan section) Acute Rehab PT Goals Patient Stated Goal: to walk farther PT Goal Formulation: With patient/family Time For Goal Achievement: 06/16/19 Potential to Achieve Goals: Good Progress towards PT goals: Progressing toward goals    Frequency    7X/week      PT Plan Current plan remains appropriate    Co-evaluation              AM-PAC PT "6 Clicks" Mobility  Outcome Measure  Help needed turning from your back to your side while in a flat bed without using bedrails?: A Little Help needed moving from lying on your back to sitting on the side of a flat bed without using bedrails?: A Little Help needed moving to and from a bed to a chair (including a wheelchair)?: A Little Help needed standing up from a chair using your arms (e.g., wheelchair or bedside chair)?: A Little Help needed to walk in hospital room?: A Little Help needed climbing 3-5 steps with a railing? : A Little 6 Click Score: 18    End of Session Equipment Utilized During Treatment: Gait belt Activity Tolerance: Patient tolerated treatment well Patient left: in  chair;with call bell/phone within reach;with family/visitor present Nurse Communication: Mobility status PT Visit Diagnosis: Muscle weakness (generalized) (M62.81);Difficulty in walking, not elsewhere classified (R26.2);Pain Pain - Right/Left: Right Pain - part of body: Hip     Time: [id number] PT Time Calculation (min) (ACUTE ONLY): 18 min  Charges:  $Gait Training: 8-22 mins           Ralene BatheUhlenberg, Niam Nepomuceno Kistler PT 06/10/2019  Acute Rehabilitation Services Pager [phone] Office [phone]

## 2019-06-10 NOTE — Care Management Obs Status (Signed)
[place] NOTIFICATION   Patient Details  Name: Kathryn Strickland MRN: [id number] Date of Birth: [birth year]   Medicare Observation Status Notification Given:  Yes    Lia Hopping, [place] 06/10/2019, 10:55 AM

## 2019-06-10 NOTE — Progress Notes (Signed)
    Subjective: Patient reports pain as mild.  Tolerating diet.  Urinating.  No CP, SOB.  Good early mobilization with therapy.  Objective:   VITALS:   Vitals:   06/09/19 1701 06/09/19 2302 06/10/19 0120 06/10/19 0545  BP: (!) 144/66 (!) 151/76 140/68 136/80  Pulse: 87 99 (!) 102 71  Resp: 15 16 16 16   Temp: 97.8 F (36.6 C) 99.3 F (37.4 C) 99.6 F (37.6 C) 99.7 F (37.6 C)  TempSrc: Oral     SpO2: 100% 99% 97% 96%  Weight:      Height:       CBC Latest Ref Rng & Units 06/02/2019 03/24/2019  WBC 4.0 - 10.5 K/uL 6.7 6.9  Hemoglobin 12.0 - 15.0 g/dL 13.0 12.1  Hematocrit 36.0 - 46.0 % 42.3 39.2  Platelets 150 - 400 K/uL 216 226   BMP Latest Ref Rng & Units 06/02/2019 03/25/2019 03/24/2019  Glucose 70 - 99 mg/dL 107(H) 101(H) 107(H)  BUN 8 - 23 mg/dL 24(H) 20 25(H)  Creatinine 0.44 - 1.00 mg/dL 0.88 1.03(H) 1.27(H)  Sodium 135 - 145 mmol/L 138 139 139  Potassium 3.5 - 5.1 mmol/L 4.2 3.9 4.2  Chloride 98 - 111 mmol/L 101 107 106  CO2 22 - 32 mmol/L 26 22 24   Calcium 8.9 - 10.3 mg/dL 10.5(H) 10.0 10.2   Intake/Output      09/08 0701 - 09/09 0700 09/09 0701 - 09/10 0700   P.O. 770    I.V. (mL/kg) 2496.8 (49.4)    Total Intake(mL/kg) 3266.8 (64.7)    Urine (mL/kg/hr) 2800 (2.3)    Emesis/NG output 0    Stool 0    Blood 200    Total Output 3000    Net +266.8         Stool Occurrence 0 x    Emesis Occurrence 0 x       Physical Exam: General: NAD.  Upright in bed.  Calm, conversant.  No increased work of breathing. MSK RLE: Neurovascularly intact Sensation intact distally Feet warm Dorsiflexion/Plantar flexion intact Incision: dressing C/D/I   Assessment: 1 Day Post-Op  S/P Procedure(s) (LRB): TOTAL HIP ARTHROPLASTY ANTERIOR APPROACH (Right) by Dr. Ernesta Amble. Percell Miller on 06/09/2019  Principal Problem:   Primary osteoarthritis of right hip Active Problems:   Hypertension   Complete heart block (HCC)   Cardiomyopathy ([place])   CRI (chronic renal insufficiency),  stage 3 (moderate) (HCC)   Primary localized osteoarthritis of hip   Primary osteoarthritis, status post total hip arthroplasty AFVSN Doing well postop day 1 Tolerating diet and voiding Pain controlled Good early mobilization with therapy  Plan: Up with therapy Incentive Spirometry Apply ice PRN   Weight Bearing: Weight Bearing as Tolerated (WBAT) RLE Dressings: Maintain Mepilex.   VTE prophylaxis: Resume Eliquis 2.5 mg twice daily, SCDs, ambulation  Dispo: Home likely later today pending continued therapy evaluations.  Please communicate/coordinate discharge with her son Aaron Edelman.   Kathryn Elizabeth Martensen III, PA-C 06/10/2019, 7:25 AM

## 2019-06-10 NOTE — Discharge Summary (Signed)
Discharge Summary  Patient ID: Kathryn Strickland MRN: [id number] DOB/AGE: Jun 12, 1933 85 y.o.  Admit date: 06/09/2019 Discharge date: 06/10/2019  Admission Diagnoses:  Primary osteoarthritis of right hip  Discharge Diagnoses:  Principal Problem:   Primary osteoarthritis of right hip Active Problems:   Hypertension   Complete heart block (HCC)   Cardiomyopathy ([place])   CRI (chronic renal insufficiency), stage 3 (moderate) (HCC)   Primary localized osteoarthritis of hip   Past Medical History:  Diagnosis Date  . Arthritis    Hips,Knees   . Carotid bruit    LATERAL UNSPECIFIED  . Chronic pain   . Complete heart block ([place]) 03/2019  . Dementia ([place])    Short term memory  . Genital disorder, female   . Hypercalcemia   . Hypercholesterolemia   . Hyperlipidemia   . Hypertension   . Osteoporosis   . Postmenopausal   . Presence of permanent cardiac pacemaker 03/25/2019   for complete heart block  . Vitamin D deficiency     Surgeries: Procedure(s): TOTAL HIP ARTHROPLASTY ANTERIOR APPROACH on 06/09/2019   Consultants (if any):   Discharged Condition: Improved  Hospital Course: Kathryn Strickland is an 85 y.o. female who was admitted 06/09/2019 with a diagnosis of Primary osteoarthritis of right hip and went to the operating room on 06/09/2019 and underwent the above named procedures.    She was given perioperative antibiotics:  Anti-infectives (From admission, onward)   Start     Dose/Rate Route Frequency Ordered Stop   06/09/19 1400  ceFAZolin (ANCEF) IVPB 1 g/50 mL premix     1 g 100 mL/hr over 30 Minutes Intravenous Every 6 hours 06/09/19 1043 06/09/19 2048   06/09/19 0600  ceFAZolin (ANCEF) IVPB 2g/100 mL premix     2 g 200 mL/hr over 30 Minutes Intravenous On call to O.R. 06/09/19 6269 06/09/19 0748    .  She was given sequential compression devices, early ambulation, and Eliquis 2.5 mg twice daily was resumed (chronic medicine) for DVT prophylaxis.  She  benefited maximally from the hospital stay and there were no complications.    Recent vital signs:  Vitals:   06/10/19 0120 06/10/19 0545  BP: 140/68 136/80  Pulse: (!) 102 71  Resp: 16 16  Temp: 99.6 F (37.6 C) 99.7 F (37.6 C)  SpO2: 97% 96%    Recent laboratory studies:  Lab Results  Component Value Date   HGB 13.0 06/02/2019   HGB 12.1 03/24/2019   Lab Results  Component Value Date   WBC 6.7 06/02/2019   PLT 216 06/02/2019   No results found for: INR Lab Results  Component Value Date   NA 138 06/02/2019   K 4.2 06/02/2019   CL 101 06/02/2019   CO2 26 06/02/2019   BUN 24 (H) 06/02/2019   CREATININE 0.88 06/02/2019   GLUCOSE 107 (H) 06/02/2019    Discharge Medications:   Allergies as of 06/10/2019   No Known Allergies     Medication List    TAKE these medications   baclofen 10 MG tablet Commonly known as: LIORESAL Take 1 tablet (10 mg total) by mouth 2 (two) times daily as needed for muscle spasms.   Eliquis 2.5 MG Tabs tablet Generic drug: apixaban Take 2.5 mg by mouth 2 (two) times daily.   enalapril 10 MG tablet Commonly known as: VASOTEC Take 10 mg by mouth daily.   hydrochlorothiazide 12.5 MG tablet Commonly known as: HYDRODIURIL Take 12.5 mg by mouth daily.   HYDROcodone-acetaminophen  5-325 MG tablet Commonly known as: Norco Take 1-2 tablets by mouth every 6 (six) hours as needed for severe pain (Use Tylenol for Mild and Moderate Pain).   ondansetron 4 MG tablet Commonly known as: Zofran Take 1 tablet (4 mg total) by mouth every 8 (eight) hours as needed for nausea or vomiting.   Vitamin D-3 25 MCG (1000 UT) Caps Take 1,000 Units by mouth daily with breakfast.       Diagnostic Studies: Dg C-arm 1-60 Min-no Report  Result Date: 06/09/2019 Fluoroscopy was utilized by the requesting physician.  No radiographic interpretation.   Dg Hip Operative Unilat W Or W/o Pelvis Left  Result Date: 06/09/2019 CLINICAL DATA:  Intraoperative imaging  for right hip replacement EXAM: OPERATIVE RIGHT HIP (WITH PELVIS IF PERFORMED) 2 VIEWS TECHNIQUE: Fluoroscopic spot image(s) were submitted for interpretation post-operatively. COMPARISON:  None. FINDINGS: Two fluoroscopic spot views the low pelvis and right hip are provided. Right total hip arthroplasty is in place. No acute abnormality is seen. IMPRESSION: Intraoperative imaging for right hip replacement. Electronically Signed   By: Drusilla Kannerhomas  Dalessio M.D.   On: 06/09/2019 09:21    Disposition: Discharge disposition: 01-Home or Self Care       Discharge Instructions    Discharge patient   Complete by: As directed    Okay to Discharge IF: Cleared by PT, pain & nausea controlled PO, ambulating and tolerating diet.  Will d/c in care of her son Arlys JohnBrian - please communicate / coordinate w/ him.   Discharge disposition: 01-Home or Self Care   Discharge patient date: 06/10/2019      Follow-up Information    Sheral ApleyMurphy, Timothy D, MD. Go on 06/24/2019.   Specialty: Orthopedic Surgery Why: Your appointment has been scheduled for  2:45 Contact information: [address] [place] [address] [phone]        [facility] Follow up.   Specialty: Home Health Services Why: You will be seen at home for 5 PT visits prior to starting outpatient physical therapy  Contact information: [address] [place] [address] [phone]        [facility] [place] Go on 06/24/2019.   Why: You are scheduled to start outpatient physical therapy at 1:30. Please arrive at 1:00 to complete your paperwork.  Contact information: [facility] [address] [place] [address] [phone]        Sheral ApleyMurphy, Timothy D, MD.   Specialty: Orthopedic Surgery Contact information: [address] [place] [address] [phone]            Signed: Albina BilletHenry Calvin Martensen III PA-C 06/10/2019, 7:29  AM

## 2019-06-10 NOTE — Care Management CC44 (Signed)
Condition Code 44 Documentation Completed  Patient Details  Name: Kathryn Strickland MRN: [id number] Date of Birth: Jul 25, 1933   Condition Code 44 given:  Yes Patient signature on Condition Code 44 notice:  Yes Documentation of 2 MD's agreement:  Yes Code 44 added to claim:  Yes    Lia Hopping, LCSW 06/10/2019, 10:55 AM

## 2019-06-10 NOTE — Progress Notes (Signed)
Physical Therapy Treatment Patient Details Name: Kathryn NeptuneRuth Rachel Strickland MRN: [id number] DOB: [birth year] Today's Date: 06/10/2019    History of Present Illness R DA-THA; PMH of complete heart block, s/p pacemaker implantation    PT Comments    Pt is progressing well with mobility, she ambulated 100' with RW, practiced stairs, and was instructed in THA HEP. Will plan to review HEP and stairs with pt's son when he arrives as pt seems to have some mild memory deficits.    Follow Up Recommendations  Follow surgeon's recommendation for DC plan and follow-up therapies     Equipment Recommendations  None recommended by PT    Recommendations for Other Services       Precautions / Restrictions Precautions Precautions: Fall Precaution Comments: 1 fall in past 1 year (occurred in May 2020, son thinks it was due to cardiac issues, occurred prior to her pacemaker implantation) Restrictions Weight Bearing Restrictions: No Other Position/Activity Restrictions: WBAT    Mobility  Bed Mobility Overal bed mobility: Needs Assistance Bed Mobility: Supine to Sit     Supine to sit: Min assist     General bed mobility comments: assist to raise trunk and pivot hips with pad  Transfers Overall transfer level: Needs assistance Equipment used: Rolling walker (2 wheeled) Transfers: Sit to/from Stand Sit to Stand: From elevated surface;Min guard         General transfer comment: VCs hand placement  Ambulation/Gait Ambulation/Gait assistance: Min guard Gait Distance (Feet): 100 Feet Assistive device: Rolling walker (2 wheeled) Gait Pattern/deviations: Step-to pattern;Decreased stride length Gait velocity: decr   General Gait Details: VCs sequencing, no loss of balance   Stairs Stairs: Yes Stairs assistance: Min guard Stair Management: One rail Right;Step to pattern;Forwards;With cane Number of Stairs: 3 General stair comments: VCs sequencing   Wheelchair Mobility    Modified  [place] (Stroke Patients Only)       Balance Overall balance assessment: Modified Independent                                          Cognition Arousal/Alertness: Awake/alert Behavior During Therapy: WFL for tasks assessed/performed Overall Cognitive Status: Within Functional Limits for tasks assessed                                        Exercises Total Joint Exercises Ankle Circles/Pumps: AROM;Both;10 reps;Supine Quad Sets: AROM;Right;5 reps;Supine Short Arc Quad: AROM;Right;10 reps;Supine Heel Slides: AAROM;Right;10 reps;Supine Hip ABduction/ADduction: AAROM;Right;10 reps;Supine Long Arc Quad: AROM;Right;10 reps;Seated    General Comments        Pertinent Vitals/Pain Pain Assessment: Faces Faces Pain Scale: Hurts little more Pain Location: R hip with activity Pain Descriptors / Indicators: Grimacing Pain Intervention(s): Limited activity within patient's tolerance;Monitored during session;Premedicated before session;Ice applied    Home Living                      Prior Function            PT Goals (current goals can now be found in the care plan section) Acute Rehab PT Goals Patient Stated Goal: to walk farther PT Goal Formulation: With patient/family Time For Goal Achievement: 06/16/19 Potential to Achieve Goals: Good Progress towards PT goals: Progressing toward goals    Frequency    7X/week  PT Plan Current plan remains appropriate    Co-evaluation              AM-PAC PT "6 Clicks" Mobility   Outcome Measure  Help needed turning from your back to your side while in a flat bed without using bedrails?: A Little Help needed moving from lying on your back to sitting on the side of a flat bed without using bedrails?: A Little Help needed moving to and from a bed to a chair (including a wheelchair)?: A Little Help needed standing up from a chair using your arms (e.g., wheelchair or bedside  chair)?: A Little Help needed to walk in hospital room?: A Little Help needed climbing 3-5 steps with a railing? : A Little 6 Click Score: 18    End of Session Equipment Utilized During Treatment: Gait belt Activity Tolerance: Patient tolerated treatment well;No increased pain Patient left: in chair;with call bell/phone within reach;with chair alarm set Nurse Communication: Mobility status PT Visit Diagnosis: Muscle weakness (generalized) (M62.81);Difficulty in walking, not elsewhere classified (R26.2);Pain Pain - Right/Left: Right Pain - part of body: Hip     Time: 0925-0958 PT Time Calculation (min) (ACUTE ONLY): 33 min  Charges:  $Gait Training: 8-22 mins $Therapeutic Exercise: 8-22 mins                    Blondell Reveal Kistler PT 06/10/2019  Acute Rehabilitation Services Pager [phone] Office [phone]

## 2019-06-23 ENCOUNTER — Encounter: Payer: Medicare Other | Admitting: Cardiology

## 2019-06-25 ENCOUNTER — Ambulatory Visit (INDEPENDENT_AMBULATORY_CARE_PROVIDER_SITE_OTHER): Payer: Medicare Other | Admitting: *Deleted

## 2019-06-25 DIAGNOSIS — I429 Cardiomyopathy, unspecified: Secondary | ICD-10-CM

## 2019-06-25 LAB — CUP PACEART REMOTE DEVICE CHECK
Battery Remaining Longevity: 109 mo
Battery Voltage: 3.12 V
Brady Statistic AP VP Percent: 1.6 %
Brady Statistic AP VS Percent: 0 %
Brady Statistic AS VP Percent: 96.03 %
Brady Statistic AS VS Percent: 2.37 %
Brady Statistic RA Percent Paced: 1.91 %
Brady Statistic RV Percent Paced: 97.63 %
Date Time Interrogation Session: 20200923233559
Implantable Lead Implant Date: 20200624
Implantable Lead Implant Date: 20200624
Implantable Lead Location: 753859
Implantable Lead Location: 753860
Implantable Lead Model: 5076
Implantable Lead Model: 5076
Implantable Pulse Generator Implant Date: 20200624
Lead Channel Impedance Value: 285 Ohm
Lead Channel Impedance Value: 342 Ohm
Lead Channel Impedance Value: 437 Ohm
Lead Channel Impedance Value: 456 Ohm
Lead Channel Pacing Threshold Amplitude: 0.5 V
Lead Channel Pacing Threshold Amplitude: 0.625 V
Lead Channel Pacing Threshold Pulse Width: 0.4 ms
Lead Channel Pacing Threshold Pulse Width: 0.4 ms
Lead Channel Sensing Intrinsic Amplitude: 3.25 mV
Lead Channel Setting Pacing Amplitude: 3.25 V
Lead Channel Setting Pacing Amplitude: 3.25 V
Lead Channel Setting Pacing Pulse Width: 0.4 ms
Lead Channel Setting Sensing Sensitivity: 2.8 mV

## 2019-07-01 ENCOUNTER — Encounter: Payer: Self-pay | Admitting: Cardiology

## 2019-07-01 NOTE — Progress Notes (Signed)
Remote pacemaker transmission.   

## 2019-07-14 ENCOUNTER — Other Ambulatory Visit: Payer: Self-pay

## 2019-07-14 ENCOUNTER — Encounter: Payer: Self-pay | Admitting: Cardiology

## 2019-07-14 ENCOUNTER — Ambulatory Visit (INDEPENDENT_AMBULATORY_CARE_PROVIDER_SITE_OTHER): Payer: Medicare Other | Admitting: Cardiology

## 2019-07-14 VITALS — BP 128/68 | HR 72 | Ht 67.0 in | Wt 113.0 lb

## 2019-07-14 DIAGNOSIS — I442 Atrioventricular block, complete: Secondary | ICD-10-CM | POA: Diagnosis not present

## 2019-07-14 NOTE — Progress Notes (Signed)
Electrophysiology Office Note   Date:  07/14/2019   ID:  Kathryn Strickland, DOB 27-Jul-1933, MRN [id number]  PCP:  Philip Aspen, Limmie Patricia, MD  Cardiologist:  Elio Forget  Primary Electrophysiologist:  Barabara Motz Jorja Loa, MD    Chief Complaint: complete heart block   History of Present Illness: Kathryn Strickland is a 85 y.o. female who is being seen today for the evaluation of pacemkaer at the request of Philip Aspen, Almira Bar*. Presenting today for electrophysiology evaluation.  History of hypertension, hyperlipidemia, CHF, and complete heart block and is now status post Medtronic dual-chamber pacemaker implanted 03/25/2019.  Today, she denies symptoms of palpitations, chest pain, shortness of breath, orthopnea, PND, lower extremity edema, claudication, dizziness, presyncope, syncope, bleeding, or neurologic sequela. The patient is tolerating medications without difficulties.    Past Medical History:  Diagnosis Date  . Arthritis    Hips,Knees   . Atrial fibrillation (HCC) 04/07/2019  . Cardiomyopathy (HCC) 04/20/2019   EF 45% with apical WMA- no history of CAD or symptoms of angina  . Carotid bruit    LATERAL UNSPECIFIED  . Chronic pain   . Complete heart block (HCC) 03/2019  . CRI (chronic renal insufficiency), stage 3 (moderate) 04/20/2019  . Dementia (HCC)    Short term memory  . Genital disorder, female   . Hypercalcemia   . Hypercholesterolemia   . Hyperlipidemia   . Hypertension   . Osteoporosis   . Postmenopausal   . Presence of permanent cardiac pacemaker 03/25/2019   for complete heart block  . Primary osteoarthritis of right hip 05/18/2019  . Vitamin D deficiency    Past Surgical History:  Procedure Laterality Date  . COLONOSCOPY    . PACEMAKER IMPLANT N/A 03/25/2019   Procedure: PACEMAKER IMPLANT;  Surgeon: Regan Lemming, MD;  Location: [facility];  Service: Cardiovascular;  Laterality: N/A;  . TOTAL HIP ARTHROPLASTY Right 06/09/2019    Procedure: TOTAL HIP ARTHROPLASTY ANTERIOR APPROACH;  Surgeon: Sheral Apley, MD;  Location: [facility] OR[facility];  Service: Orthopedics;  Laterality: Right;     Current Outpatient Medications  Medication Sig Dispense Refill  . apixaban (ELIQUIS) 2.5 MG TABS tablet Take 2.5 mg by mouth 2 (two) times daily.    . baclofen (LIORESAL) 10 MG tablet Take 1 tablet (10 mg total) by mouth 2 (two) times daily as needed for muscle spasms. 15 each 0  . Cholecalciferol (VITAMIN D-3) 25 MCG (1000 UT) CAPS Take 1,000 Units by mouth daily with breakfast.    . enalapril (VASOTEC) 10 MG tablet Take 10 mg by mouth daily.    . hydrochlorothiazide (HYDRODIURIL) 12.5 MG tablet Take 12.5 mg by mouth daily.    Marland Kitchen HYDROcodone-acetaminophen (NORCO) 5-325 MG tablet Take 1-2 tablets by mouth every 6 (six) hours as needed for severe pain (Use Tylenol for Mild and Moderate Pain). 20 tablet 0  . ondansetron (ZOFRAN) 4 MG tablet Take 1 tablet (4 mg total) by mouth every 8 (eight) hours as needed for nausea or vomiting. 10 tablet 0   No current facility-administered medications for this visit.     Allergies:   Patient has no known allergies.   Social History:  The patient  reports that she has never smoked. She has never used smokeless tobacco. She reports that she does not drink alcohol or use drugs.   Family History:  The patient's family history includes CAD (age of onset: 67) in her father; Colon cancer (age of onset: 50) in her mother; Heart  disease in her father.    ROS:  Please see the history of present illness.   Otherwise, review of systems is positive for none.   All other systems are reviewed and negative.    PHYSICAL EXAM: VS:  BP 128/68   Pulse 72   Ht 5\' 7"  (1.702 m)   Wt 113 lb (51.3 kg)   BMI 17.70 kg/m  , BMI Body mass index is 17.7 kg/m. GEN: Well nourished, well developed, in no acute distress  HEENT: normal  Neck: no JVD, carotid bruits, or masses Cardiac: RRR; no murmurs, rubs, or gallops,no edema   Respiratory:  clear to auscultation bilaterally, normal work of breathing GI: soft, nontender, nondistended, + BS MS: no deformity or atrophy  Skin: warm and dry, device pocket is well healed Neuro:  Strength and sensation are intact Psych: euthymic mood, full affect  EKG:  EKG is ordered today. Personal review of the ekg ordered shows sinus rhythm, ventricular paced  Device interrogation is reviewed today in detail.  See PaceArt for details.   Recent Labs: 03/24/2019: TSH 0.448 06/02/2019: BUN 24; Creatinine, Ser 0.88; Hemoglobin 13.0; Platelets 216; Potassium 4.2; Sodium 138    Lipid Panel  No results found for: CHOL, TRIG, HDL, CHOLHDL, VLDL, LDLCALC, LDLDIRECT   Wt Readings from Last 3 Encounters:  07/14/19 113 lb (51.3 kg)  06/09/19 111 lb 4 oz (50.5 kg)  06/02/19 111 lb 4 oz (50.5 kg)      Other studies Reviewed: Additional studies/ records that were reviewed today include: TTE 05/05/19  Review of the above records today demonstrates:   1. The left ventricle has normal systolic function with an ejection fraction of 60-65%. The cavity size was normal. Left ventricular diastolic Doppler parameters are consistent with pseudonormalization.  2. The right ventricle has normal systolic function. The cavity was normal. There is no increase in right ventricular wall thickness.  3. Tricuspid valve regurgitation is moderate.  4. The aortic valve is abnormal. Mild thickening of the aortic valve. No stenosis of the aortic valve.  5. The aorta is normal in size and structure.  6. The aortic root and ascending aorta are normal in size and structure.  7. Grossly normal.   ASSESSMENT AND PLAN:  1.  Complete heart block: Status post Medtronic dual-chamber pacemaker implanted 03/25/2019.  Device functioning appropriately.  No changes.  2.  Hypertension: well controlled  3.  Paroxysmal atrial fibrillation: Minimally symptomatic.  Currently on Eliquis.  No changes.  This patients  CHA2DS2-VASc Score and unadjusted Ischemic Stroke Rate (% per year) is equal to 4.8 % stroke rate/year from a score of 4  Above score calculated as 1 point each if present [CHF, HTN, DM, Vascular=MI/PAD/Aortic Plaque, Age if 65-74, or Female] Above score calculated as 2 points each if present [Age > 75, or Stroke/TIA/TE]   Current medicines are reviewed at length with the patient today.   The patient does not have concerns regarding her medicines.  The following changes were made today:  none  Labs/ tests ordered today include:  Orders Placed This Encounter  Procedures  . EKG 12-Lead     Disposition:   FU with Clifford Benninger 9 months  Signed, Emmely Bittinger Meredith Leeds, MD  07/14/2019 4:43 PM     [address] [place] [address] [phone] (office) [phone] (fax)

## 2019-07-14 NOTE — Patient Instructions (Signed)
Medication Instructions:  Your physician recommends that you continue on your current medications as directed. Please refer to the Current Medication list given to you today.  *If you need a refill on your cardiac medications before your next appointment, please call your pharmacy*  Labwork: None ordered If you have labs (blood work) drawn today and your tests are completely normal, you will receive your results only by:  [place] (if you have MyChart) OR  A paper copy in the mail If you have any lab test that is abnormal or we need to change your treatment, we will call you to review the results.  Testing/Procedures: None ordered  Follow-Up: Remote monitoring is used to monitor your Pacemaker or ICD from home. This monitoring reduces the number of office visits required to check your device to one time per year. It allows [place] to keep an eye on the functioning of your device to ensure it is working properly. You are scheduled for a device check from home on 09/24/19. You may send your transmission at any time that day. If you have a wireless device, the transmission will be sent automatically. After your physician reviews your transmission, you will receive a postcard with your next transmission date.  At [facility], you and your health needs are our priority.  As part of our continuing mission to provide you with exceptional heart care, we have created designated Provider Care Teams.  These Care Teams include your primary Cardiologist (physician) and Advanced Practice Providers (APPs -  Physician Assistants and Nurse Practitioners) who all work together to provide you with the care you need, when you need it.  You will need a follow up appointment in 9 months.  Please call our office 2 months in advance to schedule this appointment.  You may see Dr Curt Bears or one of the following Advanced Practice Providers on your designated Care Team:    Chanetta Marshall, NP  Tommye Standard, PA-C   Oda Kilts, [place]  Thank you for choosing [facility]!!   Trinidad Curet, RN [phone]  Any Other Special Instructions Will Be Listed Below (If Applicable).

## 2019-07-23 ENCOUNTER — Telehealth: Payer: Self-pay | Admitting: *Deleted

## 2019-07-23 NOTE — Telephone Encounter (Signed)
Patient with diagnosis of afib on Eliquis for anticoagulation.    Procedure: LEFT TOTAL HIP REPLACEMENT  Date of procedure: 10/27/2019  CHADS2-VASc score of  5 (CHF, HTN, AGE, AGE, female)  CrCl 38 ml/min  Per office protocol, patient can hold Eliquis for 3 days prior to procedure.

## 2019-07-23 NOTE — Telephone Encounter (Signed)
   [facility] Pre-operative Risk Assessment    Request for surgical clearance:  1. What type of surgery is being performed? LEFT TOTAL HIP REPLACEMENT   2. When is this surgery scheduled? 10/27/19   3. What type of clearance is required (medical clearance vs. Pharmacy clearance to hold med vs. Both)? BOTH  4. Are there any medications that need to be held prior to surgery and how long? ELIQUIS   5. Practice name and name of physician performing surgery? [facility]; DR. Christia Reading [facility]  6. What is your office phone number [phone]    7.   What is your office fax number [phone]  8.   Anesthesia type (None, local, MAC, general) ? SPINAL   Julaine Hua 07/23/2019, 11:21 AM  _________________________________________________________________   (provider comments below)

## 2019-07-23 NOTE — Telephone Encounter (Signed)
   Primary Cardiologist: Will Meredith Leeds, MD  Chart reviewed as part of pre-operative protocol coverage. Given past medical history and time since last visit, based on ACC/AHA guidelines, Kathryn Strickland would be at acceptable risk for the planned procedure without further cardiovascular testing.   Pt was cleared for surgery by Dr. Debara Pickett on 06/01/2019. She was also seen in follow up by Dr. Curt Bears on 07/14/19 and was doing well from a PPM/CHB perspective. No adjustments were made at that time.   Per pharmacy, patient with diagnosis of afib on Eliquis for anticoagulation with a CHADS2-VASc score of  5 (CHF, HTN, AGE, AGE, female) and CrCl 38 ml/min  Per office protocol, patient can hold Eliquis for 3 days prior to procedure.    I will route this recommendation to the requesting party via Epic fax function and remove from pre-op pool.  Please call with questions.  [name], NP 07/23/2019, 1:26 PM

## 2019-08-12 IMAGING — CR CHEST - 2 VIEW
2 series · 2 of 2 positions shown · non-contrast
Comparison: None.

CLINICAL DATA: Status post pacing device placement.

EXAM:
CHEST - 2 VIEW

[chest ap]
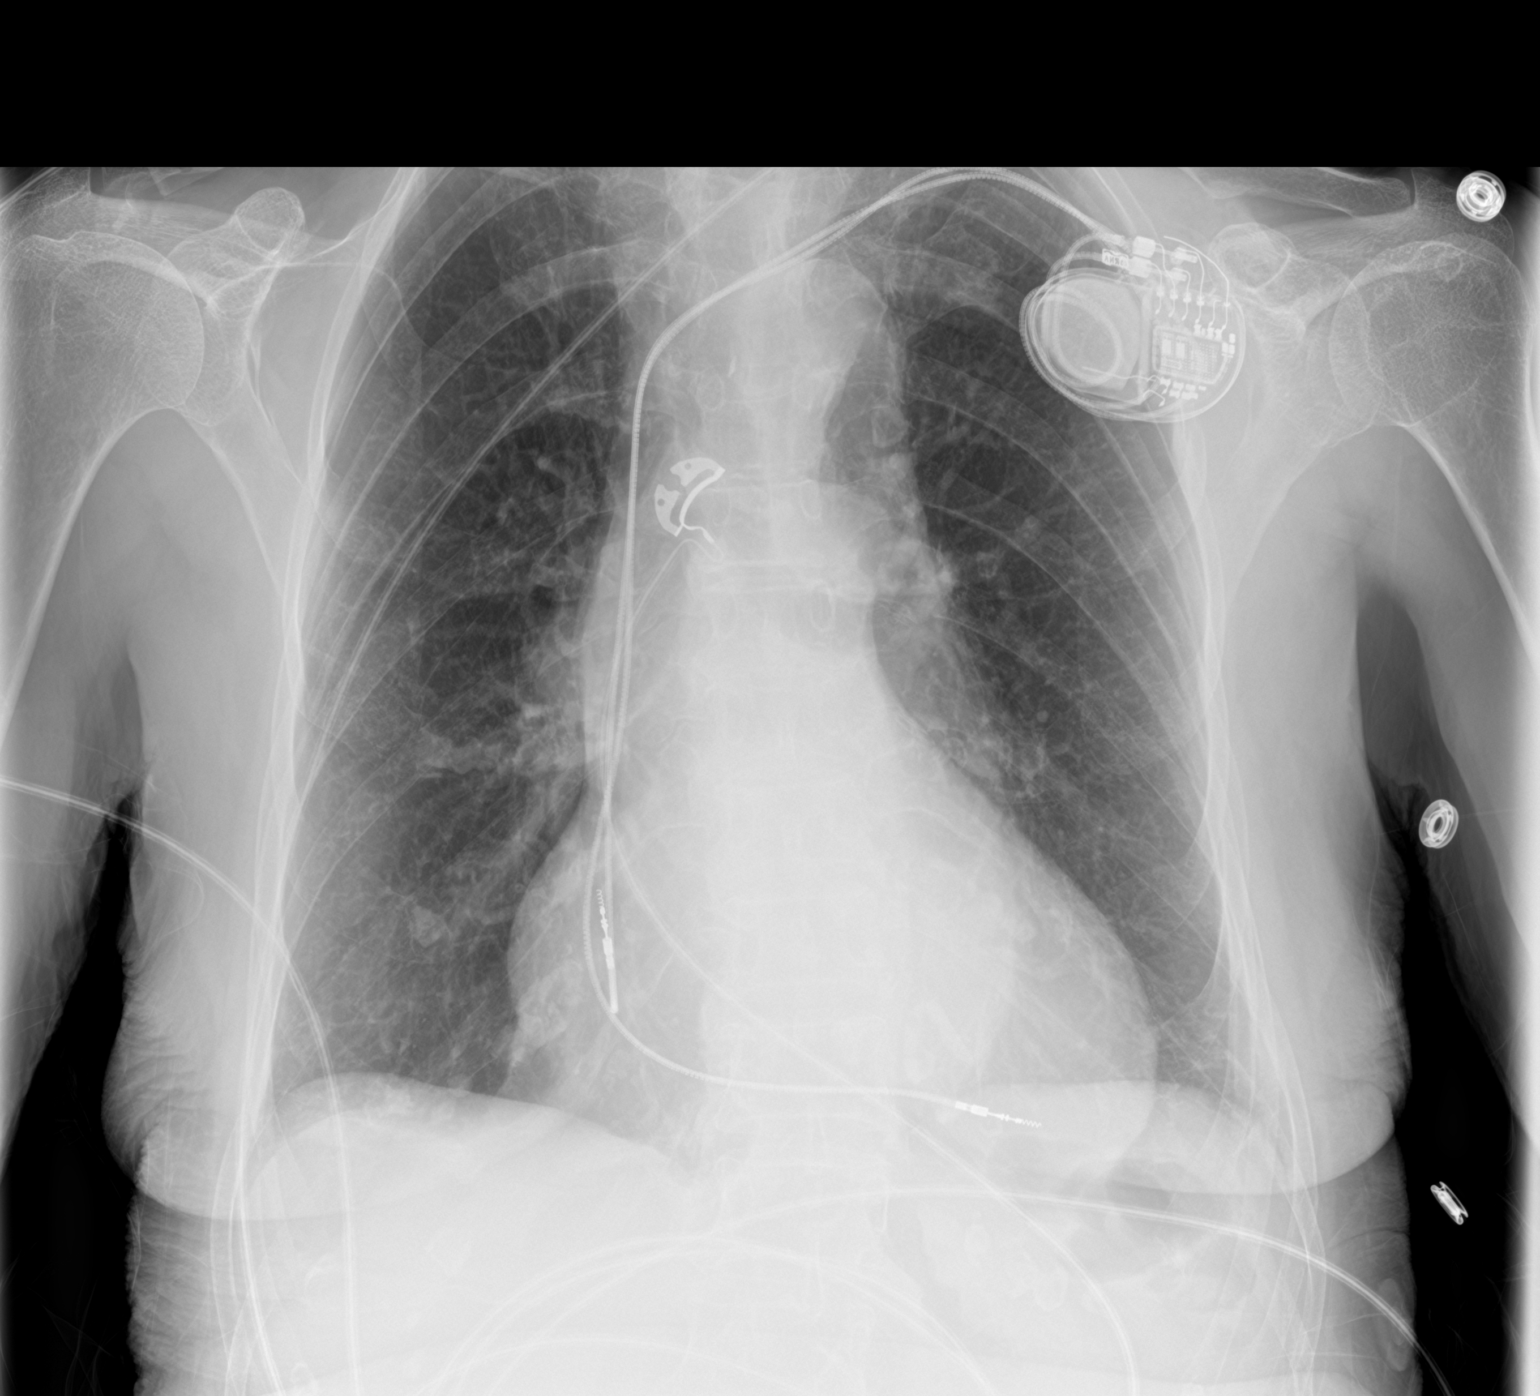

[chest lat]
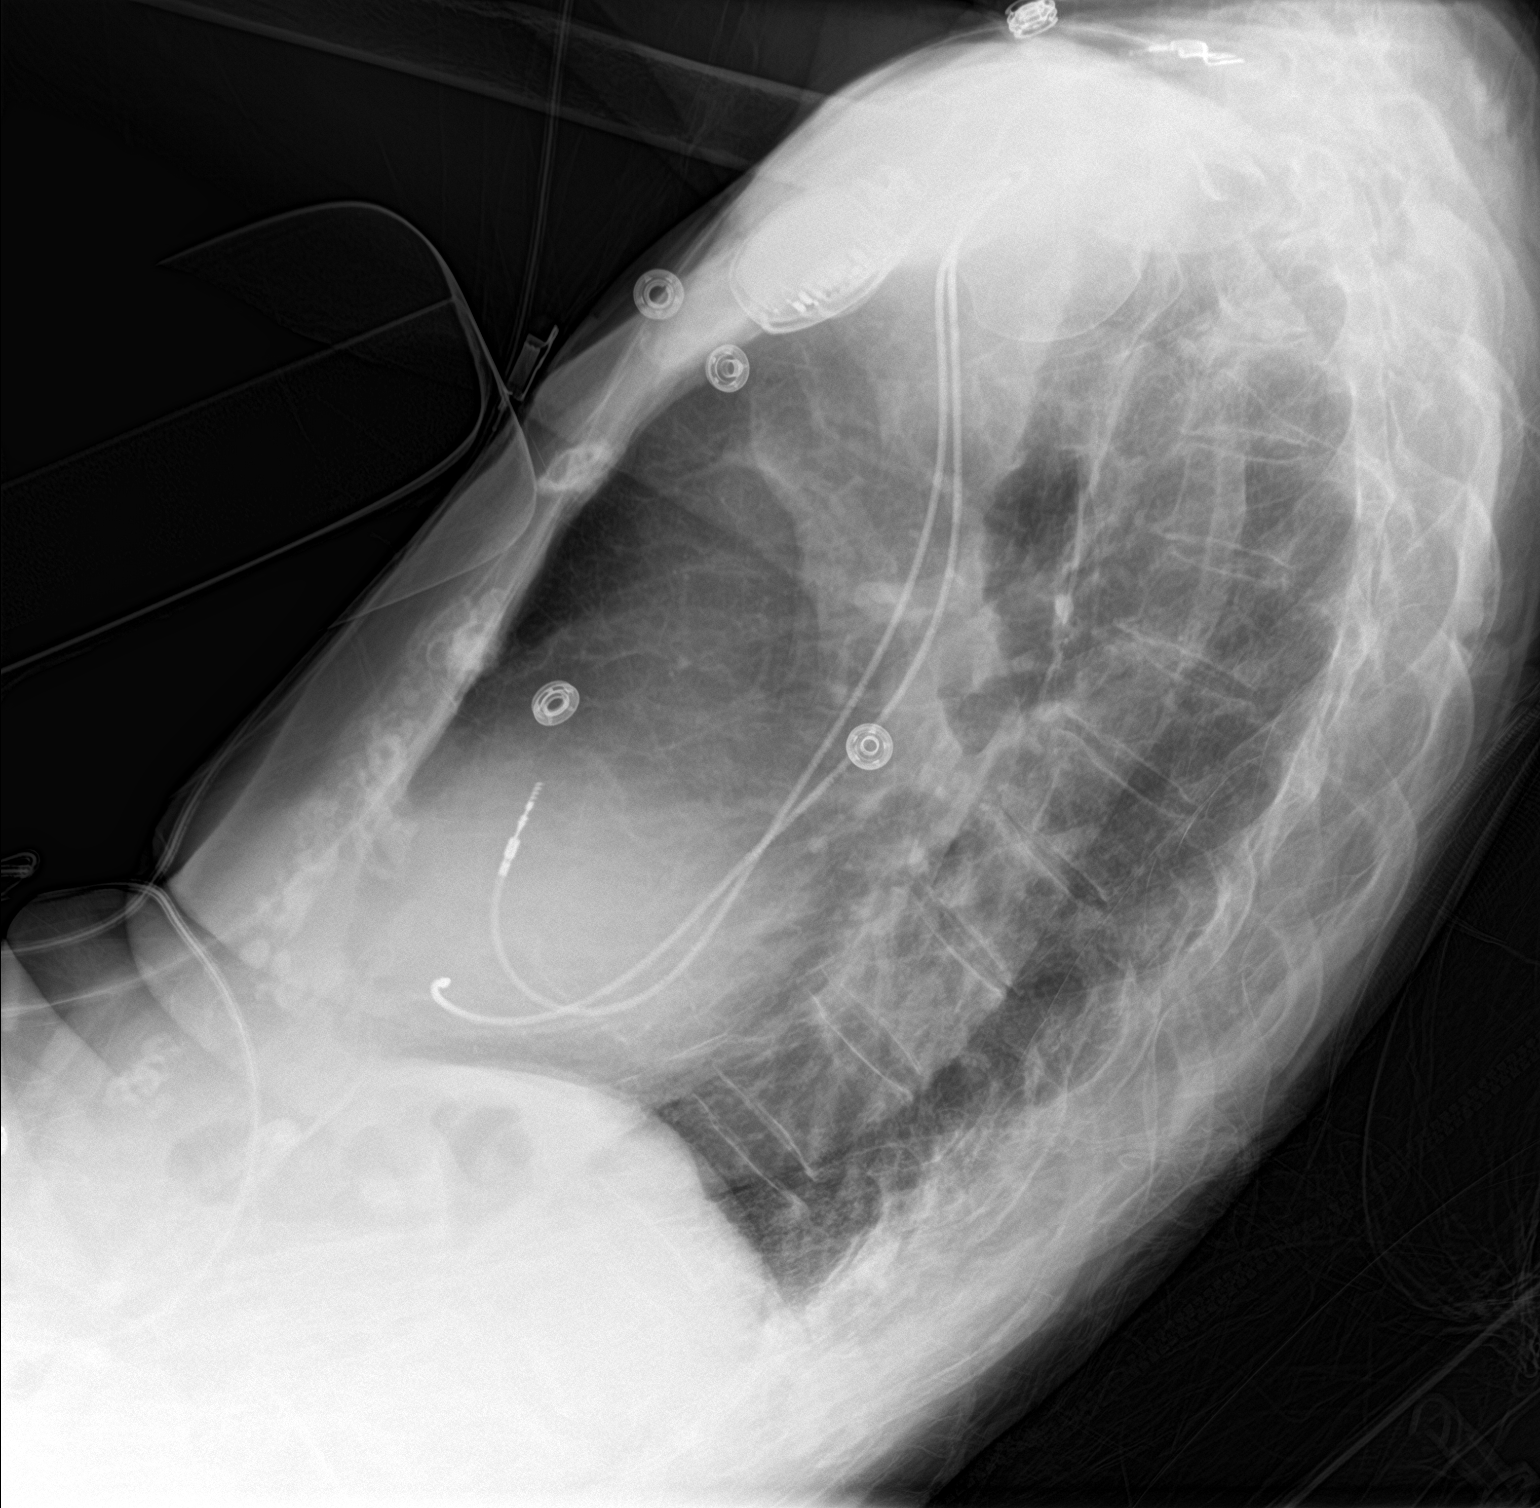

[2 of 2 positions shown; findings below may reference images not displayed]

FINDINGS: Pacing device is in place. Generator is over the left upper chest.
Leads are intact with their tips in the right atrium and apex of the
right ventricle. Cardiomegaly. Lungs are clear. No pneumothorax or
pleural effusion. Atherosclerosis noted. No acute or focal bony
abnormality.
IMPRESSION: Pacing leads are in the right atrium and right ventricle. No
pneumothorax or acute disease.

Cardiomegaly.

## 2019-09-17 ENCOUNTER — Ambulatory Visit (INDEPENDENT_AMBULATORY_CARE_PROVIDER_SITE_OTHER): Payer: Medicare Other | Admitting: Internal Medicine

## 2019-09-17 ENCOUNTER — Other Ambulatory Visit: Payer: Self-pay

## 2019-09-17 ENCOUNTER — Encounter: Payer: Self-pay | Admitting: Internal Medicine

## 2019-09-17 VITALS — BP 148/90 | HR 85 | Temp 97.3°F | Wt 120.4 lb

## 2019-09-17 DIAGNOSIS — I10 Essential (primary) hypertension: Secondary | ICD-10-CM

## 2019-09-17 DIAGNOSIS — I442 Atrioventricular block, complete: Secondary | ICD-10-CM | POA: Diagnosis not present

## 2019-09-17 DIAGNOSIS — Z23 Encounter for immunization: Secondary | ICD-10-CM | POA: Diagnosis not present

## 2019-09-17 DIAGNOSIS — M160 Bilateral primary osteoarthritis of hip: Secondary | ICD-10-CM

## 2019-09-17 DIAGNOSIS — N183 Chronic kidney disease, stage 3 unspecified: Secondary | ICD-10-CM

## 2019-09-17 DIAGNOSIS — B351 Tinea unguium: Secondary | ICD-10-CM

## 2019-09-17 NOTE — Patient Instructions (Signed)
-  Nice seeing you today!!  -Check blood pressure 2-3 times a week and call me once you have gathered 2 weeks of values.  -Good luck with your hip surgery.  -Referral to podiatry today.  -Schedule follow up in 6 months.

## 2019-09-17 NOTE — Progress Notes (Signed)
Established Patient Office Visit     This visit occurred during the SARS-CoV-2 public health emergency.  Safety protocols were in place, including screening questions prior to the visit, additional usage of staff PPE, and extensive cleaning of exam room while observing appropriate contact time as indicated for disinfecting solutions.    CC/Reason for Visit: Follow-up chronic conditions, discuss toenail fungus  HPI: Kathryn Strickland is a 85 y.o. female who is coming in today for the above mentioned reasons. Past Medical History is significant for: Well-controlled hypertension on enalapril and hydrochlorothiazide and bilateral hip osteoarthritis.  I first met her back in June for preoperative clearance prior to her right hip replacement.  At that time she was diagnosed with a complete heart block and subsequently underwent pacemaker placement and is followed by Dr. Curt Bears.  She had her right hip replaced in September and did really well with that, she did home health PT for a week and then went to outpatient PT, however her improvement has been stagnant due to her left hip severe OA and she is now scheduled for replacement of her left hip in January.  They just want to make sure she is okay to proceed to surgery.  She also would like me to look at her toenail fungus that she has had for years.  She will receive a flu vaccine today.   Past Medical/Surgical History: Past Medical History:  Diagnosis Date  . Arthritis    Hips,Knees   . Atrial fibrillation ([place]) 04/07/2019  . Cardiomyopathy ([place]) 04/20/2019   EF 45% with apical WMA- no history of CAD or symptoms of angina  . Carotid bruit    LATERAL UNSPECIFIED  . Chronic pain   . Complete heart block ([place]) 03/2019  . CRI (chronic renal insufficiency), stage 3 (moderate) 04/20/2019  . Dementia ([place])    Short term memory  . Genital disorder, female   . Hypercalcemia   . Hypercholesterolemia   . Hyperlipidemia   . Hypertension     . Osteoporosis   . Postmenopausal   . Presence of permanent cardiac pacemaker 03/25/2019   for complete heart block  . Primary osteoarthritis of right hip 05/18/2019  . Vitamin D deficiency     Past Surgical History:  Procedure Laterality Date  . COLONOSCOPY    . PACEMAKER IMPLANT N/A 03/25/2019   Procedure: PACEMAKER IMPLANT;  Surgeon: Constance Haw, MD;  Location: [facility];  Service: Cardiovascular;  Laterality: N/A;  . TOTAL HIP ARTHROPLASTY Right 06/09/2019   Procedure: TOTAL HIP ARTHROPLASTY ANTERIOR APPROACH;  Surgeon: Renette Butters, MD;  Location: [facility];  Service: Orthopedics;  Laterality: Right;    Social History:  reports that she has never smoked. She has never used smokeless tobacco. She reports that she does not drink alcohol or use drugs.  Allergies: No Known Allergies  Family History:  Family History  Problem Relation Age of Onset  . Heart disease Father   . CAD Father 67  . Colon cancer Mother 70     Current Outpatient Medications:  .  apixaban (ELIQUIS) 2.5 MG TABS tablet, Take 2.5 mg by mouth 2 (two) times daily., Disp: , Rfl:  .  Cholecalciferol (VITAMIN D-3) 25 MCG (1000 UT) CAPS, Take 1,000 Units by mouth daily with breakfast., Disp: , Rfl:  .  enalapril (VASOTEC) 10 MG tablet, Take 10 mg by mouth daily., Disp: , Rfl:  .  hydrochlorothiazide (HYDRODIURIL) 12.5 MG tablet, Take 12.5 mg by  mouth daily., Disp: , Rfl:   Review of Systems:  Constitutional: Denies fever, chills, diaphoresis, appetite change and fatigue.  HEENT: Denies photophobia, eye pain, redness, hearing loss, ear pain, congestion, sore throat, rhinorrhea, sneezing, mouth sores, trouble swallowing, neck pain, neck stiffness and tinnitus.   Respiratory: Denies SOB, DOE, cough, chest tightness,  and wheezing.   Cardiovascular: Denies chest pain, palpitations and leg swelling.  Gastrointestinal: Denies nausea, vomiting, abdominal pain, diarrhea, constipation, blood in stool  and abdominal distention.  Genitourinary: Denies dysuria, urgency, frequency, hematuria, flank pain and difficulty urinating.  Endocrine: Denies: hot or cold intolerance, sweats, changes in hair or nails, polyuria, polydipsia. Musculoskeletal: Denies myalgias, back pain, joint swelling, arthralgias and gait problem.  Skin: Denies pallor, rash and wound.  Neurological: Denies dizziness, seizures, syncope, weakness, light-headedness, numbness and headaches.  Hematological: Denies adenopathy. Easy bruising, personal or family bleeding history  Psychiatric/Behavioral: Denies suicidal ideation, mood changes, confusion, nervousness, sleep disturbance and agitation    Physical Exam: Vitals:   09/17/19 1537  BP: (!) 148/90  Pulse: 85  Temp: (!) 97.3 F (36.3 C)  TempSrc: Temporal  SpO2: 94%  Weight: 120 lb 6.4 oz (54.6 kg)    Body mass index is 18.86 kg/m.   Constitutional: NAD, calm, comfortable Eyes: PERRL, lids and conjunctivae normal, wears corrective lenses ENMT: Mucous membranes are moist.  Respiratory: clear to auscultation bilaterally, no wheezing, no crackles. Normal respiratory effort. No accessory muscle use.  Cardiovascular: Regular rate and rhythm, no murmurs / rubs / gallops. No extremity edema. 2+ pedal pulses.  Abdomen: no tenderness, no masses palpated. No hepatosplenomegaly. Bowel sounds positive.  Musculoskeletal: no clubbing / cyanosis. No joint deformity upper and lower extremities. Good ROM, no contractures. Normal muscle tone.  Skin: All 10 toenails are yellowed and thickened. Psychiatric: Normal judgment and insight. Alert and oriented x 3. Normal mood.    Impression and Plan:  Toenail fungus -I do not think she would be a good candidate for oral Lamisil given her advanced age and potential for liver toxicity. -I will refer to podiatry as I think she could use some toenail clipping to prevent ingrown toenails.  Essential hypertension -Blood pressure is  elevated in office today to 148/90. -Son will do ambulatory blood pressure monitoring and contact me once he has had 2 weeks worth of measurements to see if her antihypertensive therapy needs to be adjusted.  Complete heart block ([place]) -Status post pacemaker and followed by EP.  CRI (chronic renal insufficiency), stage 3 (moderate) -Last creatinine was 0.880 in September.  Bilateral primary osteoarthritis of hip -Had right hip replacement in September and is scheduled for left hip replacement in January.    Patient Instructions  -Nice seeing you today!!  -Check blood pressure 2-3 times a week and call me once you have gathered 2 weeks of values.  -Good luck with your hip surgery.  -Referral to podiatry today.  -Schedule follow up in 6 months.     Lelon Frohlich, MD [facility]

## 2019-09-24 ENCOUNTER — Ambulatory Visit (INDEPENDENT_AMBULATORY_CARE_PROVIDER_SITE_OTHER): Payer: Medicare Other | Admitting: *Deleted

## 2019-09-24 DIAGNOSIS — I442 Atrioventricular block, complete: Secondary | ICD-10-CM | POA: Diagnosis not present

## 2019-09-24 LAB — CUP PACEART REMOTE DEVICE CHECK
Battery Remaining Longevity: 124 mo
Battery Voltage: 3.07 V
Brady Statistic AP VP Percent: 1.83 %
Brady Statistic AP VS Percent: 0 %
Brady Statistic AS VP Percent: 95.98 %
Brady Statistic AS VS Percent: 2.18 %
Brady Statistic RA Percent Paced: 2.38 %
Brady Statistic RV Percent Paced: 97.82 %
Date Time Interrogation Session: 20201223035007
Implantable Lead Implant Date: 20200624
Implantable Lead Implant Date: 20200624
Implantable Lead Location: 753859
Implantable Lead Location: 753860
Implantable Lead Model: 5076
Implantable Lead Model: 5076
Implantable Pulse Generator Implant Date: 20200624
Lead Channel Impedance Value: 323 Ohm
Lead Channel Impedance Value: 323 Ohm
Lead Channel Impedance Value: 399 Ohm
Lead Channel Impedance Value: 399 Ohm
Lead Channel Pacing Threshold Amplitude: 0.5 V
Lead Channel Pacing Threshold Amplitude: 0.75 V
Lead Channel Pacing Threshold Pulse Width: 0.4 ms
Lead Channel Pacing Threshold Pulse Width: 0.4 ms
Lead Channel Sensing Intrinsic Amplitude: 3.5 mV
Lead Channel Sensing Intrinsic Amplitude: 3.5 mV
Lead Channel Setting Pacing Amplitude: 1.5 V
Lead Channel Setting Pacing Amplitude: 2.5 V
Lead Channel Setting Pacing Pulse Width: 0.4 ms
Lead Channel Setting Sensing Sensitivity: 2.8 mV

## 2019-09-26 NOTE — Progress Notes (Signed)
PPM remote 

## 2019-09-29 ENCOUNTER — Other Ambulatory Visit: Payer: Self-pay

## 2019-09-29 ENCOUNTER — Ambulatory Visit (INDEPENDENT_AMBULATORY_CARE_PROVIDER_SITE_OTHER): Payer: Medicare Other | Admitting: Podiatry

## 2019-09-29 VITALS — BP 122/80 | HR 67 | Temp 97.2°F

## 2019-09-29 DIAGNOSIS — M79675 Pain in left toe(s): Secondary | ICD-10-CM | POA: Diagnosis not present

## 2019-09-29 DIAGNOSIS — B351 Tinea unguium: Secondary | ICD-10-CM

## 2019-09-29 DIAGNOSIS — M79674 Pain in right toe(s): Secondary | ICD-10-CM | POA: Diagnosis not present

## 2019-09-29 DIAGNOSIS — Z7901 Long term (current) use of anticoagulants: Secondary | ICD-10-CM | POA: Diagnosis not present

## 2019-09-30 NOTE — Progress Notes (Signed)
Subjective:   Patient ID: Kathryn Strickland, female   DOB: 85 y.o.   MRN: [id number]   HPI 85 year old female presents the office today for concerns of thick, painful, elongated toenails that she cannot trim her self.  She previously had seen her primary care physician who did not recommend Lamisil.  She presents here for nail debridement.  No other concerns.   Review of Systems  All other systems reviewed and are negative.  Past Medical History:  Diagnosis Date  . Arthritis    Hips,Knees   . Atrial fibrillation ([place]) 04/07/2019  . Cardiomyopathy ([place]) 04/20/2019   EF 45% with apical WMA- no history of CAD or symptoms of angina  . Carotid bruit    LATERAL UNSPECIFIED  . Chronic pain   . Complete heart block ([place]) 03/2019  . CRI (chronic renal insufficiency), stage 3 (moderate) 04/20/2019  . Dementia ([place])    Short term memory  . Genital disorder, female   . Hypercalcemia   . Hypercholesterolemia   . Hyperlipidemia   . Hypertension   . Osteoporosis   . Postmenopausal   . Presence of permanent cardiac pacemaker 03/25/2019   for complete heart block  . Primary osteoarthritis of right hip 05/18/2019  . Vitamin D deficiency     Past Surgical History:  Procedure Laterality Date  . COLONOSCOPY    . PACEMAKER IMPLANT N/A 03/25/2019   Procedure: PACEMAKER IMPLANT;  Surgeon: Constance Haw, MD;  Location: [facility];  Service: Cardiovascular;  Laterality: N/A;  . TOTAL HIP ARTHROPLASTY Right 06/09/2019   Procedure: TOTAL HIP ARTHROPLASTY ANTERIOR APPROACH;  Surgeon: Renette Butters, MD;  Location: [facility];  Service: Orthopedics;  Laterality: Right;     Current Outpatient Medications:  .  apixaban (ELIQUIS) 2.5 MG TABS tablet, Take 2.5 mg by mouth 2 (two) times daily., Disp: , Rfl:  .  Cholecalciferol (VITAMIN D-3) 25 MCG (1000 UT) CAPS, Take 1,000 Units by mouth daily with breakfast., Disp: , Rfl:  .  enalapril (VASOTEC) 10 MG tablet, Take 10 mg by mouth daily.,  Disp: , Rfl:  .  hydrochlorothiazide (HYDRODIURIL) 12.5 MG tablet, Take 12.5 mg by mouth daily., Disp: , Rfl:   No Known Allergies       Objective:  Physical Exam  General: AAO x3, NAD  Dermatological: Nails are hypertrophic, dystrophic, brittle, discolored, elongated 10. No surrounding redness or drainage. Tenderness nails 1-5 bilaterally. No open lesions or pre-ulcerative lesions are identified today.  Vascular: DP pulses 2/4, PT pulses 1/4.  CRT less than 3 seconds.  Neruologic: Grossly intact via light touch bilateral. Protective threshold with Semmes Wienstein monofilament intact to all pedal sites bilateral.   Musculoskeletal: No gross boney pedal deformities bilateral. No pain, crepitus, or limitation noted with foot and ankle range of motion bilateral. Muscular strength 5/5 in all groups tested bilateral.     Assessment:   Symptomatic onychomycosis     Plan:  -Treatment options discussed including all alternatives, risks, and complications -Etiology of symptoms were discussed -Nails debrided 10 without complications or bleeding. -Daily foot inspection -Follow-up in 3 months or sooner if any problems arise. In the meantime, encouraged to call the office with any questions, concerns, change in symptoms.   Celesta Gentile, DPM

## 2019-10-05 DIAGNOSIS — M1612 Unilateral primary osteoarthritis, left hip: Secondary | ICD-10-CM | POA: Diagnosis present

## 2019-10-05 NOTE — H&P (Signed)
HIP ARTHROPLASTY ADMISSION H&P  Patient ID: Kathryn Strickland MRN: [id number] DOB/AGE: 01/10/1933 86 y.o.  Chief Complaint: left hip pain.  Planned Procedure Date: 10/26/2018 Medical Clearance by Dr. Jerilee Hoh   Cardiac Clearance by Dr. Debara Pickett  HPI: Kathryn Strickland is a 86 y.o. female with a history of right total hip arthroplasty September 2020, pacemaker placement for complete heart block June 2020, vitamin D deficiency, hyperlipidemia, chronic renal insufficiency, and hypertension who presents for evaluation of OSTEOARTHRITIS LEFT HIP. The patient has a history of pain and functional disability in the left hip due to arthritis and has failed non-surgical conservative treatments for greater than 12 weeks to include NSAID's and/or analgesics, use of assistive devices and activity modification.  Onset of symptoms was gradual, starting >10 years ago with gradually worsening course since that time.  Patient currently rates pain at 7 out of 10 with activity. Patient has night pain, worsening of pain with activity and weight bearing and pain that interferes with activities of daily living.  Patient has evidence of subchondral cysts, subchondral sclerosis, periarticular osteophytes and joint space narrowing by imaging studies.  There is no active infection.  Past Medical History:  Diagnosis Date  . Arthritis    Hips,Knees   . Atrial fibrillation ([place]) 04/07/2019  . Cardiomyopathy ([place]) 04/20/2019   EF 45% with apical WMA- no history of CAD or symptoms of angina  . Carotid bruit    LATERAL UNSPECIFIED  . Chronic pain   . Complete heart block ([place]) 03/2019  . CRI (chronic renal insufficiency), stage 3 (moderate) 04/20/2019  . Dementia ([place])    Short term memory  . Genital disorder, female   . Hypercalcemia   . Hypercholesterolemia   . Hyperlipidemia   . Hypertension   . Osteoporosis   . Postmenopausal   . Presence of permanent cardiac pacemaker 03/25/2019   for complete heart block   . Primary osteoarthritis of right hip 05/18/2019  . Vitamin D deficiency    Past Surgical History:  Procedure Laterality Date  . COLONOSCOPY    . PACEMAKER IMPLANT N/A 03/25/2019   Procedure: PACEMAKER IMPLANT;  Surgeon: Constance Haw, MD;  Location: [facility];  Service: Cardiovascular;  Laterality: N/A;  . TOTAL HIP ARTHROPLASTY Right 06/09/2019   Procedure: TOTAL HIP ARTHROPLASTY ANTERIOR APPROACH;  Surgeon: Renette Butters, MD;  Location: [facility];  Service: Orthopedics;  Laterality: Right;   No Known Allergies   Prior to Admission medications   Medication Sig Start Date End Date Taking? Authorizing Provider  apixaban (ELIQUIS) 2.5 MG TABS tablet Take 2.5 mg by mouth 2 (two) times daily.    [provider]  Cholecalciferol (VITAMIN D-3) 25 MCG (1000 UT) CAPS Take 1,000 Units by mouth daily with breakfast.    [provider]  enalapril (VASOTEC) 10 MG tablet Take 10 mg by mouth daily.    [provider]  hydrochlorothiazide (HYDRODIURIL) 12.5 MG tablet Take 12.5 mg by mouth daily. 05/05/19   [provider]   Social history: Widowed, retired Pharmacist, hospital.  No alcohol or tobacco use.  Family History  Problem Relation Age of Onset  . Heart disease Father   . CAD Father 40  . Colon cancer Mother 31    ROS: Currently denies lightheadedness, dizziness, Fever, chills, CP, SOB.  No personal history of DVT, PE, MI, or CVA. No loose teeth or dentures All other systems have been reviewed and were otherwise currently negative with the exception of those mentioned in  the HPI and as above.  Objective: Vitals: Ht: 5 feet 5 inches Wt: 122 lbs Temp: 97.0 BP: 93/60 Pulse: 76 O2 98% on room air.   Physical Exam: General: Alert, NAD. Trendelenberg Gait.  Uses a rolling walker  HEENT: EOMI, Good Neck Extension  Pulm: No increased work of breathing.  Clear B/L A/P w/o crackle or wheeze.  CV: Regular Rate, No m/g/r appreciated  GI: soft, NT, ND Neuro:  Neuro without gross focal deficit.  Sensation intact distally Skin: No lesions in the area of chief complaint MSK/Surgical Site: Left Hip pain with passive ROM.  Positive Stinchfield.  4/5 strength.  NVI.    Imaging Review Plain radiographs demonstrate severe degenerative joint disease of the left hip.   Preoperative templating of the joint replacement has been completed, documented, and submitted to the Operating Room personnel in order to optimize intra-operative equipment management.  Assessment: OSTEOARTHRITIS LEFT HIP Principal Problem:   Primary osteoarthritis of left hip Active Problems:   Hypertension   Complete heart block (H[facility])   Cardiomyopathy (H[facility])   CRI (chronic renal insufficiency), stage 3 (moderate)   Atrial fibrillation (H[facility])   Plan: Plan for Procedure(s): TOTAL HIP ARTHROPLASTY ANTERIOR APPROACH  The patient history, physical exam, clinical judgement of the provider and imaging are consistent with end stage degenerative joint disease and total joint arthroplasty is deemed medically necessary. The treatment options including medical management, injection therapy, and arthroplasty were discussed at length. The risks and benefits of Procedure(s): TOTAL HIP ARTHROPLASTY ANTERIOR APPROACH were presented and reviewed.  The risks of nonoperative treatment, versus surgical intervention including but not limited to continued pain, aseptic loosening, stiffness, dislocation/subluxation, infection, bleeding, nerve injury, blood clots, cardiopulmonary complications, morbidity, mortality, among others were discussed. The patient verbalizes understanding and wishes to proceed with the plan.  Patient is being admitted for surgery, pain control, PT, prophylactic antibiotics, VTE prophylaxis, progressive ambulation, ADL's and discharge planning.   Dental prophylaxis discussed and recommended for 2 years postoperatively.   The patient does meet the criteria for TXA which will be used  perioperatively.    Eliquis 2.5 mg BID will be resumed postoperatively for DVT prophylaxis in addition to SCDs, and early ambulation.  Cardiology recommends stopping this medicine 3 days prior to surgery.  Plan for Tylenol or Norco for pain.  Baclofen for spasm.  Minimize PO NSAIDs dt renal insufficiency.  The patient is planning to be discharged home with HHPT in care of her son Arlys John.    Anticipated LOS less than 2 midnights.  Inpatient insurance approval due to: - Age 7 and older with one or more of the following             - Expected need for hospital services (PT, OT, Nursing) required for safe   discharge             - Active co-morbidities: Cardiac Arrhythmia   Albina Billet III, PA-C 10/05/2019 7:42 AM

## 2019-10-12 NOTE — Patient Instructions (Addendum)
DUE TO COVID-19 ONLY ONE VISITOR IS ALLOWED TO COME WITH YOU AND STAY IN THE WAITING ROOM ONLY DURING PRE OP AND PROCEDURE DAY OF SURGERY. THE 1 VISITOR MAY VISIT WITH YOU AFTER SURGERY IN YOUR PRIVATE ROOM DURING VISITING HOURS ONLY!  YOU NEED TO HAVE A COVID 19 TEST ON: 10/16/19 @    1:00 pm     , THIS TEST MUST BE DONE BEFORE SURGERY, COME  [address], [place] , [address].  [facility]) ONCE YOUR COVID TEST IS COMPLETED, PLEASE BEGIN THE QUARANTINE INSTRUCTIONS AS OUTLINED IN YOUR HANDOUT.                Kathryn Strickland     Your procedure is scheduled on: 10/20/2019    Report to [facility] Main  Entrance   Report to short stay at: 5:30 AM     Call this number if you have problems the morning of surgery [phone]    Remember:    BRUSH YOUR TEETH MORNING OF SURGERY AND RINSE YOUR MOUTH OUT, NO CHEWING GUM CANDY OR MINTS.     Take these medicines the morning of surgery with A SIP OF WATER:                                   You may not have any metal on your body including hair pins and              piercings  Do not wear jewelry, make-up, lotions, powders or perfumes, deodorant             Do not wear nail polish on your fingernails.  Do not shave  48 hours prior to surgery.              Do not bring valuables to the hospital.       [facility] IS NOT             RESPONSIBLE   FOR VALUABLES.  Contacts, dentures or bridgework may not be worn into surgery.  Leave suitcase in the car. After surgery it may be brought to your room.       Patients discharged the day of surgery will not be allowed to drive home. IF YOU ARE HAVING SURGERY AND GOING HOME THE SAME DAY, YOU MUST HAVE AN ADULT TO DRIVE YOU HOME AND  BE WITH YOU FOR 24 HOURS. YOU MAY GO HOME BY TAXI OR UBER OR ORTHERWISE, BUT AN ADULT MUST ACCOMPANY YOU HOME AND STAY WITH YOU FOR 24 HOURS.  Name and phone number of your driver:  Special Instructions: N/A                           NO SOLID FOOD AFTER MIDNIGHT THE NIGHT PRIOR TO SURGERY. NOTHING BY MOUTH EXCEPT CLEAR LIQUIDS UNTIL: 4:30 am . PLEASE FINISH ENSURE DRINK PER SURGEON ORDER  WHICH NEEDS TO BE COMPLETED AT: 4:30 am .   CLEAR LIQUID DIET   Foods Allowed  Foods Excluded  Coffee and tea, regular and decaf                             liquids that you cannot  Plain Jell-O any favor except red or purple                                           see through such as: Fruit ices (not with fruit pulp)                                     milk, soups, orange juice  Iced Popsicles                                    All solid food Carbonated beverages, regular and diet                                    Cranberry, grape and apple juices Sports drinks like Gatorade Lightly seasoned clear broth or consume(fat free) Sugar, honey syrup  Sample Menu Breakfast                                Lunch                                     Supper Cranberry juice                    Beef broth                            Chicken broth Jell-O                                     Grape juice                           Apple juice Coffee or tea                        Jell-O                                      Popsicle                                                Coffee or tea                        Coffee or tea  _____________________________________________________________________    [facility] - Preparing for Surgery Before surgery, you can play an important role.  Because skin is not sterile,  your skin needs to be as free of germs as possible.  You can reduce the number of germs on your skin by washing with CHG (chlorahexidine gluconate) soap before surgery.  CHG is an antiseptic cleaner which kills germs and bonds with the skin to continue killing germs even after washing. Please DO NOT use if you have an allergy to CHG or antibacterial soaps.  If your  skin becomes reddened/irritated stop using the CHG and inform your nurse when you arrive at Short Stay. Do not shave (including legs and underarms) for at least 48 hours prior to the first CHG shower.  You may shave your face/neck. Please follow these instructions carefully:  1.  Shower with CHG Soap the night before surgery and the  morning of Surgery.  2.  If you choose to wash your hair, wash your hair first as usual with your  normal  shampoo.  3.  After you shampoo, rinse your hair and body thoroughly to remove the  shampoo.                           4.  Use CHG as you would any other liquid soap.  You can apply chg directly  to the skin and wash                       Gently with a scrungie or clean washcloth.  5.  Apply the CHG Soap to your body ONLY FROM THE NECK DOWN.   Do not use on face/ open                           Wound or open sores. Avoid contact with eyes, ears mouth and genitals (private parts).                       Wash face,  Genitals (private parts) with your normal soap.             6.  Wash thoroughly, paying special attention to the area where your surgery  will be performed.  7.  Thoroughly rinse your body with warm water from the neck down.  8.  DO NOT shower/wash with your normal soap after using and rinsing off  the CHG Soap.                9.  Pat yourself dry with a clean towel.            10.  Wear clean pajamas.            11.  Place clean sheets on your bed the night of your first shower and do not  sleep with pets. Day of Surgery : Do not apply any lotions/deodorants the morning of surgery.  Please wear clean clothes to the hospital/surgery center.  FAILURE TO FOLLOW THESE INSTRUCTIONS MAY RESULT IN THE CANCELLATION OF YOUR SURGERY PATIENT SIGNATURE_________________________________  NURSE SIGNATURE__________________________________  ________________________________________________________________________      Kathryn Strickland  An incentive  spirometer is a tool that can help keep your lungs clear and active. This tool measures how well you are filling your lungs with each breath. Taking long deep breaths may help reverse or decrease the chance of developing breathing (pulmonary) problems (especially infection) following:  A long period of time when you are unable to move or be active. BEFORE THE PROCEDURE   If the  spirometer includes an indicator to show your best effort, your nurse or respiratory therapist will set it to a desired goal.  If possible, sit up straight or lean slightly forward. Try not to slouch.  Hold the incentive spirometer in an upright position. INSTRUCTIONS FOR USE  1. Sit on the edge of your bed if possible, or sit up as far as you can in bed or on a chair. 2. Hold the incentive spirometer in an upright position. 3. Breathe out normally. 4. Place the mouthpiece in your mouth and seal your lips tightly around it. 5. Breathe in slowly and as deeply as possible, raising the piston or the ball toward the top of the column. 6. Hold your breath for 3-5 seconds or for as long as possible. Allow the piston or ball to fall to the bottom of the column. 7. Remove the mouthpiece from your mouth and breathe out normally. 8. Rest for a few seconds and repeat Steps 1 through 7 at least 10 times every 1-2 hours when you are awake. Take your time and take a few normal breaths between deep breaths. 9. The spirometer may include an indicator to show your best effort. Use the indicator as a goal to work toward during each repetition. 10. After each set of 10 deep breaths, practice coughing to be sure your lungs are clear. If you have an incision (the cut made at the time of surgery), support your incision when coughing by placing a pillow or rolled up towels firmly against it. Once you are able to get out of bed, walk around indoors and cough well. You may stop using the incentive spirometer when instructed by your caregiver.   RISKS AND COMPLICATIONS  Take your time so you do not get dizzy or light-headed.  If you are in pain, you may need to take or ask for pain medication before doing incentive spirometry. It is harder to take a deep breath if you are having pain. AFTER USE  Rest and breathe slowly and easily.  It can be helpful to keep track of a log of your progress. Your caregiver can provide you with a simple table to help with this. If you are using the spirometer at home, follow these instructions: [place] IF:   You are having difficultly using the spirometer.  You have trouble using the spirometer as often as instructed.  Your pain medication is not giving enough relief while using the spirometer.  You develop fever of 100.5 F (38.1 C) or higher. SEEK IMMEDIATE MEDICAL CARE IF:   You cough up bloody sputum that had not been present before.  You develop fever of 102 F (38.9 C) or greater.  You develop worsening pain at or near the incision site. MAKE SURE YOU:   Understand these instructions.  Will watch your condition.  Will get help right away if you are not doing well or get worse. Document Released: 01/28/2007 Document Revised: 12/10/2011 Document Reviewed: 03/31/2007 [facility] Patient Information 2014 [place], [place].   ________________________________________________________________________

## 2019-10-12 NOTE — Progress Notes (Signed)
Pacemaker orders request were sent via fax to Dr. Elberta Fortis.

## 2019-10-13 ENCOUNTER — Encounter (HOSPITAL_COMMUNITY)
Admission: RE | Admit: 2019-10-13 | Discharge: 2019-10-13 | Disposition: A | Discharge status: Home / Self Care | Payer: Medicare Other | Source: Ambulatory Visit | Attending: Orthopedic Surgery | Admitting: Orthopedic Surgery

## 2019-10-13 ENCOUNTER — Encounter (HOSPITAL_COMMUNITY): Payer: Self-pay

## 2019-10-13 ENCOUNTER — Other Ambulatory Visit: Payer: Self-pay

## 2019-10-13 DIAGNOSIS — Z01812 Encounter for preprocedural laboratory examination: Secondary | ICD-10-CM | POA: Insufficient documentation

## 2019-10-13 LAB — CBC
HCT: 43.1 % (ref 36.0–46.0)
Hemoglobin: 13.3 g/dL (ref 12.0–15.0)
MCH: 27.2 pg (ref 26.0–34.0)
MCHC: 30.9 g/dL (ref 30.0–36.0)
MCV: 88.1 fL (ref 80.0–100.0)
Platelets: 208 10*3/uL (ref 150–400)
RBC: 4.89 MIL/uL (ref 3.87–5.11)
RDW: 15.2 % (ref 11.5–15.5)
WBC: 5.7 10*3/uL (ref 4.0–10.5)
nRBC: 0 % (ref 0.0–0.2)

## 2019-10-13 LAB — URINALYSIS, ROUTINE W REFLEX MICROSCOPIC
Bilirubin Urine: NEGATIVE
Glucose, UA: NEGATIVE mg/dL
Hgb urine dipstick: NEGATIVE
Ketones, ur: NEGATIVE mg/dL
Leukocytes,Ua: NEGATIVE
Nitrite: NEGATIVE
Protein, ur: NEGATIVE mg/dL
Specific Gravity, Urine: 1.009 (ref 1.005–1.030)
pH: 5 (ref 5.0–8.0)

## 2019-10-13 LAB — BASIC METABOLIC PANEL
Anion gap: 9 (ref 5–15)
BUN: 27 mg/dL — ABNORMAL HIGH (ref 8–23)
CO2: 27 mmol/L (ref 22–32)
Calcium: 10.6 mg/dL — ABNORMAL HIGH (ref 8.9–10.3)
Chloride: 103 mmol/L (ref 98–111)
Creatinine, Ser: 0.92 mg/dL (ref 0.44–1.00)
GFR calc Af Amer: >60 mL/min (ref >60)
GFR calc non Af Amer: 57 mL/min — ABNORMAL LOW (ref >60)
Glucose, Bld: 98 mg/dL (ref 70–99)
Potassium: 4.2 mmol/L (ref 3.5–5.1)
Sodium: 139 mmol/L (ref 135–145)

## 2019-10-13 LAB — SURGICAL PCR SCREEN
MRSA, PCR: NEGATIVE
Staphylococcus aureus: POSITIVE — AB

## 2019-10-13 NOTE — Progress Notes (Addendum)
PCP - Christy Gentles. LOV: 12/17 Cardiologist - Doristine Johns.LOV: 8/31. Epic.  Clearance: Danielle Rankin NP  10/22. Epic.  Dr. Loman Brooklyn: pacemaker.LOV: 10/13. EPIC Chest x-ray - 7/20. EPIC EKG - 10/13. EPIC Stress Test -  ECHO -  Cardiac Cath -   Sleep Study -  CPAP -   Fasting Blood Sugar -  Checks Blood Sugar _____ times a day  Blood Thinner Instructions:Hold Eliquis three days before surgery as per DR. Hiltyl's instructions.Pt. said,she will hold it on 1/16. Aspirin Instructions:N/A Last Dose:  Anesthesia review: Pt. Has a pacemaker that was place on 03/21/19.Pt. and her son verbalized that she has been doing well since then without any complications.  Patient denies shortness of breath, fever, cough and chest pain at PAT appointment   Patient verbalized understanding of instructions that were given to them at the PAT appointment. Patient was also instructed that they will need to review over the PAT instructions again at home before surgery.

## 2019-10-14 NOTE — Telephone Encounter (Signed)
[facility] called and states they sent a form to the office via fax  On 10-12-19 that needs signed by the doctor and sent back before the patient can proceed with surgery. The Pre- surgery nurse said she is still waiting on the form to be sent back   Fax the form to [phone]. If the form was not received, please call Luis at [phone]. That is her direct number.

## 2019-10-14 NOTE — Progress Notes (Signed)
RN called Dr. Loman Brooklyn office to request the pacemaker orders.

## 2019-10-14 NOTE — Telephone Encounter (Signed)
Can we figure out what form this and who exactly needs to sign it (Dr. Rennis Golden, Dr. Elberta Fortis, or DOD)?  Thank you!

## 2019-10-15 NOTE — Telephone Encounter (Signed)
Dorris Fetch, CMA, called requesting surgeon's office this morning and had formed refaxed. It is a perioperative Rx for implanted cardiac device programming. Therefore, we have faxed form to [facility] so EP can address this.   Will remove from pre-op pool.

## 2019-10-16 ENCOUNTER — Other Ambulatory Visit (HOSPITAL_COMMUNITY)
Admission: RE | Admit: 2019-10-16 | Discharge: 2019-10-16 | Disposition: A | Discharge status: Home / Self Care | Payer: Medicare Other | Source: Ambulatory Visit | Attending: Orthopedic Surgery | Admitting: Orthopedic Surgery

## 2019-10-16 DIAGNOSIS — Z20822 Contact with and (suspected) exposure to covid-19: Secondary | ICD-10-CM | POA: Diagnosis not present

## 2019-10-16 DIAGNOSIS — Z01812 Encounter for preprocedural laboratory examination: Secondary | ICD-10-CM | POA: Diagnosis present

## 2019-10-16 NOTE — Progress Notes (Signed)
Anesthesia Chart Review   Case: [id number] Date/Time: 10/20/19 0715   Procedure: TOTAL HIP ARTHROPLASTY ANTERIOR APPROACH (Left Hip)   Anesthesia type: Choice   Pre-op diagnosis: OSTEOARTHRITIS LEFT HIP   Location: [facility] ROOM 08 / [facility]   Surgeons: Sheral Apley, MD      DISCUSSION:86 y.o. never smoker with h/o HTN, HLD, atrial fibrillation (on Eliquis), CHB with pacemaker, cardiomyopathy, CKD Stage III, left hip OA scheduled for above procedure 10/20/19 with Dr. Margarita Rana.   Pt cleared by cardiology 07/23/2019.  Per Georgie Chard, NP, "Chart reviewed as part of pre-operative protocol coverage. Given past medical history and time since last visit, based on ACC/AHA guidelines, Larisha Vencill would be at acceptable risk for the planned procedure without further cardiovascular testing.  Pt was cleared for surgery by Dr. Rennis Golden on 06/01/2019. She was also seen in follow up by Dr. Elberta Fortis on 07/14/19 and was doing well from a PPM/CHB perspective. No adjustments were made at that time.  Per pharmacy, patient with diagnosis ofafibon Eliquisfor anticoagulation with a CHADS2-VASc score of5(CHF, HTN, AGE, AGE, female) and CrCl38 ml/min Per office protocol, patient can holdEliquisfor 3days prior to procedure."  Anticipate pt can proceed with planned procedure barring acute status change.   VS: There were no vitals taken for this visit.  PROVIDERS: Philip Aspen, Limmie Patricia, MD is PCP   Zoila Shutter, [facility] is Cardiologist   Loman Brooklyn, MD is Electrophysiologist  LABS: Labs reviewed: Acceptable for surgery. (all labs ordered are listed, but only abnormal results are displayed)  Labs Reviewed - No data to display   IMAGES:   EKG: 07/14/2019 Rate 72 bpm Atrial-sensed ventricular paced rhythm   CV: Echo 05/05/2019 IMPRESSIONS    1. The left ventricle has normal systolic function with an ejection fraction of 60-65%. The cavity size was normal. Left ventricular  diastolic Doppler parameters are consistent with pseudonormalization.  2. The right ventricle has normal systolic function. The cavity was normal. There is no increase in right ventricular wall thickness.  3. Tricuspid valve regurgitation is moderate.  4. The aortic valve is abnormal. Mild thickening of the aortic valve. No stenosis of the aortic valve.  5. The aorta is normal in size and structure.  6. The aortic root and ascending aorta are normal in size and structure.  7. Grossly normal. Past Medical History:  Diagnosis Date  . Arthritis    Hips,Knees   . Atrial fibrillation (HCC) 04/07/2019  . Cardiomyopathy (HCC) 04/20/2019   EF 45% with apical WMA- no history of CAD or symptoms of angina  . Carotid bruit    LATERAL UNSPECIFIED  . Chronic pain   . Complete heart block (HCC) 03/2019  . CRI (chronic renal insufficiency), stage 3 (moderate) 04/20/2019  . Dementia (HCC)    Short term memory  . Genital disorder, female   . Hypercalcemia   . Hypercholesterolemia   . Hyperlipidemia   . Hypertension   . Osteoporosis   . Postmenopausal   . Presence of permanent cardiac pacemaker 03/25/2019   for complete heart block  . Primary osteoarthritis of right hip 05/18/2019  . Vitamin D deficiency     Past Surgical History:  Procedure Laterality Date  . COLONOSCOPY    . PACEMAKER IMPLANT N/A 03/25/2019   Procedure: PACEMAKER IMPLANT;  Surgeon: Regan Lemming, MD;  Location: [facility]AS[facility] CV LAB;  Service: Cardiovascular;  Laterality: N/A;  . TOTAL HIP ARTHROPLASTY Right 06/09/2019   Procedure: TOTAL HIP ARTHROPLASTY ANTERIOR APPROACH;  Surgeon: Renette Butters, MD;  Location: [facility];  Service: Orthopedics;  Laterality: Right;    MEDICATIONS: No current facility-administered medications for this encounter.   Marland Kitchen apixaban (ELIQUIS) 2.5 MG TABS tablet  . Cholecalciferol (VITAMIN D-3) 25 MCG (1000 UT) CAPS  . enalapril (VASOTEC) 10 MG tablet  . hydrochlorothiazide (HYDRODIURIL) 12.5 MG  tablet     Maia Plan [facility] Pre-Surgical Testing [phone] 10/16/19  3:25 PM

## 2019-10-17 LAB — NOVEL CORONAVIRUS, NAA (HOSP ORDER, SEND-OUT TO REF LAB; TAT 18-24 HRS): SARS-CoV-2, NAA: NOT DETECTED

## 2019-10-19 ENCOUNTER — Encounter (HOSPITAL_COMMUNITY): Payer: Medicare Other

## 2019-10-19 MED ORDER — BUPIVACAINE LIPOSOME 1.3 % IJ SUSP
10.0000 mL | Freq: Once | INTRAMUSCULAR | Status: DC
  Filled 2019-10-19: qty 10

## 2019-10-19 NOTE — Anesthesia Preprocedure Evaluation (Addendum)
Anesthesia Evaluation  Patient identified by MRN, date of birth, ID band Patient awake    Reviewed: Allergy & Precautions, NPO status , Patient's Chart, lab work & pertinent test results  Airway Mallampati: II  TM Distance: >3 FB Neck ROM: Full    Dental no notable dental hx. (+) Teeth Intact   Pulmonary neg pulmonary ROS,    Pulmonary exam normal breath sounds clear to auscultation       Cardiovascular hypertension, Pt. on medications Normal cardiovascular exam+ dysrhythmias Atrial Fibrillation + pacemaker  Rhythm:Regular Rate:Normal  05/05/19 Echo The left ventricle has normal systolic function with an ejection fraction of 60-65%. The cavity size was normal. Left ventricular diastolic Doppler parameters are consistent with pseudonormalization.  Pacemaker for 3rd degree heart block   Neuro/Psych Dementia negative neurological ROS     GI/Hepatic negative GI ROS, Neg liver ROS,   Endo/Other  negative endocrine ROS  Renal/GU Renal disease  negative genitourinary   Musculoskeletal   Abdominal   Peds negative pediatric ROS (+)  Hematology negative hematology ROS (+) Last eliquis dose 1/15 per pts son   Anesthesia Other Findings   Reproductive/Obstetrics                            Lab Results  Component Value Date   WBC 5.7 10/13/2019   HGB 13.3 10/13/2019   HCT 43.1 10/13/2019   MCV 88.1 10/13/2019   PLT 208 10/13/2019   Lab Results  Component Value Date   CREATININE 0.92 10/13/2019   BUN 27 (H) 10/13/2019   NA 139 10/13/2019   K 4.2 10/13/2019   CL 103 10/13/2019   CO2 27 10/13/2019     Anesthesia Physical  Anesthesia Plan  ASA: III  Anesthesia Plan: Spinal   Post-op Pain Management:    Induction:   PONV Risk Score and Plan: 2 and Treatment may vary due to age or medical condition, Ondansetron and Propofol infusion  Airway Management Planned: Nasal Cannula and Natural  Airway  Additional Equipment: None  Intra-op Plan:   Post-operative Plan:   Informed Consent: I have reviewed the patients History and Physical, chart, labs and discussed the procedure including the risks, benefits and alternatives for the proposed anesthesia with the patient or authorized representative who has indicated his/her understanding and acceptance.     Dental advisory given  Plan Discussed with: CRNA  Anesthesia Plan Comments:        Anesthesia Quick Evaluation

## 2019-10-20 ENCOUNTER — Encounter (HOSPITAL_COMMUNITY)
Admission: RE | Disposition: A | Discharge status: Home / Self Care | Payer: Self-pay | Source: Ambulatory Visit | Attending: Orthopedic Surgery

## 2019-10-20 ENCOUNTER — Inpatient Hospital Stay (HOSPITAL_COMMUNITY): Payer: Medicare Other

## 2019-10-20 ENCOUNTER — Observation Stay (HOSPITAL_COMMUNITY)
Admission: RE | Admit: 2019-10-20 | Discharge: 2019-10-21 | Disposition: A | Discharge status: Home / Self Care | Payer: Medicare Other | Source: Ambulatory Visit | Attending: Orthopedic Surgery | Admitting: Orthopedic Surgery

## 2019-10-20 ENCOUNTER — Inpatient Hospital Stay (HOSPITAL_COMMUNITY): Payer: Medicare Other | Admitting: Physician Assistant

## 2019-10-20 ENCOUNTER — Other Ambulatory Visit: Payer: Self-pay

## 2019-10-20 ENCOUNTER — Encounter (HOSPITAL_COMMUNITY): Payer: Self-pay | Admitting: Orthopedic Surgery

## 2019-10-20 DIAGNOSIS — I4891 Unspecified atrial fibrillation: Secondary | ICD-10-CM | POA: Diagnosis present

## 2019-10-20 DIAGNOSIS — F0390 Unspecified dementia without behavioral disturbance: Secondary | ICD-10-CM | POA: Diagnosis not present

## 2019-10-20 DIAGNOSIS — M160 Bilateral primary osteoarthritis of hip: Principal | ICD-10-CM | POA: Insufficient documentation

## 2019-10-20 DIAGNOSIS — E7800 Pure hypercholesterolemia, unspecified: Secondary | ICD-10-CM | POA: Insufficient documentation

## 2019-10-20 DIAGNOSIS — Z79899 Other long term (current) drug therapy: Secondary | ICD-10-CM | POA: Diagnosis not present

## 2019-10-20 DIAGNOSIS — M1610 Unilateral primary osteoarthritis, unspecified hip: Secondary | ICD-10-CM | POA: Diagnosis present

## 2019-10-20 DIAGNOSIS — N183 Chronic kidney disease, stage 3 unspecified: Secondary | ICD-10-CM | POA: Insufficient documentation

## 2019-10-20 DIAGNOSIS — Z7901 Long term (current) use of anticoagulants: Secondary | ICD-10-CM | POA: Insufficient documentation

## 2019-10-20 DIAGNOSIS — M25751 Osteophyte, right hip: Secondary | ICD-10-CM | POA: Diagnosis not present

## 2019-10-20 DIAGNOSIS — M1612 Unilateral primary osteoarthritis, left hip: Secondary | ICD-10-CM | POA: Diagnosis present

## 2019-10-20 DIAGNOSIS — M810 Age-related osteoporosis without current pathological fracture: Secondary | ICD-10-CM | POA: Diagnosis not present

## 2019-10-20 DIAGNOSIS — N2889 Other specified disorders of kidney and ureter: Secondary | ICD-10-CM | POA: Diagnosis present

## 2019-10-20 DIAGNOSIS — Z96641 Presence of right artificial hip joint: Secondary | ICD-10-CM | POA: Insufficient documentation

## 2019-10-20 DIAGNOSIS — I429 Cardiomyopathy, unspecified: Secondary | ICD-10-CM

## 2019-10-20 DIAGNOSIS — M8568 Other cyst of bone, other site: Secondary | ICD-10-CM | POA: Insufficient documentation

## 2019-10-20 DIAGNOSIS — I442 Atrioventricular block, complete: Secondary | ICD-10-CM | POA: Diagnosis not present

## 2019-10-20 DIAGNOSIS — M170 Bilateral primary osteoarthritis of knee: Secondary | ICD-10-CM | POA: Diagnosis not present

## 2019-10-20 DIAGNOSIS — Z950 Presence of cardiac pacemaker: Secondary | ICD-10-CM | POA: Diagnosis not present

## 2019-10-20 DIAGNOSIS — M25851 Other specified joint disorders, right hip: Secondary | ICD-10-CM | POA: Insufficient documentation

## 2019-10-20 DIAGNOSIS — E785 Hyperlipidemia, unspecified: Secondary | ICD-10-CM | POA: Diagnosis not present

## 2019-10-20 DIAGNOSIS — Z419 Encounter for procedure for purposes other than remedying health state, unspecified: Secondary | ICD-10-CM

## 2019-10-20 DIAGNOSIS — I10 Essential (primary) hypertension: Secondary | ICD-10-CM | POA: Diagnosis present

## 2019-10-20 HISTORY — PX: TOTAL HIP ARTHROPLASTY: SHX124

## 2019-10-20 SURGERY — ARTHROPLASTY, HIP, TOTAL, ANTERIOR APPROACH
Anesthesia: Spinal | Site: Hip | Laterality: Left

## 2019-10-20 MED ORDER — PROPOFOL 500 MG/50ML IV EMUL
INTRAVENOUS | Status: DC | PRN
  Administered 2019-10-20: 20 mg via INTRAVENOUS
  Administered 2019-10-20: 10 mg via INTRAVENOUS
  Administered 2019-10-20: 20 mg via INTRAVENOUS

## 2019-10-20 MED ORDER — HYDROCODONE-ACETAMINOPHEN 5-325 MG PO TABS
1.0000 | ORAL_TABLET | ORAL | Status: DC | PRN
  Administered 2019-10-20 – 2019-10-21 (×3): 1 via ORAL
  Filled 2019-10-20 (×3): qty 1

## 2019-10-20 MED ORDER — ACETAMINOPHEN 325 MG PO TABS: 325.0000 mg | ORAL_TABLET | Freq: Four times a day (QID) | ORAL | Status: DC | PRN

## 2019-10-20 MED ORDER — HYDROCODONE-ACETAMINOPHEN 5-325 MG PO TABS: 1.0000 | ORAL_TABLET | Freq: Four times a day (QID) | ORAL | 0 refills | Status: DC | PRN

## 2019-10-20 MED ORDER — TRANEXAMIC ACID-NACL 1000-0.7 MG/100ML-% IV SOLN
INTRAVENOUS | Status: AC
  Filled 2019-10-20: qty 100

## 2019-10-20 MED ORDER — BUPIVACAINE HCL (PF) 0.25 % IJ SOLN
INTRAMUSCULAR | Status: AC
  Filled 2019-10-20: qty 30

## 2019-10-20 MED ORDER — CEFAZOLIN SODIUM-DEXTROSE 2-4 GM/100ML-% IV SOLN
INTRAVENOUS | Status: AC
  Filled 2019-10-20: qty 100

## 2019-10-20 MED ORDER — BACLOFEN 10 MG PO TABS
5.0000 mg | ORAL_TABLET | Freq: Three times a day (TID) | ORAL | Status: DC
  Administered 2019-10-20 – 2019-10-21 (×2): 5 mg via ORAL
  Filled 2019-10-20 (×3): qty 1

## 2019-10-20 MED ORDER — DEXAMETHASONE SODIUM PHOSPHATE 10 MG/ML IJ SOLN
10.0000 mg | Freq: Once | INTRAMUSCULAR | Status: AC
  Administered 2019-10-21: 10 mg via INTRAVENOUS
  Filled 2019-10-20: qty 1

## 2019-10-20 MED ORDER — PROPOFOL 500 MG/50ML IV EMUL
INTRAVENOUS | Status: DC | PRN
  Administered 2019-10-20: 50 ug/kg/min via INTRAVENOUS

## 2019-10-20 MED ORDER — PHENYLEPHRINE HCL-NACL 10-0.9 MG/250ML-% IV SOLN
INTRAVENOUS | Status: DC | PRN
  Administered 2019-10-20: 25 ug/min via INTRAVENOUS

## 2019-10-20 MED ORDER — CEFAZOLIN SODIUM-DEXTROSE 2-4 GM/100ML-% IV SOLN
2.0000 g | INTRAVENOUS | Status: AC
  Administered 2019-10-20: 2 g via INTRAVENOUS

## 2019-10-20 MED ORDER — TRANEXAMIC ACID-NACL 1000-0.7 MG/100ML-% IV SOLN
1000.0000 mg | INTRAVENOUS | Status: AC
  Administered 2019-10-20: 1000 mg via INTRAVENOUS

## 2019-10-20 MED ORDER — WATER FOR IRRIGATION, STERILE IR SOLN
Status: DC | PRN
  Administered 2019-10-20: 2000 mL

## 2019-10-20 MED ORDER — PROPOFOL 10 MG/ML IV BOLUS
INTRAVENOUS | Status: AC
  Filled 2019-10-20: qty 20

## 2019-10-20 MED ORDER — SODIUM CHLORIDE FLUSH 0.9 % IV SOLN
INTRAVENOUS | Status: DC | PRN
  Administered 2019-10-20: 20 mL

## 2019-10-20 MED ORDER — BUPIVACAINE LIPOSOME 1.3 % IJ SUSP
INTRAMUSCULAR | Status: DC | PRN
  Administered 2019-10-20: 10 mL

## 2019-10-20 MED ORDER — LACTATED RINGERS IV SOLN: INTRAVENOUS | Status: DC

## 2019-10-20 MED ORDER — POVIDONE-IODINE 10 % EX SWAB
2.0000 "application " | Freq: Once | CUTANEOUS | Status: AC
  Administered 2019-10-20: 2 via TOPICAL

## 2019-10-20 MED ORDER — ACETAMINOPHEN 500 MG PO TABS
ORAL_TABLET | ORAL | Status: AC
  Administered 2019-10-20: 1000 mg via ORAL
  Filled 2019-10-20: qty 2

## 2019-10-20 MED ORDER — MENTHOL 3 MG MT LOZG: 1.0000 | LOZENGE | OROMUCOSAL | Status: DC | PRN

## 2019-10-20 MED ORDER — ONDANSETRON HCL 4 MG PO TABS: 4.0000 mg | ORAL_TABLET | Freq: Four times a day (QID) | ORAL | Status: DC | PRN

## 2019-10-20 MED ORDER — SORBITOL 70 % SOLN
30.0000 mL | Freq: Every day | Status: DC | PRN
  Filled 2019-10-20: qty 30

## 2019-10-20 MED ORDER — FENTANYL CITRATE (PF) 100 MCG/2ML IJ SOLN: 25.0000 ug | INTRAMUSCULAR | Status: DC | PRN

## 2019-10-20 MED ORDER — METOCLOPRAMIDE HCL 5 MG/ML IJ SOLN: 5.0000 mg | Freq: Three times a day (TID) | INTRAMUSCULAR | Status: DC | PRN

## 2019-10-20 MED ORDER — METOCLOPRAMIDE HCL 5 MG PO TABS: 5.0000 mg | ORAL_TABLET | Freq: Three times a day (TID) | ORAL | Status: DC | PRN

## 2019-10-20 MED ORDER — PHENOL 1.4 % MT LIQD: 1.0000 | OROMUCOSAL | Status: DC | PRN

## 2019-10-20 MED ORDER — PROPOFOL 500 MG/50ML IV EMUL
INTRAVENOUS | Status: AC
  Filled 2019-10-20: qty 50

## 2019-10-20 MED ORDER — CHLORHEXIDINE GLUCONATE 4 % EX LIQD: 60.0000 mL | Freq: Once | CUTANEOUS | Status: DC

## 2019-10-20 MED ORDER — FENTANYL CITRATE (PF) 100 MCG/2ML IJ SOLN
INTRAMUSCULAR | Status: DC | PRN
  Administered 2019-10-20: 25 ug via INTRAVENOUS

## 2019-10-20 MED ORDER — PHENYLEPHRINE HCL (PRESSORS) 10 MG/ML IV SOLN
INTRAVENOUS | Status: AC
  Filled 2019-10-20: qty 1

## 2019-10-20 MED ORDER — CEFAZOLIN SODIUM-DEXTROSE 1-4 GM/50ML-% IV SOLN
1.0000 g | Freq: Four times a day (QID) | INTRAVENOUS | Status: AC
  Administered 2019-10-20 (×2): 1 g via INTRAVENOUS
  Filled 2019-10-20 (×2): qty 50

## 2019-10-20 MED ORDER — APIXABAN 2.5 MG PO TABS
2.5000 mg | ORAL_TABLET | Freq: Two times a day (BID) | ORAL | Status: DC
  Administered 2019-10-21: 2.5 mg via ORAL
  Filled 2019-10-20: qty 1

## 2019-10-20 MED ORDER — HYDROCHLOROTHIAZIDE 12.5 MG PO CAPS
12.5000 mg | ORAL_CAPSULE | Freq: Every day | ORAL | Status: DC
  Administered 2019-10-21: 12.5 mg via ORAL
  Filled 2019-10-20: qty 1

## 2019-10-20 MED ORDER — FENTANYL CITRATE (PF) 100 MCG/2ML IJ SOLN
INTRAMUSCULAR | Status: AC
  Filled 2019-10-20: qty 2

## 2019-10-20 MED ORDER — MAGNESIUM CITRATE PO SOLN: 1.0000 | Freq: Once | ORAL | Status: DC | PRN

## 2019-10-20 MED ORDER — ONDANSETRON HCL 4 MG/2ML IJ SOLN
INTRAMUSCULAR | Status: DC | PRN
  Administered 2019-10-20: 4 mg via INTRAVENOUS

## 2019-10-20 MED ORDER — DIPHENHYDRAMINE HCL 12.5 MG/5ML PO ELIX
12.5000 mg | ORAL_SOLUTION | ORAL | Status: DC | PRN
  Administered 2019-10-20: 12.5 mg via ORAL
  Filled 2019-10-20: qty 5

## 2019-10-20 MED ORDER — ONDANSETRON HCL 4 MG/2ML IJ SOLN
INTRAMUSCULAR | Status: AC
  Filled 2019-10-20: qty 2

## 2019-10-20 MED ORDER — ONDANSETRON HCL 4 MG/2ML IJ SOLN: 4.0000 mg | Freq: Four times a day (QID) | INTRAMUSCULAR | Status: DC | PRN

## 2019-10-20 MED ORDER — BUPIVACAINE LIPOSOME 1.3 % IJ SUSP
10.0000 mL | Freq: Once | INTRAMUSCULAR | Status: DC
  Filled 2019-10-20: qty 10

## 2019-10-20 MED ORDER — 0.9 % SODIUM CHLORIDE (POUR BTL) OPTIME
TOPICAL | Status: DC | PRN
  Administered 2019-10-20: 1000 mL

## 2019-10-20 MED ORDER — MORPHINE SULFATE (PF) 2 MG/ML IV SOLN: 0.5000 mg | INTRAVENOUS | Status: DC | PRN

## 2019-10-20 MED ORDER — SODIUM CHLORIDE (PF) 0.9 % IJ SOLN
INTRAMUSCULAR | Status: AC
  Filled 2019-10-20: qty 50

## 2019-10-20 MED ORDER — ACETAMINOPHEN 500 MG PO TABS: 1000.0000 mg | ORAL_TABLET | Freq: Once | ORAL | Status: AC

## 2019-10-20 MED ORDER — POLYETHYLENE GLYCOL 3350 17 G PO PACK: 17.0000 g | PACK | Freq: Every day | ORAL | Status: DC | PRN

## 2019-10-20 MED ORDER — ONDANSETRON HCL 4 MG PO TABS: 4.0000 mg | ORAL_TABLET | Freq: Three times a day (TID) | ORAL | 0 refills | Status: DC | PRN

## 2019-10-20 MED ORDER — VITAMIN D 25 MCG (1000 UNIT) PO TABS
1000.0000 [IU] | ORAL_TABLET | Freq: Every day | ORAL | Status: DC
  Administered 2019-10-21: 1000 [IU] via ORAL
  Filled 2019-10-20: qty 1

## 2019-10-20 MED ORDER — BACLOFEN 10 MG PO TABS: 10.0000 mg | ORAL_TABLET | Freq: Two times a day (BID) | ORAL | 0 refills | Status: DC | PRN

## 2019-10-20 MED ORDER — BUPIVACAINE IN DEXTROSE 0.75-8.25 % IT SOLN
INTRATHECAL | Status: DC | PRN
  Administered 2019-10-20: 1.6 mL via INTRATHECAL

## 2019-10-20 MED ORDER — ACETAMINOPHEN 500 MG PO TABS
500.0000 mg | ORAL_TABLET | Freq: Four times a day (QID) | ORAL | Status: AC
  Administered 2019-10-20 – 2019-10-21 (×4): 500 mg via ORAL
  Filled 2019-10-20 (×4): qty 1

## 2019-10-20 MED ORDER — DOCUSATE SODIUM 100 MG PO CAPS
100.0000 mg | ORAL_CAPSULE | Freq: Two times a day (BID) | ORAL | Status: DC
  Administered 2019-10-20 – 2019-10-21 (×2): 100 mg via ORAL
  Filled 2019-10-20 (×2): qty 1

## 2019-10-20 SURGICAL SUPPLY — 43 items
BAG ZIPLOCK 12X15 (MISCELLANEOUS) IMPLANT
BLADE SAG 18X100X1.27 (BLADE) ×3 IMPLANT
BLADE SURG SZ10 CARB STEEL (BLADE) ×6 IMPLANT
CHLORAPREP W/TINT 26 (MISCELLANEOUS) ×3 IMPLANT
CLOSURE STERI-STRIP 1/2X4 (GAUZE/BANDAGES/DRESSINGS) ×1
CLSR STERI-STRIP ANTIMIC 1/2X4 (GAUZE/BANDAGES/DRESSINGS) ×2 IMPLANT
COVER PERINEAL POST (MISCELLANEOUS) ×3 IMPLANT
COVER SURGICAL LIGHT HANDLE (MISCELLANEOUS) ×3 IMPLANT
COVER WAND RF STERILE (DRAPES) IMPLANT
DECANTER SPIKE VIAL GLASS SM (MISCELLANEOUS) ×4 IMPLANT
DRAPE IMP U-DRAPE 54X76 (DRAPES) ×3 IMPLANT
DRAPE STERI IOBAN 125X83 (DRAPES) ×3 IMPLANT
DRAPE U-SHAPE 47X51 STRL (DRAPES) ×6 IMPLANT
DRSG MEPILEX BORDER 4X8 (GAUZE/BANDAGES/DRESSINGS) ×3 IMPLANT
ELECT BLADE TIP CTD 4 INCH (ELECTRODE) ×3 IMPLANT
ELECT REM PT RETURN 15FT ADLT (MISCELLANEOUS) ×3 IMPLANT
GLOVE BIO SURGEON STRL SZ7.5 (GLOVE) ×6 IMPLANT
GLOVE BIOGEL PI IND STRL 8 (GLOVE) ×2 IMPLANT
GLOVE BIOGEL PI INDICATOR 8 (GLOVE) ×4
GOWN STRL REUS W/TWL LRG LVL3 (GOWN DISPOSABLE) ×3 IMPLANT
GOWN STRL REUS W/TWL XL LVL3 (GOWN DISPOSABLE) ×3 IMPLANT
HEAD CERAMIC FEMORAL 36MM (Head) ×2 IMPLANT
HOLDER FOLEY CATH W/STRAP (MISCELLANEOUS) IMPLANT
INSERT TRIDENT POLY 36MM 0DEG (Insert) ×2 IMPLANT
KIT TURNOVER KIT A (KITS) IMPLANT
MANIFOLD NEPTUNE II (INSTRUMENTS) ×3 IMPLANT
NS IRRIG 1000ML POUR BTL (IV SOLUTION) ×3 IMPLANT
PACK ANTERIOR HIP CUSTOM (KITS) ×3 IMPLANT
PENCIL SMOKE EVACUATOR (MISCELLANEOUS) IMPLANT
PROTECTOR NERVE ULNAR (MISCELLANEOUS) ×3 IMPLANT
SCREW HEX LP 6.5X20 (Screw) ×4 IMPLANT
SHELL CLUSTERHOLE ACETABULAR 5 (Shell) ×2 IMPLANT
STEM ACCOLADE II SZ8 (Stem) ×2 IMPLANT
SUT MNCRL AB 4-0 PS2 18 (SUTURE) ×3 IMPLANT
SUT STRATAFIX 0 PDS 27 VIOLET (SUTURE) ×3
SUT VIC AB 0 CT1 36 (SUTURE) ×3 IMPLANT
SUT VIC AB 1 CT1 36 (SUTURE) ×3 IMPLANT
SUT VIC AB 2-0 CT1 27 (SUTURE) ×4
SUT VIC AB 2-0 CT1 TAPERPNT 27 (SUTURE) ×2 IMPLANT
SUTURE STRATFX 0 PDS 27 VIOLET (SUTURE) ×1 IMPLANT
TRAY FOLEY MTR SLVR 14FR STAT (SET/KITS/TRAYS/PACK) ×2 IMPLANT
WATER STERILE IRR 1000ML POUR (IV SOLUTION) ×6 IMPLANT
YANKAUER SUCT BULB TIP 10FT TU (MISCELLANEOUS) ×3 IMPLANT

## 2019-10-20 NOTE — Discharge Instructions (Signed)

## 2019-10-20 NOTE — Transfer of Care (Signed)
Immediate Anesthesia Transfer of Care Note  Patient: Kathryn Strickland  Procedure(s) Performed: TOTAL HIP ARTHROPLASTY ANTERIOR APPROACH (Left Hip)  Patient Location: PACU  Anesthesia Type:Spinal  Level of Consciousness: drowsy, patient cooperative and responds to stimulation  Airway & Oxygen Therapy: Patient Spontanous Breathing and Patient connected to face mask oxygen  Post-op Assessment: Report given to RN and Post -op Vital signs reviewed and stable  Post vital signs: Reviewed and stable  Last Vitals:  Vitals Value Taken Time  BP 123/60 10/20/19 0923  Temp    Pulse 59 10/20/19 0925  Resp 13 10/20/19 0925  SpO2 100 % 10/20/19 0925  Vitals shown include unvalidated device data.  Last Pain:  Vitals:   10/20/19 0542  TempSrc: Oral         Complications: No apparent anesthesia complications

## 2019-10-20 NOTE — Plan of Care (Signed)

## 2019-10-20 NOTE — Anesthesia Procedure Notes (Signed)
Spinal  Patient location during procedure: OR Start time: 10/20/2019 7:26 AM End time: 10/20/2019 7:30 AM Staffing Performed: resident/CRNA  Anesthesiologist: Fitzgerald, Robert, MD Resident/CRNA: ,  G, CRNA Preanesthetic Checklist Completed: patient identified, IV checked, site marked, risks and benefits discussed, surgical consent, monitors and equipment checked, pre-op evaluation and timeout performed Spinal Block Patient position: sitting Prep: DuraPrep Patient monitoring: heart rate, cardiac monitor, continuous pulse ox and blood pressure Approach: midline Location: L3-4 Injection technique: single-shot Needle Needle type: Pencan  Needle gauge: 24 G Needle length: 9 cm Assessment Sensory level: T6 Additional Notes Kit expiration date checked and verified.  Sterile prep and drape, skin local with 1% lidocaine, stick x 1, - heme, - paraesthesia, + CSF pre and post injection, patient tolerated procedure well.     

## 2019-10-20 NOTE — Anesthesia Procedure Notes (Signed)
Date/Time: 10/20/2019 7:23 AM Performed by: Thornell Mule, CRNA Oxygen Delivery Method: Simple face mask

## 2019-10-20 NOTE — Anesthesia Postprocedure Evaluation (Signed)
Anesthesia Post Note  Patient: Kathryn Strickland  Procedure(s) Performed: TOTAL HIP ARTHROPLASTY ANTERIOR APPROACH (Left Hip)     Patient location during evaluation: PACU Anesthesia Type: Spinal Level of consciousness: awake and alert Pain management: pain level controlled Vital Signs Assessment: post-procedure vital signs reviewed and stable Respiratory status: spontaneous breathing, nonlabored ventilation, respiratory function stable and patient connected to nasal cannula oxygen Cardiovascular status: stable and blood pressure returned to baseline Postop Assessment: no apparent nausea or vomiting and spinal receding Anesthetic complications: no    Last Vitals:  Vitals:   10/20/19 1241 10/20/19 1401  BP: (!) 145/70 (!) 153/72  Pulse: 71 78  Resp: 16 16  Temp: (!) 36.3 C (!) 36.3 C  SpO2: 100% 100%    Last Pain:  Vitals:   10/20/19 1401  TempSrc: Oral  PainSc:                  [name]

## 2019-10-20 NOTE — Evaluation (Signed)
Physical Therapy Evaluation Patient Details Name: Kathryn Strickland MRN: [id number] DOB: November 11, 1932 Today's Date: 10/20/2019   History of Present Illness  Pt is 86 yo female s/p L anterior THA on 10/20/19.  Pt with PMH of pacemaker and R THA (see H and P for full PMH)  Clinical Impression  Pt is s/p L anterior THA on DOS resulting in the deficits listed below (see PT Problem List). Pt was able to transfer with min A and ambulated 250' with RW.  Required cues for transfer techniques and safety.  Pt will have 24 hr support from family at d/c.  Pt will benefit from skilled PT to increase their independence and safety with mobility to allow discharge to the venue listed below.      Follow Up Recommendations Follow surgeon's recommendation for DC plan and follow-up therapies    Equipment Recommendations  None recommended by PT;Other (comment)(has DME)    Recommendations for Other Services       Precautions / Restrictions Precautions Precautions: Fall Restrictions Weight Bearing Restrictions: Yes LLE Weight Bearing: Weight bearing as tolerated      Mobility  Bed Mobility Overal bed mobility: Needs Assistance Bed Mobility: Supine to Sit     Supine to sit: Min assist     General bed mobility comments: increased time; cues for sequence  Transfers Overall transfer level: Needs assistance Equipment used: Rolling walker (2 wheeled) Transfers: Sit to/from Stand Sit to Stand: Min guard         General transfer comment: cues for safe hand placement  Ambulation/Gait Ambulation/Gait assistance: Min guard Gait Distance (Feet): 250 Feet Assistive device: Rolling walker (2 wheeled) Gait Pattern/deviations: Decreased stride length     General Gait Details: started with step to pattern and progressed to step through with cues  Stairs            Wheelchair Mobility    Modified Rankin (Stroke Patients Only)       Balance Overall balance assessment: Needs  assistance Sitting-balance support: Bilateral upper extremity supported;Feet supported Sitting balance-Leahy Scale: Good     Standing balance support: Bilateral upper extremity supported;During functional activity Standing balance-Leahy Scale: Fair                               Pertinent Vitals/Pain Pain Assessment: No/denies pain    Home Living Family/patient expects to be discharged to:: Private residence Living Arrangements: Children Available Help at Discharge: Family;Available 24 hours/day Type of Home: House Home Access: Stairs to enter Entrance Stairs-Rails: Left Entrance Stairs-Number of Steps: 3 Home Layout: One level Home Equipment: Walker - 2 wheels;Walker - standard;Shower seat      Prior Function Level of Independence: Independent with assistive device(s)               Hand Dominance        Extremity/Trunk Assessment   Upper Extremity Assessment Upper Extremity Assessment: Overall WFL for tasks assessed    Lower Extremity Assessment Lower Extremity Assessment: LLE deficits/detail;RLE deficits/detail RLE Deficits / Details: ROM WFL; Strength 5/5 LLE Deficits / Details: ROM WFL; MMT Strength ankle 5/5, knee 3/5, hip 2/5    Cervical / Trunk Assessment Cervical / Trunk Assessment: Normal  Communication   Communication: HOH  Cognition Arousal/Alertness: Awake/alert Behavior During Therapy: WFL for tasks assessed/performed Overall Cognitive Status: Within Functional Limits for tasks assessed  General Comments General comments (skin integrity, edema, etc.): vss    Exercises General Exercises - Lower Extremity Ankle Circles/Pumps: Seated;Both;10 reps;AROM Quad Sets: AROM;Seated;Both;10 reps Heel Slides: AAROM;Left;10 reps;Supine Hip ABduction/ADduction: AAROM;Supine;Left;10 reps   Assessment/Plan    PT Assessment Patient needs continued PT services  PT Problem List Decreased  strength;Decreased mobility;Decreased safety awareness;Decreased range of motion;Decreased activity tolerance;Decreased balance;Decreased knowledge of use of DME       PT Treatment Interventions DME instruction;Therapeutic activities;Gait training;Therapeutic exercise;Patient/family education;Stair training;Balance training;Functional mobility training    PT Goals (Current goals can be found in the Care Plan section)  Acute Rehab PT Goals Patient Stated Goal: return to son's home tomorrow PT Goal Formulation: With patient/family Time For Goal Achievement: 11/03/19 Potential to Achieve Goals: Good    Frequency 7X/week   Barriers to discharge        Co-evaluation               AM-PAC PT "6 Clicks" Mobility  Outcome Measure Help needed turning from your back to your side while in a flat bed without using bedrails?: A Little Help needed moving from lying on your back to sitting on the side of a flat bed without using bedrails?: A Little Help needed moving to and from a bed to a chair (including a wheelchair)?: None Help needed standing up from a chair using your arms (e.g., wheelchair or bedside chair)?: None Help needed to walk in hospital room?: None Help needed climbing 3-5 steps with a railing? : A Little 6 Click Score: 21    End of Session Equipment Utilized During Treatment: Gait belt Activity Tolerance: Patient tolerated treatment well Patient left: with chair alarm set;in chair;with family/visitor present;with call bell/phone within reach Nurse Communication: Mobility status PT Visit Diagnosis: Other abnormalities of gait and mobility (R26.89);Muscle weakness (generalized) (M62.81)    Time: 1027-2536 PT Time Calculation (min) (ACUTE ONLY): 30 min   Charges:   PT Evaluation $PT Eval Low Complexity: 1 Low PT Treatments $Therapeutic Exercise: 8-22 mins        Royetta Asal, PT Acute Rehab Services Pager [phone] [facility] [phone] [facility] [phone]   Rayetta Humphrey 10/20/2019, 4:24 PM

## 2019-10-20 NOTE — Op Note (Signed)
10/20/2019  7:33 AM  PATIENT:  Kathryn Strickland   MRN: [id number]  PRE-OPERATIVE DIAGNOSIS:  OSTEOARTHRITIS LEFT HIP  POST-OPERATIVE DIAGNOSIS:  * No post-op diagnosis entered *  PROCEDURE:  Procedure(s): TOTAL HIP ARTHROPLASTY ANTERIOR APPROACH  PREOPERATIVE INDICATIONS:    Kathryn Strickland is an 86 y.o. female who has a diagnosis of Primary osteoarthritis of left hip and elected for surgical management after failing conservative treatment.  The risks benefits and alternatives were discussed with the patient including but not limited to the risks of nonoperative treatment, versus surgical intervention including infection, bleeding, nerve injury, periprosthetic fracture, the need for revision surgery, dislocation, leg length discrepancy, blood clots, cardiopulmonary complications, morbidity, mortality, among others, and they were willing to proceed.     OPERATIVE REPORT     SURGEON:   Renette Butters, MD    ASSISTANT:  Roxan Hockey, PA-C, he was present and scrubbed throughout the case, critical for completion in a timely fashion, and for retraction, instrumentation, and closure.     ANESTHESIA:  General    COMPLICATIONS:  None.     COMPONENTS:  Stryker acolade fit femur size 8 with a 36 mm -0 head ball and an acetabular shell size 52 with a  polyethylene liner    PROCEDURE IN DETAIL:   The patient was met in the holding area and  identified.  The appropriate hip was identified and marked at the operative site.  The patient was then transported to the OR  and  placed under anesthesia per that record.  At that point, the patient was  placed in the supine position and  secured to the operating room table and all bony prominences padded. He received pre-operative antibiotics    The operative lower extremity was prepped from the iliac crest to the distal leg.  Sterile draping was performed.  Time out was performed prior to incision.      Skin incision was made just 2  cm lateral to the ASIS  extending in line with the tensor fascia lata. Electrocautery was used to control all bleeders. I dissected down sharply to the fascia of the tensor fascia lata was confirmed that the muscle fibers beneath were running posteriorly. I then incised the fascia over the superficial tensor fascia lata in line with the incision. The fascia was elevated off the anterior aspect of the muscle the muscle was retracted posteriorly and protected throughout the case. I then used electrocautery to incise the tensor fascia lata fascia control and all bleeders. Immediately visible was the fat over top of the anterior neck and capsule.  I removed the anterior fat from the capsule and elevated the rectus muscle off of the anterior capsule. I then removed a large time of capsule. The retractors were then placed over the anterior acetabulum as well as around the superior and inferior neck.  I then made a femoral neck cut. Then used the power corkscrew to remove the femoral head from the acetabulum and thoroughly irrigated the acetabulum. I sized the femoral head.    I then exposed the deep acetabulum, cleared out any tissue including the ligamentum teres.   After adequate visualization, I excised the labrum, and then sequentially reamed.  I then impacted the acetabular implant into place using fluoroscopy for guidance.  Appropriate version and inclination was confirmed clinically matching their bony anatomy, and with fluoroscopy.  I placed a 20 mm screw in the posterior/superio position with an excellent bite.    I then placed  the polyethylene liner in place  I then adducted the leg and released the external rotators from the posterior femur allowing it to be easily delivered up lateral and anterior to the acetabulum for preparation of the femoral canal.    I then prepared the proximal femur using the cookie-cutter and then sequentially reamed and broached.  A trial broach, neck, and head was  utilized, and I reduced the hip and used floroscopy to assess the neck length and femoral implant.  I then impacted the femoral prosthesis into place into the appropriate version. The hip was then reduced and fluoroscopy confirmed appropriate position. Leg lengths were restored.  I then irrigated the hip copiously again with, and repaired the fascia with Vicryl, followed by monocryl for the subcutaneous tissue, Monocryl for the skin, Steri-Strips and sterile gauze. The patient was then awakened and returned to PACU in stable and satisfactory condition. There were no complications.  POST OPERATIVE PLAN: WBAT, DVT px: SCD's/TED, ambulation and chemical dvt px  Edmonia Lynch, MD Orthopedic Surgeon [phone]

## 2019-10-20 NOTE — Interval H&P Note (Signed)
I participated in the care of this patient and agree with the above history, physical and evaluation. I performed a review of the history and a physical exam as detailed   Evellyn Tuff Daniel Maxie Slovacek MD  

## 2019-10-21 ENCOUNTER — Encounter: Payer: Self-pay | Admitting: *Deleted

## 2019-10-21 DIAGNOSIS — M160 Bilateral primary osteoarthritis of hip: Secondary | ICD-10-CM | POA: Diagnosis not present

## 2019-10-21 NOTE — Progress Notes (Signed)
Physical Therapy Treatment Patient Details Name: Kathryn Strickland MRN: [id number] DOB: 18-Mar-1933 Today's Date: 10/21/2019    History of Present Illness Pt is 86 yo female s/p L anterior THA on 10/20/19.  Pt with PMH of pacemaker and R THA (see H and P for full PMH)    PT Comments    Pt progressing well with PT. Will see for a second session and should be ready for d/c at this time.  Follow Up Recommendations  Follow surgeon's recommendation for DC plan and follow-up therapies     Equipment Recommendations  None recommended by PT    Recommendations for Other Services       Precautions / Restrictions Precautions Precautions: Fall Restrictions Weight Bearing Restrictions: No LLE Weight Bearing: Weight bearing as tolerated    Mobility  Bed Mobility                  Transfers Overall transfer level: Needs assistance Equipment used: Rolling walker (2 wheeled) Transfers: Sit to/from Stand Sit to Stand: Min guard         General transfer comment: cues for safe hand placement  Ambulation/Gait Ambulation/Gait assistance: Min guard Gait Distance (Feet): 160 Feet Assistive device: Rolling walker (2 wheeled) Gait Pattern/deviations: Step-through pattern;Decreased stride length     General Gait Details: cues for sequence initially and progression to step through as pain allowed    Stairs Stairs: Yes Stairs assistance: Min assist;Min guard Stair Management: Step to pattern;Forwards;With cane;One rail Right Number of Stairs: 3 General stair comments: cues for sequence and technique, step to pattern   Wheelchair Mobility    Modified Rankin (Stroke Patients Only)       Balance                                            Cognition Arousal/Alertness: Awake/alert Behavior During Therapy: WFL for tasks assessed/performed Overall Cognitive Status: Within Functional Limits for tasks assessed                                         Exercises      General Comments        Pertinent Vitals/Pain Pain Assessment: 0-10 Pain Score: 4  Pain Location: L hip Pain Descriptors / Indicators: Guarding;Grimacing;Sore Pain Intervention(s): Limited activity within patient's tolerance;Monitored during session;Ice applied;Premedicated before session    Home Living                      Prior Function            PT Goals (current goals can now be found in the care plan section) Acute Rehab PT Goals Patient Stated Goal: return to son's home tomorrow PT Goal Formulation: With patient/family Time For Goal Achievement: 11/03/19 Potential to Achieve Goals: Good Progress towards PT goals: Progressing toward goals    Frequency    7X/week      PT Plan Current plan remains appropriate    Co-evaluation              AM-PAC PT "6 Clicks" Mobility   Outcome Measure  Help needed turning from your back to your side while in a flat bed without using bedrails?: A Little Help needed moving from lying on your back to sitting on the  side of a flat bed without using bedrails?: A Little Help needed moving to and from a bed to a chair (including a wheelchair)?: A Little Help needed standing up from a chair using your arms (e.g., wheelchair or bedside chair)?: A Little Help needed to walk in hospital room?: A Little Help needed climbing 3-5 steps with a railing? : A Little 6 Click Score: 18    End of Session Equipment Utilized During Treatment: Gait belt Activity Tolerance: Patient tolerated treatment well Patient left: in chair;with call bell/phone within reach;with chair alarm set Nurse Communication: Mobility status PT Visit Diagnosis: Other abnormalities of gait and mobility (R26.89);Muscle weakness (generalized) (M62.81)     Time: 9563-8756 PT Time Calculation (min) (ACUTE ONLY): 23 min  Charges:  $Gait Training: 23-37 mins                     Elgin Carn, PT   Acute Rehab Dept [facility]):  [phone]   10/21/2019    [facility] 10/21/2019, 11:32 AM

## 2019-10-21 NOTE — Care Management CC44 (Signed)
Condition Code 44 Documentation Completed  Patient Details  Name: Kathryn Strickland MRN: [id number] Date of Birth: [birth year]   Condition Code 44 given:  Yes Patient signature on Condition Code 44 notice:  Yes Documentation of 2 MD's agreement:  Yes Code 44 added to claim:  Yes    Clearance Coots, LCSW 10/21/2019, 10:13 AM

## 2019-10-21 NOTE — Progress Notes (Signed)
    Subjective: Patient reports pain as mild, controlled.  Tolerating diet.  Urinating.  No CP, SOB.  Excellent early mobilization w/ therapy.  Objective:   VITALS:   Vitals:   10/20/19 1609 10/20/19 2201 10/21/19 0150 10/21/19 0509  BP: (!) 149/77 (!) 143/63 (!) 120/56 (!) 128/48  Pulse: 86 88 93 89  Resp: 17 18 18 18   Temp: (!) 97.5 F (36.4 C) 99.1 F (37.3 C) (!) 97.5 F (36.4 C) 99.1 F (37.3 C)  TempSrc: Oral Oral Oral Oral  SpO2: 100% 100% 96% 98%  Weight:      Height:       CBC Latest Ref Rng & Units 10/13/2019 06/02/2019 03/24/2019  WBC 4.0 - 10.5 K/uL 5.7 6.7 6.9  Hemoglobin 12.0 - 15.0 g/dL 03/26/2019 01.0 27.2  Hematocrit 36.0 - 46.0 % 43.1 42.3 39.2  Platelets 150 - 400 K/uL 208 216 226   BMP Latest Ref Rng & Units 10/13/2019 06/02/2019 03/25/2019  Glucose 70 - 99 mg/dL 98 03/27/2019) 644(I)  BUN 8 - 23 mg/dL 347(Q) 25(Z) 20  Creatinine 0.44 - 1.00 mg/dL 56(L 8.75 6.43)  Sodium 135 - 145 mmol/L 139 138 139  Potassium 3.5 - 5.1 mmol/L 4.2 4.2 3.9  Chloride 98 - 111 mmol/L 103 101 107  CO2 22 - 32 mmol/L 27 26 22   Calcium 8.9 - 10.3 mg/dL 10.6(H) 10.5(H) 10.0   Intake/Output      01/19 0701 - 01/20 0700 01/20 0701 - 01/21 0700   I.V. (mL/kg) 3103.2 (58)    IV Piggyback 250    Total Intake(mL/kg) 3353.2 (62.7)    Urine (mL/kg/hr) 1875 (1.5)    Blood 200    Total Output 2075    Net +1278.2            Physical Exam: General: NAD.  Supine in bed.  Calm, conversant. Resp: No increased wob Cardio: regular rate ABD soft Neurologically intact MSK LLE: Neurovascularly intact Sensation intact distally Feet warm Dorsiflexion/Plantar flexion intact Incision: dressing with small one dime sized area of bloody drainage.   Assessment: 1 Day Post-Op  S/P Procedure(s) (LRB): TOTAL HIP ARTHROPLASTY ANTERIOR APPROACH (Left) by Dr. 2/21. 2076 on 10/19/2018   Principal Problem:   Primary osteoarthritis of left hip Active Problems:   Hypertension   Complete heart  block (HCC)   Cardiomyopathy (HCC)   CRI (chronic renal insufficiency), stage 3 (moderate)   Atrial fibrillation (HCC)   Primary osteoarthritis of hip   Primary osteoarthritis, status post total hip arthroplasty Doing well postop day 1 Tolerating diet and voiding Pain controlled Mobilized very well yesterday with therapy  Plan: Up with therapy Incentive Spirometry Apply ice   Weight Bearing: Weight Bearing as Tolerated (WBAT) LLE Dressings: Maintain Mepilex.   VTE prophylaxis: Resume Eliquis, SCDs, ambulation Dispo: Home today   Eulah Pont [place] III, PA-C 10/21/2019, 7:13 AM

## 2019-10-21 NOTE — Discharge Summary (Signed)
Discharge Summary  Patient ID: Kathryn Strickland MRN: [id number] DOB/AGE: 1933/04/18 86 y.o.  Admit date: 10/20/2019 Discharge date: 10/21/2019  Admission Diagnoses:  Primary osteoarthritis of left hip  Discharge Diagnoses:  Principal Problem:   Primary osteoarthritis of left hip Active Problems:   Hypertension   Complete heart block (HCC)   Cardiomyopathy (HCC)   CRI (chronic renal insufficiency), stage 3 (moderate)   Atrial fibrillation (HCC)   Primary osteoarthritis of hip   Past Medical History:  Diagnosis Date  . Arthritis    Hips,Knees   . Atrial fibrillation ([place]) 04/07/2019  . Cardiomyopathy ([place]) 04/20/2019   EF 45% with apical WMA- no history of CAD or symptoms of angina  . Carotid bruit    LATERAL UNSPECIFIED  . Chronic pain   . Complete heart block ([place]) 03/2019  . CRI (chronic renal insufficiency), stage 3 (moderate) 04/20/2019  . Dementia ([place])    Short term memory  . Genital disorder, female   . Hypercalcemia   . Hypercholesterolemia   . Hyperlipidemia   . Hypertension   . Osteoporosis   . Postmenopausal   . Presence of permanent cardiac pacemaker 03/25/2019   for complete heart block  . Primary osteoarthritis of right hip 05/18/2019  . Vitamin D deficiency     Surgeries: Procedure(s): TOTAL HIP ARTHROPLASTY ANTERIOR APPROACH on 10/20/2019   Consultants (if any):   Discharged Condition: Improved  Hospital Course: Chrysten Woulfe is an 86 y.o. female who was admitted 10/20/2019 with a diagnosis of Primary osteoarthritis of left hip and went to the operating room on 10/20/2019 and underwent the above named procedures.    She was given perioperative antibiotics:  Anti-infectives (From admission, onward)   Start     Dose/Rate Route Frequency Ordered Stop   10/20/19 1330  ceFAZolin (ANCEF) IVPB 1 g/50 mL premix     1 g 100 mL/hr over 30 Minutes Intravenous Every 6 hours 10/20/19 1236 10/20/19 2103   10/20/19 0600  ceFAZolin (ANCEF) IVPB  2g/100 mL premix     2 g 200 mL/hr over 30 Minutes Intravenous On call to O.R. 10/20/19 0533 10/20/19 0801   10/20/19 0537  ceFAZolin (ANCEF) 2-4 GM/100ML-% IVPB    Note to Pharmacy: Charmayne Sheer   : cabinet override      10/20/19 0537 10/20/19 0734    .  She was given sequential compression devices, early ambulation, and Eliquis was resumed for DVT prophylaxis.  She benefited maximally from the hospital stay and there were no complications.    Recent vital signs:  Vitals:   10/21/19 0150 10/21/19 0509  BP: (!) 120/56 (!) 128/48  Pulse: 93 89  Resp: 18 18  Temp: (!) 97.5 F (36.4 C) 99.1 F (37.3 C)  SpO2: 96% 98%    Recent laboratory studies:  Lab Results  Component Value Date   HGB 13.3 10/13/2019   HGB 13.0 06/02/2019   HGB 12.1 03/24/2019   Lab Results  Component Value Date   WBC 5.7 10/13/2019   PLT 208 10/13/2019   No results found for: INR Lab Results  Component Value Date   NA 139 10/13/2019   K 4.2 10/13/2019   CL 103 10/13/2019   CO2 27 10/13/2019   BUN 27 (H) 10/13/2019   CREATININE 0.92 10/13/2019   GLUCOSE 98 10/13/2019    Discharge Medications:   Allergies as of 10/21/2019   No Known Allergies     Medication List    TAKE these medications  baclofen 10 MG tablet Commonly known as: LIORESAL Take 1 tablet (10 mg total) by mouth 2 (two) times daily as needed for muscle spasms.   Eliquis 2.5 MG Tabs tablet Generic drug: apixaban Take 2.5 mg by mouth 2 (two) times daily.   enalapril 10 MG tablet Commonly known as: VASOTEC Take 10 mg by mouth daily.   hydrochlorothiazide 12.5 MG tablet Commonly known as: HYDRODIURIL Take 12.5 mg by mouth daily.   HYDROcodone-acetaminophen 5-325 MG tablet Commonly known as: Norco Take 1-2 tablets by mouth every 6 (six) hours as needed for severe pain.   ondansetron 4 MG tablet Commonly known as: Zofran Take 1 tablet (4 mg total) by mouth every 8 (eight) hours as needed for nausea or vomiting.    Vitamin D-3 25 MCG (1000 UT) Caps Take 1,000 Units by mouth daily with breakfast.       Diagnostic Studies: DG C-Arm 1-60 Min-No Report  Result Date: 10/20/2019 Fluoroscopy was utilized by the requesting physician.  No radiographic interpretation.   CUP PACEART REMOTE DEVICE CHECK  Result Date: 09/24/2019 Scheduled remote reviewed.  Normal device function.  6 AT/AF longest 3 hours. On OAC. Next remote 91 days.  DG HIP OPERATIVE UNILAT W OR W/O PELVIS LEFT  Result Date: 10/20/2019 CLINICAL DATA:  Left total hip replacement EXAM: OPERATIVE LEFT HIP (WITH PELVIS IF PERFORMED) 2 VIEWS TECHNIQUE: Fluoroscopic spot image(s) were submitted for interpretation post-operatively. COMPARISON:  No comparison studies available. FINDINGS: Frontal spot fluoro images of the left hip and pubic rami show the patient to be status post left total hip replacement. No evidence for immediate hardware complications. Superior and inferior pubic rami are unremarkable. IMPRESSION: Intraoperative assessment during total hip arthroplasty. No immediate hardware complications. Electronically Signed   By: Kennith Center M.D.   On: 10/20/2019 09:03    Disposition: Discharge disposition: 01-Home or Self Care       Discharge Instructions    Discharge patient   Complete by: As directed    Discharge disposition: 01-Home or Self Care   Discharge patient date: 10/21/2019      Follow-up Information    Sheral Apley, MD.   Specialty: Orthopedic Surgery Contact information: [address] [place] [address] [phone]            Signed: Albina Billet III PA-C 10/21/2019, 7:17 AM

## 2019-10-21 NOTE — Care Management Obs Status (Signed)
MEDICARE OBSERVATION STATUS NOTIFICATION   Patient Details  Name: Kathryn Strickland MRN: [id number] Date of Birth: 08-13-1933   Medicare Observation Status Notification Given:  Yes    Clearance Coots, LCSW 10/21/2019, 10:13 AM

## 2019-10-21 NOTE — Plan of Care (Signed)
Pt ready for DC home 

## 2019-10-21 NOTE — Progress Notes (Signed)
Physical Therapy Treatment Patient Details Name: Kathryn Strickland MRN: [id number] DOB: [birth year] Today's Date: 10/21/2019    History of Present Illness Pt is 86 yo female s/p L anterior THA on 10/20/19.  Pt with PMH of pacemaker and R THA (see H and P for full PMH)    PT Comments    Pt progressing very well with PT; ready for d/c with family support; reviewed gait/RW safety, transfer safety and HEP.    Follow Up Recommendations  Follow surgeon's recommendation for DC plan and follow-up therapies     Equipment Recommendations  None recommended by PT    Recommendations for Other Services       Precautions / Restrictions Precautions Precautions: Fall Restrictions Weight Bearing Restrictions: No LLE Weight Bearing: Weight bearing as tolerated    Mobility  Bed Mobility               General bed mobility comments: in recliner  Transfers Overall transfer level: Needs assistance Equipment used: Rolling walker (2 wheeled) Transfers: Sit to/from Stand Sit to Stand: Min guard         General transfer comment: cues for safe hand placement  Ambulation/Gait Ambulation/Gait assistance: Min guard Gait Distance (Feet): 15 Feet(x2) Assistive device: Rolling walker (2 wheeled) Gait Pattern/deviations: Step-through pattern;Decreased stride length     General Gait Details: cues for sequence initially and progression to step through as pain allowed    Stairs             Wheelchair Mobility    Modified Rankin (Stroke Patients Only)       Balance                                            Cognition Arousal/Alertness: Awake/alert Behavior During Therapy: WFL for tasks assessed/performed Overall Cognitive Status: Within Functional Limits for tasks assessed                                        Exercises Total Joint Exercises Short Arc Quad: AROM;Left;10 reps Long Arc Quad: AROM;Left;10 reps;Seated General  Exercises - Lower Extremity Ankle Circles/Pumps: AROM;Both;10 reps Quad Sets: AROM;Seated;Both;10 reps Heel Slides: AAROM;Left;10 reps;Supine Hip ABduction/ADduction: AAROM;Supine;Left;10 reps    General Comments        Pertinent Vitals/Pain Pain Assessment: 0-10 Pain Score: 1  Pain Location: L hip Pain Descriptors / Indicators: Sore;Discomfort    Home Living                      Prior Function            PT Goals (current goals can now be found in the care plan section) Acute Rehab PT Goals Patient Stated Goal: return to son's home tomorrow PT Goal Formulation: With patient/family Time For Goal Achievement: 11/03/19 Potential to Achieve Goals: Good Progress towards PT goals: Progressing toward goals    Frequency    7X/week      PT Plan Current plan remains appropriate    Co-evaluation              AM-PAC PT "6 Clicks" Mobility   Outcome Measure  Help needed turning from your back to your side while in a flat bed without using bedrails?: A Little Help needed moving from lying on your  back to sitting on the side of a flat bed without using bedrails?: A Little Help needed moving to and from a bed to a chair (including a wheelchair)?: A Little Help needed standing up from a chair using your arms (e.g., wheelchair or bedside chair)?: A Little Help needed to walk in hospital room?: A Little Help needed climbing 3-5 steps with a railing? : A Little 6 Click Score: 18    End of Session Equipment Utilized During Treatment: Gait belt Activity Tolerance: Patient tolerated treatment well Patient left: in chair;with call bell/phone within reach;with chair alarm set Nurse Communication: Mobility status PT Visit Diagnosis: Other abnormalities of gait and mobility (R26.89);Muscle weakness (generalized) (M62.81)     Time: 1436-1500 PT Time Calculation (min) (ACUTE ONLY): 24 min  Charges:  $Gait Training: 8-22 mins $Therapeutic Exercise: 8-22 mins                      Delice Bison, PT   Acute Rehab Dept [facility]): [phone]   10/21/2019    [facility] 10/21/2019, 3:59 PM

## 2019-10-26 ENCOUNTER — Telehealth: Payer: Self-pay | Admitting: *Deleted

## 2019-10-26 NOTE — Telephone Encounter (Signed)
Unscheduled PPM transmission received 10/23/19 at 19:30. Presenting rhythm AS/VP 98bpm with PVCs. Normal PPM function. 14 AT/AF episodes (1.6%), longest 7hrs on 10/23/19 beginning at 00:53. 1 NSVT episode, 6 beats duration. Pt is s/p left total hip arthroplasty on 10/20/19, Eliquis resumed prior to discharge.  Spoke with patient's son, Kathryn Strickland ([place]). Advised of unscheduled transmission. He reports it was probably sent on accident. Pt did have an event on 1/22 around that time when her fingers on her right hand became numb, turned purple. O2 sat unable to read on those fingers at the time. Kathryn Strickland called [facility] RN, rechecked pt a short time later and symptoms had resolved. No recurrent symptoms since that time. Confirmed pt is compliant with Eliquis. Advised will make Dr. Elberta Fortis aware and call back if any new recommendations. Kathryn Strickland in agreement with plan, no further questions at this time.

## 2019-10-30 NOTE — Telephone Encounter (Signed)
Late entry from 10/26/19:  No new orders or recommendations from Dr. Elberta Fortis at this time.

## 2019-11-16 ENCOUNTER — Other Ambulatory Visit: Payer: Self-pay | Admitting: *Deleted

## 2019-11-16 MED ORDER — APIXABAN 2.5 MG PO TABS: 2.5000 mg | ORAL_TABLET | Freq: Two times a day (BID) | ORAL | 10 refills | Status: DC

## 2019-11-16 NOTE — Telephone Encounter (Signed)
Eliquis 2.5mg  refill request received, pt is 86 yrs old, weight-53.5kg, Crea-0.92 on 10/13/2019, Diagnosis-Afib, and last seen by Dr. Elberta Fortis on 10/13/2019. Dose is appropriate based on dosing criteria. Will send in refill to requested pharmacy.

## 2019-12-04 ENCOUNTER — Encounter: Payer: Self-pay | Admitting: Internal Medicine

## 2019-12-24 ENCOUNTER — Ambulatory Visit (INDEPENDENT_AMBULATORY_CARE_PROVIDER_SITE_OTHER): Payer: Medicare Other | Admitting: *Deleted

## 2019-12-24 DIAGNOSIS — I442 Atrioventricular block, complete: Secondary | ICD-10-CM

## 2019-12-24 LAB — CUP PACEART REMOTE DEVICE CHECK
Battery Remaining Longevity: 122 mo
Battery Voltage: 3.04 V
Brady Statistic AP VP Percent: 2.3 %
Brady Statistic AP VS Percent: 0 %
Brady Statistic AS VP Percent: 94.36 %
Brady Statistic AS VS Percent: 3.33 %
Brady Statistic RA Percent Paced: 2.87 %
Brady Statistic RV Percent Paced: 96.67 %
Date Time Interrogation Session: 20210325023303
Implantable Lead Implant Date: 20200624
Implantable Lead Implant Date: 20200624
Implantable Lead Location: 753859
Implantable Lead Location: 753860
Implantable Lead Model: 5076
Implantable Lead Model: 5076
Implantable Pulse Generator Implant Date: 20200624
Lead Channel Impedance Value: 304 Ohm
Lead Channel Impedance Value: 323 Ohm
Lead Channel Impedance Value: 361 Ohm
Lead Channel Impedance Value: 399 Ohm
Lead Channel Pacing Threshold Amplitude: 0.5 V
Lead Channel Pacing Threshold Amplitude: 0.75 V
Lead Channel Pacing Threshold Pulse Width: 0.4 ms
Lead Channel Pacing Threshold Pulse Width: 0.4 ms
Lead Channel Sensing Intrinsic Amplitude: 3.125 mV
Lead Channel Sensing Intrinsic Amplitude: 3.125 mV
Lead Channel Setting Pacing Amplitude: 1.5 V
Lead Channel Setting Pacing Amplitude: 2.5 V
Lead Channel Setting Pacing Pulse Width: 0.4 ms
Lead Channel Setting Sensing Sensitivity: 2.8 mV

## 2019-12-24 NOTE — Progress Notes (Signed)
PPM Remote  

## 2020-03-17 ENCOUNTER — Ambulatory Visit: Payer: Medicare Other | Admitting: Internal Medicine

## 2020-03-23 ENCOUNTER — Other Ambulatory Visit: Payer: Self-pay

## 2020-03-23 ENCOUNTER — Encounter: Payer: Self-pay | Admitting: Internal Medicine

## 2020-03-23 ENCOUNTER — Ambulatory Visit (INDEPENDENT_AMBULATORY_CARE_PROVIDER_SITE_OTHER): Payer: Medicare Other | Admitting: Internal Medicine

## 2020-03-23 VITALS — BP 120/84 | HR 75 | Temp 97.3°F | Wt 132.4 lb

## 2020-03-23 DIAGNOSIS — I429 Cardiomyopathy, unspecified: Secondary | ICD-10-CM

## 2020-03-23 DIAGNOSIS — I442 Atrioventricular block, complete: Secondary | ICD-10-CM

## 2020-03-23 DIAGNOSIS — N183 Chronic kidney disease, stage 3 unspecified: Secondary | ICD-10-CM

## 2020-03-23 DIAGNOSIS — I10 Essential (primary) hypertension: Secondary | ICD-10-CM

## 2020-03-23 DIAGNOSIS — I4891 Unspecified atrial fibrillation: Secondary | ICD-10-CM

## 2020-03-23 DIAGNOSIS — M160 Bilateral primary osteoarthritis of hip: Secondary | ICD-10-CM

## 2020-03-23 NOTE — Progress Notes (Signed)
Established Patient Office Visit     This visit occurred during the SARS-CoV-2 public health emergency.  Safety protocols were in place, including screening questions prior to the visit, additional usage of staff PPE, and extensive cleaning of exam room while observing appropriate contact time as indicated for disinfecting solutions.    CC/Reason for Visit: 37-month follow-up chronic conditions  HPI: Kathryn Strickland is a 86 y.o. female who is coming in today for the above mentioned reasons. Past Medical History is significant for: Hypertension that has been well controlled, atrial fibrillation anticoagulated on Eliquis, history of complete heart block status post permanent pacemaker.  She follows routinely with cardiology.  In the past 8 months she has had both hips replaced and has recovered remarkably well.  She has not yet had her Covid vaccinations and does not believe she would like to get them despite counseling.  She has not yet had her wellness visit this year.  She has no complaints.   Past Medical/Surgical History: Past Medical History:  Diagnosis Date  . Arthritis    Hips,Knees   . Atrial fibrillation (HCC) 04/07/2019  . Cardiomyopathy (HCC) 04/20/2019   EF 45% with apical WMA- no history of CAD or symptoms of angina  . Carotid bruit    LATERAL UNSPECIFIED  . Chronic pain   . Complete heart block (HCC) 03/2019  . CRI (chronic renal insufficiency), stage 3 (moderate) 04/20/2019  . Dementia (HCC)    Short term memory  . Genital disorder, female   . Hypercalcemia   . Hypercholesterolemia   . Hyperlipidemia   . Hypertension   . Osteoporosis   . Postmenopausal   . Presence of permanent cardiac pacemaker 03/25/2019   for complete heart block  . Primary osteoarthritis of right hip 05/18/2019  . Vitamin D deficiency     Past Surgical History:  Procedure Laterality Date  . COLONOSCOPY    . PACEMAKER IMPLANT N/A 03/25/2019   Procedure: PACEMAKER IMPLANT;   Surgeon: Regan Lemming, MD;  Location: [facility] INVASIVE [facility];  Service: Cardiovascular;  Laterality: N/A;  . TOTAL HIP ARTHROPLASTY Right 06/09/2019   Procedure: TOTAL HIP ARTHROPLASTY ANTERIOR APPROACH;  Surgeon: Sheral Apley, MD;  Location: [facility] OR[facility];  Service: Orthopedics;  Laterality: Right;  . TOTAL HIP ARTHROPLASTY Left 10/20/2019   Procedure: TOTAL HIP ARTHROPLASTY ANTERIOR APPROACH;  Surgeon: Sheral Apley, MD;  Location: [facility] OR[facility];  Service: Orthopedics;  Laterality: Left;    Social History:  reports that she has never smoked. She has never used smokeless tobacco. She reports that she does not drink alcohol and does not use drugs.  Allergies: No Known Allergies  Family History:  Family History  Problem Relation Age of Onset  . Heart disease Father   . CAD Father 17  . Colon cancer Mother 71     Current Outpatient Medications:  .  apixaban (ELIQUIS) 2.5 MG TABS tablet, Take 1 tablet (2.5 mg total) by mouth 2 (two) times daily., Disp: 60 tablet, Rfl: 10 .  Cholecalciferol (VITAMIN D-3) 25 MCG (1000 UT) CAPS, Take 1,000 Units by mouth daily with breakfast., Disp: , Rfl:  .  enalapril (VASOTEC) 10 MG tablet, Take 10 mg by mouth daily., Disp: , Rfl:  .  hydrochlorothiazide (HYDRODIURIL) 12.5 MG tablet, Take 12.5 mg by mouth daily., Disp: , Rfl:   Review of Systems:  Constitutional: Denies fever, chills, diaphoresis, appetite change and fatigue.  HEENT: Denies photophobia, eye pain, redness, hearing loss, ear pain,  congestion, sore throat, rhinorrhea, sneezing, mouth sores, trouble swallowing, neck pain, neck stiffness and tinnitus.   Respiratory: Denies SOB, DOE, cough, chest tightness,  and wheezing.   Cardiovascular: Denies chest pain, palpitations and leg swelling.  Gastrointestinal: Denies nausea, vomiting, abdominal pain, diarrhea, constipation, blood in stool and abdominal distention.  Genitourinary: Denies dysuria, urgency, frequency, hematuria, flank pain and  difficulty urinating.  Endocrine: Denies: hot or cold intolerance, sweats, changes in hair or nails, polyuria, polydipsia. Musculoskeletal: Denies myalgias, back pain, joint swelling, arthralgias and gait problem.  Skin: Denies pallor, rash and wound.  Neurological: Denies dizziness, seizures, syncope, weakness, light-headedness, numbness and headaches.  Hematological: Denies adenopathy. Easy bruising, personal or family bleeding history  Psychiatric/Behavioral: Denies suicidal ideation, mood changes, confusion, nervousness, sleep disturbance and agitation    Physical Exam: Vitals:   03/23/20 1105  BP: 120/84  Pulse: 75  Temp: (!) 97.3 F (36.3 C)  TempSrc: Temporal  SpO2: 99%  Weight: 132 lb 6.4 oz (60.1 kg)    Body mass index is 20.13 kg/m.   Constitutional: NAD, calm, comfortable Eyes: PERRL, lids and conjunctivae normal, wears corrective lenses ENMT: Mucous membranes are moist. Respiratory: clear to auscultation bilaterally, no wheezing, no crackles. Normal respiratory effort. No accessory muscle use.  Cardiovascular: Regular rate and rhythm, no murmurs / rubs / gallops. No extremity edema.   Neurologic: Grossly intact and nonfocal Psychiatric: Normal judgment and insight. Alert and oriented x 3. Normal mood.    Impression and Plan:  Primary osteoarthritis of both hips -Status post bilateral hip replacements this year.  Complete heart block ([place]) -Status post permanent pacemaker, followed by cardiology  Essential hypertension -Well-controlled on enalapril and hydrochlorothiazide.  Atrial fibrillation, unspecified type ([place]) -Appears to be in sinus rhythm today, anticoagulated on Eliquis.  CRI (chronic renal insufficiency), stage 3 (moderate) -Baseline creatinine is between 0.8 and 0.9.    Patient Instructions  -Nice seeing you today!!  -See you back in 4 months for your wellness visit. Please come in fasting.     Lelon Frohlich, MD [place]  [facility] at [facility]

## 2020-03-23 NOTE — Patient Instructions (Signed)
-  Nice seeing you today!!  -See you back in 4 months for your wellness visit. Please come in fasting.

## 2020-03-24 ENCOUNTER — Ambulatory Visit (INDEPENDENT_AMBULATORY_CARE_PROVIDER_SITE_OTHER): Payer: Medicare Other | Admitting: *Deleted

## 2020-03-24 DIAGNOSIS — I442 Atrioventricular block, complete: Secondary | ICD-10-CM | POA: Diagnosis not present

## 2020-03-24 LAB — CUP PACEART REMOTE DEVICE CHECK
Battery Remaining Longevity: 118 mo
Battery Voltage: 3.02 V
Brady Statistic AP VP Percent: 0.97 %
Brady Statistic AP VS Percent: 0 %
Brady Statistic AS VP Percent: 98.99 %
Brady Statistic AS VS Percent: 0.04 %
Brady Statistic RA Percent Paced: 0.98 %
Brady Statistic RV Percent Paced: 99.96 %
Date Time Interrogation Session: 20210624025357
Implantable Lead Implant Date: 20200624
Implantable Lead Implant Date: 20200624
Implantable Lead Location: 753859
Implantable Lead Location: 753860
Implantable Lead Model: 5076
Implantable Lead Model: 5076
Implantable Pulse Generator Implant Date: 20200624
Lead Channel Impedance Value: 323 Ohm
Lead Channel Impedance Value: 323 Ohm
Lead Channel Impedance Value: 380 Ohm
Lead Channel Impedance Value: 399 Ohm
Lead Channel Pacing Threshold Amplitude: 0.375 V
Lead Channel Pacing Threshold Amplitude: 0.875 V
Lead Channel Pacing Threshold Pulse Width: 0.4 ms
Lead Channel Pacing Threshold Pulse Width: 0.4 ms
Lead Channel Sensing Intrinsic Amplitude: 2.75 mV
Lead Channel Sensing Intrinsic Amplitude: 2.75 mV
Lead Channel Setting Pacing Amplitude: 1.5 V
Lead Channel Setting Pacing Amplitude: 2.5 V
Lead Channel Setting Pacing Pulse Width: 0.4 ms
Lead Channel Setting Sensing Sensitivity: 2.8 mV

## 2020-03-24 NOTE — Progress Notes (Signed)
Remote pacemaker transmission.   

## 2020-06-23 ENCOUNTER — Ambulatory Visit (INDEPENDENT_AMBULATORY_CARE_PROVIDER_SITE_OTHER): Payer: Medicare Other | Admitting: Emergency Medicine

## 2020-06-23 DIAGNOSIS — I442 Atrioventricular block, complete: Secondary | ICD-10-CM | POA: Diagnosis not present

## 2020-06-23 LAB — CUP PACEART REMOTE DEVICE CHECK
Battery Remaining Longevity: 115 mo
Battery Voltage: 3.02 V
Brady Statistic AP VP Percent: 2.49 %
Brady Statistic AP VS Percent: 0 %
Brady Statistic AS VP Percent: 97.49 %
Brady Statistic AS VS Percent: 0.02 %
Brady Statistic RA Percent Paced: 2.47 %
Brady Statistic RV Percent Paced: 99.98 %
Date Time Interrogation Session: 20210923024557
Implantable Lead Implant Date: 20200624
Implantable Lead Implant Date: 20200624
Implantable Lead Location: 753859
Implantable Lead Location: 753860
Implantable Lead Model: 5076
Implantable Lead Model: 5076
Implantable Pulse Generator Implant Date: 20200624
Lead Channel Impedance Value: 304 Ohm
Lead Channel Impedance Value: 323 Ohm
Lead Channel Impedance Value: 361 Ohm
Lead Channel Impedance Value: 380 Ohm
Lead Channel Pacing Threshold Amplitude: 0.5 V
Lead Channel Pacing Threshold Amplitude: 0.875 V
Lead Channel Pacing Threshold Pulse Width: 0.4 ms
Lead Channel Pacing Threshold Pulse Width: 0.4 ms
Lead Channel Sensing Intrinsic Amplitude: 2.5 mV
Lead Channel Sensing Intrinsic Amplitude: 2.5 mV
Lead Channel Setting Pacing Amplitude: 1.5 V
Lead Channel Setting Pacing Amplitude: 2.5 V
Lead Channel Setting Pacing Pulse Width: 0.4 ms
Lead Channel Setting Sensing Sensitivity: 2.8 mV

## 2020-06-28 NOTE — Progress Notes (Signed)
Remote pacemaker transmission.   

## 2020-07-22 ENCOUNTER — Encounter: Payer: Self-pay | Admitting: Internal Medicine

## 2020-07-22 ENCOUNTER — Other Ambulatory Visit: Payer: Self-pay

## 2020-07-22 ENCOUNTER — Ambulatory Visit (INDEPENDENT_AMBULATORY_CARE_PROVIDER_SITE_OTHER): Payer: Medicare Other | Admitting: Internal Medicine

## 2020-07-22 VITALS — BP 122/80 | HR 75 | Temp 97.5°F | Ht 65.5 in | Wt 133.0 lb

## 2020-07-22 DIAGNOSIS — I10 Essential (primary) hypertension: Secondary | ICD-10-CM | POA: Diagnosis not present

## 2020-07-22 DIAGNOSIS — Z23 Encounter for immunization: Secondary | ICD-10-CM | POA: Diagnosis not present

## 2020-07-22 DIAGNOSIS — I442 Atrioventricular block, complete: Secondary | ICD-10-CM

## 2020-07-22 DIAGNOSIS — I4891 Unspecified atrial fibrillation: Secondary | ICD-10-CM

## 2020-07-22 DIAGNOSIS — Z0000 Encounter for general adult medical examination without abnormal findings: Secondary | ICD-10-CM

## 2020-07-22 DIAGNOSIS — N183 Chronic kidney disease, stage 3 unspecified: Secondary | ICD-10-CM

## 2020-07-22 DIAGNOSIS — R413 Other amnesia: Secondary | ICD-10-CM

## 2020-07-22 NOTE — Addendum Note (Signed)
Addended by: Kern Reap B on: 07/22/2020 04:43 PM   Modules accepted: Orders

## 2020-07-22 NOTE — Patient Instructions (Signed)
-Nice seeing you today!!  -Lab work today; will notify you once results are available.  -Flu vaccine today.  -Remember to get COVID, Tdap and Shingles vaccines at your pharmacy.  -Schedule follow up in 6 months.   Preventive Care 84 Years and Older, Female Preventive care refers to lifestyle choices and visits with your health care provider that can promote health and wellness. This includes:  A yearly physical exam. This is also called an annual well check.  Regular dental and eye exams.  Immunizations.  Screening for certain conditions.  Healthy lifestyle choices, such as diet and exercise. What can I expect for my preventive care visit? Physical exam Your health care provider will check:  Height and weight. These may be used to calculate body mass index (BMI), which is a measurement that tells if you are at a healthy weight.  Heart rate and blood pressure.  Your skin for abnormal spots. Counseling Your health care provider may ask you questions about:  Alcohol, tobacco, and drug use.  Emotional well-being.  Home and relationship well-being.  Sexual activity.  Eating habits.  History of falls.  Memory and ability to understand (cognition).  Work and work Statistician.  Pregnancy and menstrual history. What immunizations do I need?  Influenza (flu) vaccine  This is recommended every year. Tetanus, diphtheria, and pertussis (Tdap) vaccine  You may need a Td booster every 10 years. Varicella (chickenpox) vaccine  You may need this vaccine if you have not already been vaccinated. Zoster (shingles) vaccine  You may need this after age 35. Pneumococcal conjugate (PCV13) vaccine  One dose is recommended after age 27. Pneumococcal polysaccharide (PPSV23) vaccine  One dose is recommended after age 42. Measles, mumps, and rubella (MMR) vaccine  You may need at least one dose of MMR if you were born in 1957 or later. You may also need a second  dose. Meningococcal conjugate (MenACWY) vaccine  You may need this if you have certain conditions. Hepatitis A vaccine  You may need this if you have certain conditions or if you travel or work in places where you may be exposed to hepatitis A. Hepatitis B vaccine  You may need this if you have certain conditions or if you travel or work in places where you may be exposed to hepatitis B. Haemophilus influenzae type b (Hib) vaccine  You may need this if you have certain conditions. You may receive vaccines as individual doses or as more than one vaccine together in one shot (combination vaccines). Talk with your health care provider about the risks and benefits of combination vaccines. What tests do I need? Blood tests  Lipid and cholesterol levels. These may be checked every 5 years, or more frequently depending on your overall health.  Hepatitis C test.  Hepatitis B test. Screening  Lung cancer screening. You may have this screening every year starting at age 84 if you have a 30-pack-year history of smoking and currently smoke or have quit within the past 15 years.  Colorectal cancer screening. All adults should have this screening starting at age 84 and continuing until age 50. Your health care provider may recommend screening at age 84 if you are at increased risk. You will have tests every 1-10 years, depending on your results and the type of screening test.  Diabetes screening. This is done by checking your blood sugar (glucose) after you have not eaten for a while (fasting). You may have this done every 1-3 years.  Mammogram. This may  be done every 1-2 years. Talk with your health care provider about how often you should have regular mammograms.  BRCA-related cancer screening. This may be done if you have a family history of breast, ovarian, tubal, or peritoneal cancers. Other tests  Sexually transmitted disease (STD) testing.  Bone density scan. This is done to screen for  osteoporosis. You may have this done starting at age 65. Follow these instructions at home: Eating and drinking  Eat a diet that includes fresh fruits and vegetables, whole grains, lean protein, and low-fat dairy products. Limit your intake of foods with high amounts of sugar, saturated fats, and salt.  Take vitamin and mineral supplements as recommended by your health care provider.  Do not drink alcohol if your health care provider tells you not to drink.  If you drink alcohol: ? Limit how much you have to 0-1 drink a day. ? Be aware of how much alcohol is in your drink. In the [place] one drink equals one 12 oz bottle of beer (355 mL), one 5 oz glass of wine (148 mL), or one 1 oz glass of hard liquor (44 mL). Lifestyle  Take daily care of your teeth and gums.  Stay active. Exercise for at least 30 minutes on 5 or more days each week.  Do not use any products that contain nicotine or tobacco, such as cigarettes, e-cigarettes, and chewing tobacco. If you need help quitting, ask your health care provider.  If you are sexually active, practice safe sex. Use a condom or other form of protection in order to prevent STIs (sexually transmitted infections).  Talk with your health care provider about taking a low-dose aspirin or statin. What's next?  Go to your health care provider once a year for a well check visit.  Ask your health care provider how often you should have your eyes and teeth checked.  Stay up to date on all vaccines. This information is not intended to replace advice given to you by your health care provider. Make sure you discuss any questions you have with your health care provider. Document Revised: 09/11/2018 Document Reviewed: 09/11/2018 Elsevier Patient Education  2020 [employer]  

## 2020-07-22 NOTE — Progress Notes (Signed)
Established Patient Office Visit     This visit occurred during the SARS-CoV-2 public health emergency.  Safety protocols were in place, including screening questions prior to the visit, additional usage of staff PPE, and extensive cleaning of exam room while observing appropriate contact time as indicated for disinfecting solutions.    CC/Reason for Visit: Subsequent Medicare wellness visit and follow-up chronic medical conditions  HPI: Kathryn Strickland is a 86 y.o. female who is coming in today for the above mentioned reasons. Past Medical History is significant for: Hypertension that has been well controlled, atrial fibrillation anticoagulated on Eliquis, history of complete heart block status post permanent pacemaker.  She follows routinely with cardiology.  In the past 8 months she has had both hips replaced and has recovered remarkably well.  She is here with her daughter, per the daughter she is having some short-term memory issues like asking the same question within the span of 15 minutes, however she has no issues remembering appointments or to take her medications.  Otherwise she has been doing well.  She has decided not to receive her Covid vaccines but is willing to get her flu vaccine today.  She is also due for her shingles and Tdap.  She elects to defer all further cancer screening.  She has routine dental care but no eye care.   Past Medical/Surgical History: Past Medical History:  Diagnosis Date  . Arthritis    Hips,Knees   . Atrial fibrillation ([place]) 04/07/2019  . Cardiomyopathy ([place]) 04/20/2019   EF 45% with apical WMA- no history of CAD or symptoms of angina  . Carotid bruit    LATERAL UNSPECIFIED  . Chronic pain   . Complete heart block ([place]) 03/2019  . CRI (chronic renal insufficiency), stage 3 (moderate) ([place]) 04/20/2019  . Dementia ([place])    Short term memory  . Genital disorder, female   . Hypercalcemia   . Hypercholesterolemia   . Hyperlipidemia   .  Hypertension   . Osteoporosis   . Postmenopausal   . Presence of permanent cardiac pacemaker 03/25/2019   for complete heart block  . Primary osteoarthritis of right hip 05/18/2019  . Vitamin D deficiency     Past Surgical History:  Procedure Laterality Date  . COLONOSCOPY    . PACEMAKER IMPLANT N/A 03/25/2019   Procedure: PACEMAKER IMPLANT;  Surgeon: Constance Haw, MD;  Location: [facility];  Service: Cardiovascular;  Laterality: N/A;  . TOTAL HIP ARTHROPLASTY Right 06/09/2019   Procedure: TOTAL HIP ARTHROPLASTY ANTERIOR APPROACH;  Surgeon: Renette Butters, MD;  Location: [facility];  Service: Orthopedics;  Laterality: Right;  . TOTAL HIP ARTHROPLASTY Left 10/20/2019   Procedure: TOTAL HIP ARTHROPLASTY ANTERIOR APPROACH;  Surgeon: Renette Butters, MD;  Location: [facility];  Service: Orthopedics;  Laterality: Left;    Social History:  reports that she has never smoked. She has never used smokeless tobacco. She reports that she does not drink alcohol and does not use drugs.  Allergies: No Known Allergies  Family History:  Family History  Problem Relation Age of Onset  . Heart disease Father   . CAD Father 34  . Colon cancer Mother 15     Current Outpatient Medications:  .  apixaban (ELIQUIS) 2.5 MG TABS tablet, Take 1 tablet (2.5 mg total) by mouth 2 (two) times daily., Disp: 60 tablet, Rfl: 10 .  Cholecalciferol (VITAMIN D-3) 25 MCG (1000 UT) CAPS, Take 1,000 Units by mouth daily with  breakfast., Disp: , Rfl:  .  enalapril (VASOTEC) 10 MG tablet, Take 10 mg by mouth daily., Disp: , Rfl:  .  hydrochlorothiazide (HYDRODIURIL) 12.5 MG tablet, Take 12.5 mg by mouth daily., Disp: , Rfl:   Review of Systems:  Constitutional: Denies fever, chills, diaphoresis, appetite change and fatigue.  HEENT: Denies photophobia, eye pain, redness, hearing loss, ear pain, congestion, sore throat, rhinorrhea, sneezing, mouth sores, trouble swallowing, neck pain, neck stiffness and  tinnitus.   Respiratory: Denies SOB, DOE, cough, chest tightness,  and wheezing.   Cardiovascular: Denies chest pain, palpitations and leg swelling.  Gastrointestinal: Denies nausea, vomiting, abdominal pain, diarrhea, constipation, blood in stool and abdominal distention.  Genitourinary: Denies dysuria, urgency, frequency, hematuria, flank pain and difficulty urinating.  Endocrine: Denies: hot or cold intolerance, sweats, changes in hair or nails, polyuria, polydipsia. Musculoskeletal: Denies myalgias, back pain, joint swelling, arthralgias and gait problem.  Skin: Denies pallor, rash and wound.  Neurological: Denies dizziness, seizures, syncope, weakness, light-headedness, numbness and headaches.  Hematological: Denies adenopathy. Easy bruising, personal or family bleeding history  Psychiatric/Behavioral: Denies suicidal ideation, mood changes, confusion, nervousness, sleep disturbance and agitation    Physical Exam: Vitals:   07/22/20 0905  BP: 122/80  Pulse: 75  Temp: (!) 97.5 F (36.4 C)  TempSrc: Oral  SpO2: 95%  Weight: 133 lb (60.3 kg)  Height: 5' 5.5" (1.664 m)    Body mass index is 21.8 kg/m.   Constitutional: NAD, calm, comfortable Eyes: PERRL, lids and conjunctivae normal, wears corrective lenses ENMT: Mucous membranes are moist. Posterior pharynx clear of any exudate or lesions. Normal dentition. Tympanic membrane is pearly white, no erythema or bulging. Neck: normal, supple, no masses, no thyromegaly Respiratory: clear to auscultation bilaterally, no wheezing, no crackles. Normal respiratory effort. No accessory muscle use.  Cardiovascular: Regular rate and rhythm, no murmurs / rubs / gallops. No extremity edema. 2+ pedal pulses. No carotid bruits.  Abdomen: no tenderness, no masses palpated. No hepatosplenomegaly. Bowel sounds positive.  Musculoskeletal: no clubbing / cyanosis. No joint deformity upper and lower extremities. Good ROM, no contractures. Normal  muscle tone.  Skin: no rashes, lesions, ulcers. No induration Neurologic: CN 2-12 grossly intact. Sensation intact, DTR normal. Strength 5/5 in all 4.  Psychiatric: Normal judgment and insight. Alert and oriented x 3. Normal mood.    Subsequent Medicare wellness visit   1. Risk factors, based on past  M,S,F -cardiovascular disease risk factors include age, history of hypertension   2.  Physical activities: She is quite sedentary   3.  Depression/mood:  Stable, not depressed   4.  Hearing:  Hears remarkably well   5.  ADL's: Independent in all ADLs   6.  Fall risk:  High fall risk due to age and prior history of falls.   7.  Home safety: No problems identified   8.  Height weight, and visual acuity: height and weight as above, vision:   Visual Acuity Screening   Right eye Left eye Both eyes  Without correction:     With correction: 20/25 na 20/25     9.  Counseling:  Advised continued follow-up with her cardiologist, would recommend eye exam as well as Covid, shingles, Tdap vaccines   10. Lab orders based on risk factors: Laboratory update will be reviewed   11. Referral :  None today   12. Care plan:  Follow-up with me in 6 months   13. Cognitive assessment:  No cognitive impairment is readily identified, although   the daughter does mention some short-term memory issues   14. Screening: Patient provided with a written and personalized 5-10 year screening schedule in the AVS.   yes   15. Provider List Update:   PCP, cardiology  16. Advance Directives: Full code     Office Visit from 07/22/2020 in [facility]  PHQ-9 Total Score 0      Fall Risk  03/24/2019  Falls in the past year? 1  Number falls in past yr: 0  Injury with Fall? 0     Impression and Plan:  Encounter for preventive health examination -She has routine dental care, have advised routine eye care. -Flu vaccine today.  Have advised Covid, Tdap, shingles vaccines at her  pharmacy. -Screening labs today. -Healthy lifestyle discussed in detail, including benefits of adding some low intensity physical activity to her daily routine. -She has elected to defer all further cancer screening due to age, I agree. -She declines bone density testing.  Atrial fibrillation, unspecified type (HCC) -She is pacemaker dependent, anticoagulated on Eliquis, followed by cardiology.  Primary hypertension  -Well-controlled, continue hydrochlorothiazide 12.5 mg and enalapril 10 mg daily.  Complete heart block ([facility]) -Status post permanent pacemaker  CRI (chronic renal insufficiency), stage 3 (moderate) -Baseline creatinine remains around 0.9  Need for influenza vaccination -Flu vaccine administered today  Memory loss, short term  - Plan: TSH, Vitamin B12, VITAMIN D 25 Hydroxy (Vit-D Deficiency, Fractures) -Consider MMSE next visit    Patient Instructions  -Nice seeing you today!!  -Lab work today; will notify you once results are available.  -Flu vaccine today.  -Remember to get COVID, Tdap and Shingles vaccines at your pharmacy.  -Schedule follow up in 6 months.   Preventive Care 65 Years and Older, Female Preventive care refers to lifestyle choices and visits with your health care provider that can promote health and wellness. This includes:  A yearly physical exam. This is also called an annual well check.  Regular dental and eye exams.  Immunizations.  Screening for certain conditions.  Healthy lifestyle choices, such as diet and exercise. What can I expect for my preventive care visit? Physical exam Your health care provider will check:  Height and weight. These may be used to calculate body mass index (BMI), which is a measurement that tells if you are at a healthy weight.  Heart rate and blood pressure.  Your skin for abnormal spots. Counseling Your health care provider may ask you questions about:  Alcohol, tobacco, and drug  use.  Emotional well-being.  Home and relationship well-being.  Sexual activity.  Eating habits.  History of falls.  Memory and ability to understand (cognition).  Work and work environment.  Pregnancy and menstrual history. What immunizations do I need?  Influenza (flu) vaccine  This is recommended every year. Tetanus, diphtheria, and pertussis (Tdap) vaccine  You may need a Td booster every 10 years. Varicella (chickenpox) vaccine  You may need this vaccine if you have not already been vaccinated. Zoster (shingles) vaccine  You may need this after age 60. Pneumococcal conjugate (PCV13) vaccine  One dose is recommended after age 84. Pneumococcal polysaccharide (PPSV23) vaccine  One dose is recommended after age 84. Measles, mumps, and rubella (MMR) vaccine  You may need at least one dose of MMR if you were born in 1957 or later. You may also need a second dose. Meningococcal conjugate (MenACWY) vaccine  You may need this if you have certain conditions. Hepatitis A vaccine  You   may need this if you have certain conditions or if you travel or work in places where you may be exposed to hepatitis A. Hepatitis B vaccine  You may need this if you have certain conditions or if you travel or work in places where you may be exposed to hepatitis B. Haemophilus influenzae type b (Hib) vaccine  You may need this if you have certain conditions. You may receive vaccines as individual doses or as more than one vaccine together in one shot (combination vaccines). Talk with your health care provider about the risks and benefits of combination vaccines. What tests do I need? Blood tests  Lipid and cholesterol levels. These may be checked every 5 years, or more frequently depending on your overall health.  Hepatitis C test.  Hepatitis B test. Screening  Lung cancer screening. You may have this screening every year starting at age 55 if you have a 30-pack-year history of  smoking and currently smoke or have quit within the past 15 years.  Colorectal cancer screening. All adults should have this screening starting at age 50 and continuing until age 75. Your health care provider may recommend screening at age 45 if you are at increased risk. You will have tests every 1-10 years, depending on your results and the type of screening test.  Diabetes screening. This is done by checking your blood sugar (glucose) after you have not eaten for a while (fasting). You may have this done every 1-3 years.  Mammogram. This may be done every 1-2 years. Talk with your health care provider about how often you should have regular mammograms.  BRCA-related cancer screening. This may be done if you have a family history of breast, ovarian, tubal, or peritoneal cancers. Other tests  Sexually transmitted disease (STD) testing.  Bone density scan. This is done to screen for osteoporosis. You may have this done starting at age 84. Follow these instructions at home: Eating and drinking  Eat a diet that includes fresh fruits and vegetables, whole grains, lean protein, and low-fat dairy products. Limit your intake of foods with high amounts of sugar, saturated fats, and salt.  Take vitamin and mineral supplements as recommended by your health care provider.  Do not drink alcohol if your health care provider tells you not to drink.  If you drink alcohol: ? Limit how much you have to 0-1 drink a day. ? Be aware of how much alcohol is in your drink. In the [place] one drink equals one 12 oz bottle of beer (355 mL), one 5 oz glass of wine (148 mL), or one 1 oz glass of hard liquor (44 mL). Lifestyle  Take daily care of your teeth and gums.  Stay active. Exercise for at least 30 minutes on 5 or more days each week.  Do not use any products that contain nicotine or tobacco, such as cigarettes, e-cigarettes, and chewing tobacco. If you need help quitting, ask your health care  provider.  If you are sexually active, practice safe sex. Use a condom or other form of protection in order to prevent STIs (sexually transmitted infections).  Talk with your health care provider about taking a low-dose aspirin or statin. What's next?  Go to your health care provider once a year for a well check visit.  Ask your health care provider how often you should have your eyes and teeth checked.  Stay up to date on all vaccines. This information is not intended to replace advice given to you by   your health care provider. Make sure you discuss any questions you have with your health care provider. Document Revised: 09/11/2018 Document Reviewed: 09/11/2018 Elsevier Patient Education  2020 Elsevier Inc.       Hernandez Acosta, MD [facility]   

## 2020-07-23 LAB — CBC WITH DIFFERENTIAL/PLATELET
Absolute Monocytes: 518 cells/uL (ref 200–950)
Basophils Absolute: 38 cells/uL (ref 0–200)
Basophils Relative: 0.8 %
Eosinophils Absolute: 187 cells/uL (ref 15–500)
Eosinophils Relative: 3.9 %
HCT: 41.3 % (ref 35.0–45.0)
Hemoglobin: 13.4 g/dL (ref 11.7–15.5)
Lymphs Abs: 1166 cells/uL (ref 850–3900)
MCH: 28.6 pg (ref 27.0–33.0)
MCHC: 32.4 g/dL (ref 32.0–36.0)
MCV: 88.1 fL (ref 80.0–100.0)
MPV: 10.6 fL (ref 7.5–12.5)
Monocytes Relative: 10.8 %
Neutro Abs: 2890 cells/uL (ref 1500–7800)
Neutrophils Relative %: 60.2 %
Platelets: 199 10*3/uL (ref 140–400)
RBC: 4.69 10*6/uL (ref 3.80–5.10)
RDW: 13.3 % (ref 11.0–15.0)
Total Lymphocyte: 24.3 %
WBC: 4.8 10*3/uL (ref 3.8–10.8)

## 2020-07-23 LAB — LIPID PANEL
Cholesterol: 161 mg/dL (ref <200)
HDL: 48 mg/dL — ABNORMAL LOW (ref >=50)
LDL Cholesterol (Calc): 93 mg/dL (calc)
Non-HDL Cholesterol (Calc): 113 mg/dL (calc) (ref <130)
Total CHOL/HDL Ratio: 3.4 (calc) (ref <5.0)
Triglycerides: 108 mg/dL (ref <150)

## 2020-07-23 LAB — COMPREHENSIVE METABOLIC PANEL
AG Ratio: 1.8 (calc) (ref 1.0–2.5)
ALT: 15 U/L (ref 6–29)
AST: 20 U/L (ref 10–35)
Albumin: 4.3 g/dL (ref 3.6–5.1)
Alkaline phosphatase (APISO): 108 U/L (ref 37–153)
BUN/Creatinine Ratio: 20 (calc) (ref 6–22)
BUN: 21 mg/dL (ref 7–25)
CO2: 25 mmol/L (ref 20–32)
Calcium: 10.4 mg/dL (ref 8.6–10.4)
Chloride: 108 mmol/L (ref 98–110)
Creat: 1.07 mg/dL — ABNORMAL HIGH (ref 0.60–0.88)
Globulin: 2.4 g/dL (calc) (ref 1.9–3.7)
Glucose, Bld: 101 mg/dL — ABNORMAL HIGH (ref 65–99)
Potassium: 4.7 mmol/L (ref 3.5–5.3)
Sodium: 142 mmol/L (ref 135–146)
Total Bilirubin: 0.7 mg/dL (ref 0.2–1.2)
Total Protein: 6.7 g/dL (ref 6.1–8.1)

## 2020-07-23 LAB — VITAMIN B12: Vitamin B-12: 252 pg/mL (ref 200–1100)

## 2020-07-23 LAB — TSH: TSH: 0.35 mIU/L — ABNORMAL LOW (ref 0.40–4.50)

## 2020-07-23 LAB — VITAMIN D 25 HYDROXY (VIT D DEFICIENCY, FRACTURES): Vit D, 25-Hydroxy: 37 ng/mL (ref 30–100)

## 2020-07-26 ENCOUNTER — Encounter: Payer: Self-pay | Admitting: Internal Medicine

## 2020-07-26 DIAGNOSIS — R7989 Other specified abnormal findings of blood chemistry: Secondary | ICD-10-CM | POA: Insufficient documentation

## 2020-07-27 ENCOUNTER — Other Ambulatory Visit: Payer: Self-pay | Admitting: Internal Medicine

## 2020-07-27 DIAGNOSIS — R7989 Other specified abnormal findings of blood chemistry: Secondary | ICD-10-CM

## 2020-07-27 DIAGNOSIS — R5383 Other fatigue: Secondary | ICD-10-CM

## 2020-07-27 NOTE — Progress Notes (Signed)
tsh

## 2020-08-03 ENCOUNTER — Other Ambulatory Visit: Payer: Self-pay

## 2020-08-03 ENCOUNTER — Other Ambulatory Visit: Payer: Medicare Other

## 2020-08-03 DIAGNOSIS — R5383 Other fatigue: Secondary | ICD-10-CM

## 2020-08-04 ENCOUNTER — Telehealth: Payer: Self-pay | Admitting: Internal Medicine

## 2020-08-04 ENCOUNTER — Other Ambulatory Visit: Payer: Self-pay | Admitting: Internal Medicine

## 2020-08-04 DIAGNOSIS — R7989 Other specified abnormal findings of blood chemistry: Secondary | ICD-10-CM

## 2020-08-04 DIAGNOSIS — R5383 Other fatigue: Secondary | ICD-10-CM

## 2020-08-04 LAB — T3, FREE: T3, Free: 3.5 pg/mL (ref 2.3–4.2)

## 2020-08-04 LAB — T4, FREE: Free T4: 1.1 ng/dL (ref 0.8–1.8)

## 2020-08-04 LAB — TSH: TSH: 0.53 mIU/L (ref 0.40–4.50)

## 2020-08-04 MED ORDER — HYDROCHLOROTHIAZIDE 12.5 MG PO TABS
12.5000 mg | ORAL_TABLET | Freq: Every day | ORAL | 1 refills | Status: DC
Start: 2020-08-04 — End: 2021-01-19

## 2020-08-04 NOTE — Telephone Encounter (Signed)
Refill sent.

## 2020-08-04 NOTE — Telephone Encounter (Signed)
Pts son is calling to get a refill on hydrochlorothiazide (HYDRODIURIL) 12.5 MG she only has 2 pills left.   Pharm:  [facility] on [address]

## 2020-09-22 ENCOUNTER — Ambulatory Visit (INDEPENDENT_AMBULATORY_CARE_PROVIDER_SITE_OTHER): Payer: Medicare Other

## 2020-09-22 DIAGNOSIS — I442 Atrioventricular block, complete: Secondary | ICD-10-CM | POA: Diagnosis not present

## 2020-09-23 LAB — CUP PACEART REMOTE DEVICE CHECK
Battery Remaining Longevity: 112 mo
Battery Voltage: 3.01 V
Brady Statistic AP VP Percent: 1.5 %
Brady Statistic AP VS Percent: 0 %
Brady Statistic AS VP Percent: 98.48 %
Brady Statistic AS VS Percent: 0.01 %
Brady Statistic RA Percent Paced: 1.49 %
Brady Statistic RV Percent Paced: 99.99 %
Date Time Interrogation Session: 20211222222159
Implantable Lead Implant Date: 20200624
Implantable Lead Implant Date: 20200624
Implantable Lead Location: 753859
Implantable Lead Location: 753860
Implantable Lead Model: 5076
Implantable Lead Model: 5076
Implantable Pulse Generator Implant Date: 20200624
Lead Channel Impedance Value: 285 Ohm
Lead Channel Impedance Value: 304 Ohm
Lead Channel Impedance Value: 361 Ohm
Lead Channel Impedance Value: 380 Ohm
Lead Channel Pacing Threshold Amplitude: 0.5 V
Lead Channel Pacing Threshold Amplitude: 0.75 V
Lead Channel Pacing Threshold Pulse Width: 0.4 ms
Lead Channel Pacing Threshold Pulse Width: 0.4 ms
Lead Channel Sensing Intrinsic Amplitude: 2.5 mV
Lead Channel Sensing Intrinsic Amplitude: 2.5 mV
Lead Channel Setting Pacing Amplitude: 1.5 V
Lead Channel Setting Pacing Amplitude: 2.5 V
Lead Channel Setting Pacing Pulse Width: 0.4 ms
Lead Channel Setting Sensing Sensitivity: 2.8 mV

## 2020-10-05 NOTE — Progress Notes (Signed)
Remote pacemaker transmission.   

## 2020-10-15 ENCOUNTER — Other Ambulatory Visit: Payer: Self-pay | Admitting: Cardiology

## 2020-10-15 DIAGNOSIS — I4891 Unspecified atrial fibrillation: Secondary | ICD-10-CM

## 2020-10-17 NOTE — Telephone Encounter (Signed)
Prescription refill request for Eliquis received. Indication: a fib Last office visit: 07/14/19 Scr: 1.07 Age: 87 Weight: 60kg  Patient now qualifies for Eliquis 5 mg twice a day but is overdue for office visit

## 2020-10-27 ENCOUNTER — Other Ambulatory Visit: Payer: Self-pay

## 2020-10-27 ENCOUNTER — Encounter: Payer: Self-pay | Admitting: Cardiology

## 2020-10-27 ENCOUNTER — Ambulatory Visit (INDEPENDENT_AMBULATORY_CARE_PROVIDER_SITE_OTHER): Payer: Medicare Other | Admitting: Cardiology

## 2020-10-27 VITALS — BP 160/80 | HR 72 | Ht 65.5 in | Wt 138.4 lb

## 2020-10-27 DIAGNOSIS — I442 Atrioventricular block, complete: Secondary | ICD-10-CM | POA: Diagnosis not present

## 2020-10-27 NOTE — Progress Notes (Signed)
Electrophysiology Office Note   Date:  10/27/2020   ID:  Kathryn Strickland, DOB [birth year], MRN [id number]  PCP:  Philip Aspen, Limmie Patricia, MD  Cardiologist:  Elio Forget  Primary Electrophysiologist:  Kwamaine Cuppett Jorja Loa, MD    Chief Complaint: complete heart block   History of Present Illness: Kathryn Strickland is a 87 y.o. female who is being seen today for the evaluation of pacemkaer at the request of Philip Aspen, Almira Bar*. Presenting today for electrophysiology evaluation.  She has a history of hypertension, hyperlipidemia, complete heart block.  She is status post Medtronic dual-chamber pacemaker implanted 03/25/2019.    Today, denies symptoms of palpitations, chest pain, shortness of breath, orthopnea, PND, lower extremity edema, claudication, dizziness, presyncope, syncope, bleeding, or neurologic sequela. The patient is tolerating medications without difficulties.  She is feeling well.  She has no chest pain or shortness of breath.  She is not limited in her activity.  She is able to do all of her daily activities.   Past Medical History:  Diagnosis Date  . Arthritis    Hips,Knees   . Atrial fibrillation ([facility]) 04/07/2019  . Cardiomyopathy ([facility]) 04/20/2019   EF 45% with apical WMA- no history of CAD or symptoms of angina  . Carotid bruit    LATERAL UNSPECIFIED  . Chronic pain   . Complete heart block ([facility]) 03/2019  . CRI (chronic renal insufficiency), stage 3 (moderate) ([facility]) 04/20/2019  . Dementia ([facility])    Short term memory  . Genital disorder, female   . Hypercalcemia   . Hypercholesterolemia   . Hyperlipidemia   . Hypertension   . Osteoporosis   . Postmenopausal   . Presence of permanent cardiac pacemaker 03/25/2019   for complete heart block  . Primary osteoarthritis of right hip 05/18/2019  . Vitamin D deficiency    Past Surgical History:  Procedure Laterality Date  . COLONOSCOPY    . PACEMAKER IMPLANT N/A 03/25/2019   Procedure: PACEMAKER IMPLANT;   Surgeon: Regan Lemming, MD;  Location: [facility];  Service: Cardiovascular;  Laterality: N/A;  . TOTAL HIP ARTHROPLASTY Right 06/09/2019   Procedure: TOTAL HIP ARTHROPLASTY ANTERIOR APPROACH;  Surgeon: Sheral Apley, MD;  Location: [facility];  Service: Orthopedics;  Laterality: Right;  . TOTAL HIP ARTHROPLASTY Left 10/20/2019   Procedure: TOTAL HIP ARTHROPLASTY ANTERIOR APPROACH;  Surgeon: Sheral Apley, MD;  Location: [facility];  Service: Orthopedics;  Laterality: Left;     Current Outpatient Medications  Medication Sig Dispense Refill  . apixaban (ELIQUIS) 5 MG TABS tablet Take 1 tablet (5 mg total) by mouth 2 (two) times daily. 60 tablet 0  . Cholecalciferol (VITAMIN D-3) 25 MCG (1000 UT) CAPS Take 1,000 Units by mouth daily with breakfast.    . enalapril (VASOTEC) 10 MG tablet Take 10 mg by mouth daily.    . hydrochlorothiazide (HYDRODIURIL) 12.5 MG tablet Take 1 tablet (12.5 mg total) by mouth daily. 90 tablet 1   No current facility-administered medications for this visit.    Allergies:   Patient has no known allergies.   Social History:  The patient  reports that she has never smoked. She has never used smokeless tobacco. She reports that she does not drink alcohol and does not use drugs.   Family History:  The patient's family history includes CAD (age of onset: 11) in her father; Colon cancer (age of onset: 80) in her mother; Heart disease in her father.    ROS:  Please  see the history of present illness.   Otherwise, review of systems is positive for none.   All other systems are reviewed and negative.   PHYSICAL EXAM: VS:  BP (!) 160/80   Pulse 72   Ht 5' 5.5" (1.664 m)   Wt 138 lb 6.4 oz (62.8 kg)   SpO2 94%   BMI 22.68 kg/m  , BMI Body mass index is 22.68 kg/m. GEN: Well nourished, well developed, in no acute distress  HEENT: normal  Neck: no JVD, carotid bruits, or masses Cardiac: RRR; no murmurs, rubs, or gallops,no edema  Respiratory:  clear to  auscultation bilaterally, normal work of breathing GI: soft, nontender, nondistended, + BS MS: no deformity or atrophy  Skin: warm and dry, device site well healed Neuro:  Strength and sensation are intact Psych: euthymic mood, full affect  EKG:  EKG is ordered today. Personal review of the ekg ordered shows atrial sensed, ventricular paced  Personal review of the device interrogation today. Results in [facility]   Recent Labs: 07/22/2020: ALT 15; BUN 21; Creat 1.07; Hemoglobin 13.4; Platelets 199; Potassium 4.7; Sodium 142 08/03/2020: TSH 0.53    Lipid Panel     Component Value Date/Time   CHOL 161 07/22/2020 0935   TRIG 108 07/22/2020 0935   HDL 48 (L) 07/22/2020 0935   CHOLHDL 3.4 07/22/2020 0935   LDLCALC 93 07/22/2020 0935     Wt Readings from Last 3 Encounters:  10/27/20 138 lb 6.4 oz (62.8 kg)  07/22/20 133 lb (60.3 kg)  03/23/20 132 lb 6.4 oz (60.1 kg)      Other studies Reviewed: Additional studies/ records that were reviewed today include: TTE 05/05/19  Review of the above records today demonstrates:   1. The left ventricle has normal systolic function with an ejection fraction of 60-65%. The cavity size was normal. Left ventricular diastolic Doppler parameters are consistent with pseudonormalization.  2. The right ventricle has normal systolic function. The cavity was normal. There is no increase in right ventricular wall thickness.  3. Tricuspid valve regurgitation is moderate.  4. The aortic valve is abnormal. Mild thickening of the aortic valve. No stenosis of the aortic valve.  5. The aorta is normal in size and structure.  6. The aortic root and ascending aorta are normal in size and structure.  7. Grossly normal.   ASSESSMENT AND PLAN:  1.  Complete heart block: Status post Medtronic dual-chamber pacemaker implanted 03/25/2019.  Device functioning appropriately.  No changes.  2.  Hypertension: Elevated today.  Normal on prior checks.  No changes.  3.   Paroxysmal atrial fibrillation: Minimally symptomatic.  Currently on Eliquis.  CHA2DS2-VASc of 4.  No changes.     Current medicines are reviewed at length with the patient today.   The patient does not have concerns regarding her medicines.  The following changes were made today:  none  Labs/ tests ordered today include:  Orders Placed This Encounter  Procedures  . EKG 12-Lead     Disposition:   FU with Brenen Beigel 12 months  Signed, Tyrease Vandeberg Jorja Loa, MD  10/27/2020 3:07 PM     [facility] [address] [place] [address] [phone] (office) [phone] (fax)

## 2020-11-24 ENCOUNTER — Other Ambulatory Visit: Payer: Self-pay | Admitting: Cardiology

## 2020-11-24 DIAGNOSIS — I4891 Unspecified atrial fibrillation: Secondary | ICD-10-CM

## 2020-11-25 NOTE — Telephone Encounter (Signed)
Prescription refill request for Eliquis received. Indication: Afib Last office visit: 10/27/2020, [place] Scr: 1.07, 07/22/2020 Age: 87 yo  Weight: 62.8 kg   Pt is on the correct dose of Eliquis per dosing criteria, prescription refill sent in for Eliquis 5mg  BID.

## 2020-12-22 ENCOUNTER — Ambulatory Visit (INDEPENDENT_AMBULATORY_CARE_PROVIDER_SITE_OTHER): Payer: Medicare Other

## 2020-12-22 DIAGNOSIS — I429 Cardiomyopathy, unspecified: Secondary | ICD-10-CM | POA: Diagnosis not present

## 2020-12-24 LAB — CUP PACEART REMOTE DEVICE CHECK
Battery Remaining Longevity: 107 mo
Battery Voltage: 3.01 V
Brady Statistic AP VP Percent: 3.18 %
Brady Statistic AP VS Percent: 0 %
Brady Statistic AS VP Percent: 96.8 %
Brady Statistic AS VS Percent: 0.02 %
Brady Statistic RA Percent Paced: 3.15 %
Brady Statistic RV Percent Paced: 99.98 %
Date Time Interrogation Session: 20220323193616
Implantable Lead Implant Date: 20200624
Implantable Lead Implant Date: 20200624
Implantable Lead Location: 753859
Implantable Lead Location: 753860
Implantable Lead Model: 5076
Implantable Lead Model: 5076
Implantable Pulse Generator Implant Date: 20200624
Lead Channel Impedance Value: 285 Ohm
Lead Channel Impedance Value: 304 Ohm
Lead Channel Impedance Value: 342 Ohm
Lead Channel Impedance Value: 361 Ohm
Lead Channel Pacing Threshold Amplitude: 0.5 V
Lead Channel Pacing Threshold Amplitude: 0.875 V
Lead Channel Pacing Threshold Pulse Width: 0.4 ms
Lead Channel Pacing Threshold Pulse Width: 0.4 ms
Lead Channel Sensing Intrinsic Amplitude: 2.5 mV
Lead Channel Sensing Intrinsic Amplitude: 2.5 mV
Lead Channel Sensing Intrinsic Amplitude: 6.125 mV
Lead Channel Sensing Intrinsic Amplitude: 6.125 mV
Lead Channel Setting Pacing Amplitude: 1.5 V
Lead Channel Setting Pacing Amplitude: 2.5 V
Lead Channel Setting Pacing Pulse Width: 0.4 ms
Lead Channel Setting Sensing Sensitivity: 2.8 mV

## 2021-01-02 NOTE — Progress Notes (Signed)
Remote pacemaker transmission.   

## 2021-01-19 ENCOUNTER — Other Ambulatory Visit: Payer: Self-pay | Admitting: Internal Medicine

## 2021-03-22 LAB — CUP PACEART REMOTE DEVICE CHECK
Battery Remaining Longevity: 106 mo
Battery Voltage: 3.01 V
Brady Statistic AP VP Percent: 6.07 %
Brady Statistic AP VS Percent: 0 %
Brady Statistic AS VP Percent: 93.91 %
Brady Statistic AS VS Percent: 0.02 %
Brady Statistic RA Percent Paced: 6 %
Brady Statistic RV Percent Paced: 99.98 %
Date Time Interrogation Session: 20220622052504
Implantable Lead Implant Date: 20200624
Implantable Lead Implant Date: 20200624
Implantable Lead Location: 753859
Implantable Lead Location: 753860
Implantable Lead Model: 5076
Implantable Lead Model: 5076
Implantable Pulse Generator Implant Date: 20200624
Lead Channel Impedance Value: 304 Ohm
Lead Channel Impedance Value: 304 Ohm
Lead Channel Impedance Value: 380 Ohm
Lead Channel Impedance Value: 380 Ohm
Lead Channel Pacing Threshold Amplitude: 0.5 V
Lead Channel Pacing Threshold Amplitude: 1 V
Lead Channel Pacing Threshold Pulse Width: 0.4 ms
Lead Channel Pacing Threshold Pulse Width: 0.4 ms
Lead Channel Sensing Intrinsic Amplitude: 12.625 mV
Lead Channel Sensing Intrinsic Amplitude: 12.625 mV
Lead Channel Sensing Intrinsic Amplitude: 2.75 mV
Lead Channel Setting Pacing Amplitude: 1.5 V
Lead Channel Setting Pacing Amplitude: 2.5 V
Lead Channel Setting Pacing Pulse Width: 0.4 ms
Lead Channel Setting Sensing Sensitivity: 2.8 mV

## 2021-03-23 ENCOUNTER — Ambulatory Visit (INDEPENDENT_AMBULATORY_CARE_PROVIDER_SITE_OTHER): Payer: Medicare Other

## 2021-03-23 DIAGNOSIS — I429 Cardiomyopathy, unspecified: Secondary | ICD-10-CM | POA: Diagnosis not present

## 2021-04-11 NOTE — Progress Notes (Signed)
Remote pacemaker transmission.   

## 2021-06-04 ENCOUNTER — Other Ambulatory Visit: Payer: Self-pay | Admitting: Cardiology

## 2021-06-04 DIAGNOSIS — I4891 Unspecified atrial fibrillation: Secondary | ICD-10-CM

## 2021-06-06 NOTE — Telephone Encounter (Signed)
Prescription refill request for Eliquis received. Indication: Afib  Last office visit: 10/27/20 ([place]) Scr: 1.07 (07/22/21)  Age: 87 Weight: 62.8kg   Appropriate dose and refill sent to requested pharmacy.

## 2021-06-21 LAB — CUP PACEART REMOTE DEVICE CHECK
Battery Remaining Longevity: 102 mo
Battery Voltage: 3 V
Brady Statistic AP VP Percent: 17.28 %
Brady Statistic AP VS Percent: 0 %
Brady Statistic AS VP Percent: 82.58 %
Brady Statistic AS VS Percent: 0.14 %
Brady Statistic RA Percent Paced: 17.29 %
Brady Statistic RV Percent Paced: 99.86 %
Date Time Interrogation Session: 20220921063611
Implantable Lead Implant Date: 20200624
Implantable Lead Implant Date: 20200624
Implantable Lead Location: 753859
Implantable Lead Location: 753860
Implantable Lead Model: 5076
Implantable Lead Model: 5076
Implantable Pulse Generator Implant Date: 20200624
Lead Channel Impedance Value: 304 Ohm
Lead Channel Impedance Value: 304 Ohm
Lead Channel Impedance Value: 342 Ohm
Lead Channel Impedance Value: 361 Ohm
Lead Channel Pacing Threshold Amplitude: 0.375 V
Lead Channel Pacing Threshold Amplitude: 1 V
Lead Channel Pacing Threshold Pulse Width: 0.4 ms
Lead Channel Pacing Threshold Pulse Width: 0.4 ms
Lead Channel Sensing Intrinsic Amplitude: 12.625 mV
Lead Channel Sensing Intrinsic Amplitude: 12.625 mV
Lead Channel Sensing Intrinsic Amplitude: 2.625 mV
Lead Channel Sensing Intrinsic Amplitude: 2.625 mV
Lead Channel Setting Pacing Amplitude: 1.5 V
Lead Channel Setting Pacing Amplitude: 2.5 V
Lead Channel Setting Pacing Pulse Width: 0.4 ms
Lead Channel Setting Sensing Sensitivity: 2.8 mV

## 2021-06-22 ENCOUNTER — Ambulatory Visit (INDEPENDENT_AMBULATORY_CARE_PROVIDER_SITE_OTHER): Payer: Medicare Other

## 2021-06-22 DIAGNOSIS — I442 Atrioventricular block, complete: Secondary | ICD-10-CM | POA: Diagnosis not present

## 2021-06-28 NOTE — Progress Notes (Signed)
Remote pacemaker transmission.   

## 2021-07-12 ENCOUNTER — Other Ambulatory Visit: Payer: Self-pay | Admitting: Internal Medicine

## 2021-07-26 ENCOUNTER — Other Ambulatory Visit: Payer: Self-pay | Admitting: Internal Medicine

## 2021-08-08 ENCOUNTER — Telehealth: Payer: Self-pay

## 2021-08-08 MED ORDER — ENALAPRIL MALEATE 10 MG PO TABS: 10.0000 mg | ORAL_TABLET | Freq: Every day | ORAL | 2 refills | Status: DC

## 2021-08-08 NOTE — Telephone Encounter (Signed)
Daughter of patient called requesting Rx refill  enalapril (VASOTEC) 10 MG tablet

## 2021-08-28 ENCOUNTER — Other Ambulatory Visit: Payer: Self-pay | Admitting: Internal Medicine

## 2021-09-20 LAB — CUP PACEART REMOTE DEVICE CHECK
Battery Remaining Longevity: 99 mo
Battery Voltage: 3 V
Brady Statistic AP VP Percent: 10.55 %
Brady Statistic AP VS Percent: 0 %
Brady Statistic AS VP Percent: 89.39 %
Brady Statistic AS VS Percent: 0.06 %
Brady Statistic RA Percent Paced: 10.54 %
Brady Statistic RV Percent Paced: 99.94 %
Date Time Interrogation Session: 20221220222146
Implantable Lead Implant Date: 20200624
Implantable Lead Implant Date: 20200624
Implantable Lead Location: 753859
Implantable Lead Location: 753860
Implantable Lead Model: 5076
Implantable Lead Model: 5076
Implantable Pulse Generator Implant Date: 20200624
Lead Channel Impedance Value: 304 Ohm
Lead Channel Impedance Value: 304 Ohm
Lead Channel Impedance Value: 342 Ohm
Lead Channel Impedance Value: 361 Ohm
Lead Channel Pacing Threshold Amplitude: 0.375 V
Lead Channel Pacing Threshold Amplitude: 0.875 V
Lead Channel Pacing Threshold Pulse Width: 0.4 ms
Lead Channel Pacing Threshold Pulse Width: 0.4 ms
Lead Channel Sensing Intrinsic Amplitude: 12.625 mV
Lead Channel Sensing Intrinsic Amplitude: 12.625 mV
Lead Channel Sensing Intrinsic Amplitude: 2.75 mV
Lead Channel Sensing Intrinsic Amplitude: 2.75 mV
Lead Channel Setting Pacing Amplitude: 1.5 V
Lead Channel Setting Pacing Amplitude: 2.5 V
Lead Channel Setting Pacing Pulse Width: 0.4 ms
Lead Channel Setting Sensing Sensitivity: 2.8 mV

## 2021-09-21 ENCOUNTER — Ambulatory Visit (INDEPENDENT_AMBULATORY_CARE_PROVIDER_SITE_OTHER): Payer: Medicare Other

## 2021-09-21 DIAGNOSIS — I442 Atrioventricular block, complete: Secondary | ICD-10-CM | POA: Diagnosis not present

## 2021-09-29 ENCOUNTER — Other Ambulatory Visit: Payer: Self-pay | Admitting: Internal Medicine

## 2021-09-29 NOTE — Progress Notes (Signed)
Remote pacemaker transmission.   

## 2021-10-09 ENCOUNTER — Other Ambulatory Visit: Payer: Self-pay | Admitting: Internal Medicine

## 2021-10-10 NOTE — Telephone Encounter (Signed)
Pt has an appt on 10-16-2021

## 2021-10-12 MED ORDER — HYDROCHLOROTHIAZIDE 12.5 MG PO TABS: 12.5000 mg | ORAL_TABLET | Freq: Every day | ORAL | 0 refills | Status: DC

## 2021-10-12 NOTE — Addendum Note (Signed)
Addended by: Kathreen Devoid on: 10/12/2021 07:22 AM   Modules accepted: Orders

## 2021-10-16 ENCOUNTER — Encounter: Payer: Self-pay | Admitting: Internal Medicine

## 2021-10-16 ENCOUNTER — Ambulatory Visit (INDEPENDENT_AMBULATORY_CARE_PROVIDER_SITE_OTHER): Payer: Medicare Other | Admitting: Internal Medicine

## 2021-10-16 VITALS — BP 120/90 | HR 61 | Temp 97.6°F | Ht 65.5 in | Wt 133.0 lb

## 2021-10-16 DIAGNOSIS — R7989 Other specified abnormal findings of blood chemistry: Secondary | ICD-10-CM

## 2021-10-16 DIAGNOSIS — I442 Atrioventricular block, complete: Secondary | ICD-10-CM | POA: Diagnosis not present

## 2021-10-16 DIAGNOSIS — E0590 Thyrotoxicosis, unspecified without thyrotoxic crisis or storm: Secondary | ICD-10-CM

## 2021-10-16 DIAGNOSIS — Z23 Encounter for immunization: Secondary | ICD-10-CM | POA: Diagnosis not present

## 2021-10-16 DIAGNOSIS — R413 Other amnesia: Secondary | ICD-10-CM | POA: Diagnosis not present

## 2021-10-16 DIAGNOSIS — E785 Hyperlipidemia, unspecified: Secondary | ICD-10-CM

## 2021-10-16 DIAGNOSIS — I4891 Unspecified atrial fibrillation: Secondary | ICD-10-CM | POA: Diagnosis not present

## 2021-10-16 DIAGNOSIS — I10 Essential (primary) hypertension: Secondary | ICD-10-CM

## 2021-10-16 DIAGNOSIS — I429 Cardiomyopathy, unspecified: Secondary | ICD-10-CM

## 2021-10-16 LAB — CBC WITH DIFFERENTIAL/PLATELET
Basophils Absolute: 0 10*3/uL (ref 0.0–0.1)
Basophils Relative: 0.8 % (ref 0.0–3.0)
Eosinophils Absolute: 0.3 10*3/uL (ref 0.0–0.7)
Eosinophils Relative: 4.7 % (ref 0.0–5.0)
HCT: 41.5 % (ref 36.0–46.0)
Hemoglobin: 13.6 g/dL (ref 12.0–15.0)
Lymphocytes Relative: 27.6 % (ref 12.0–46.0)
Lymphs Abs: 1.5 10*3/uL (ref 0.7–4.0)
MCHC: 32.8 g/dL (ref 30.0–36.0)
MCV: 87.7 fl (ref 78.0–100.0)
Monocytes Absolute: 0.6 10*3/uL (ref 0.1–1.0)
Monocytes Relative: 11.4 % (ref 3.0–12.0)
Neutro Abs: 3 10*3/uL (ref 1.4–7.7)
Neutrophils Relative %: 55.5 % (ref 43.0–77.0)
Platelets: 165 10*3/uL (ref 150.0–400.0)
RBC: 4.73 Mil/uL (ref 3.87–5.11)
RDW: 14.1 % (ref 11.5–15.5)
WBC: 5.5 10*3/uL (ref 4.0–10.5)

## 2021-10-16 LAB — COMPREHENSIVE METABOLIC PANEL
ALT: 16 U/L (ref 0–35)
AST: 20 U/L (ref 0–37)
Albumin: 4.3 g/dL (ref 3.5–5.2)
Alkaline Phosphatase: 102 U/L (ref 39–117)
BUN: 22 mg/dL (ref 6–23)
CO2: 28 mEq/L (ref 19–32)
Calcium: 10.8 mg/dL — ABNORMAL HIGH (ref 8.4–10.5)
Chloride: 106 mEq/L (ref 96–112)
Creatinine, Ser: 0.86 mg/dL (ref 0.40–1.20)
GFR: 61.22 mL/min (ref >60.00)
Glucose, Bld: 98 mg/dL (ref 70–99)
Potassium: 4.7 mEq/L (ref 3.5–5.1)
Sodium: 141 mEq/L (ref 135–145)
Total Bilirubin: 0.6 mg/dL (ref 0.2–1.2)
Total Protein: 7.3 g/dL (ref 6.0–8.3)

## 2021-10-16 LAB — LIPID PANEL
Cholesterol: 156 mg/dL (ref 0–200)
HDL: 47.7 mg/dL (ref >39.00)
LDL Cholesterol: 73 mg/dL (ref 0–99)
NonHDL: 108.44
Total CHOL/HDL Ratio: 3
Triglycerides: 175 mg/dL — ABNORMAL HIGH (ref 0.0–149.0)
VLDL: 35 mg/dL (ref 0.0–40.0)

## 2021-10-16 LAB — VITAMIN B12: Vitamin B-12: 654 pg/mL (ref 211–911)

## 2021-10-16 LAB — T4, FREE: Free T4: 1 ng/dL (ref 0.60–1.60)

## 2021-10-16 LAB — TSH: TSH: 0.09 u[IU]/mL — ABNORMAL LOW (ref 0.35–5.50)

## 2021-10-16 LAB — T3, FREE: T3, Free: 3.8 pg/mL (ref 2.3–4.2)

## 2021-10-16 MED ORDER — HYDROCHLOROTHIAZIDE 12.5 MG PO TABS: 12.5000 mg | ORAL_TABLET | Freq: Every day | ORAL | 1 refills | Status: DC

## 2021-10-16 MED ORDER — ENALAPRIL MALEATE 10 MG PO TABS: 10.0000 mg | ORAL_TABLET | Freq: Every day | ORAL | 1 refills | Status: DC

## 2021-10-16 NOTE — Patient Instructions (Signed)
-  Nice seeing you today!!  -Lab work today; will notify you once results are available.  -Pneumonia vaccine today.

## 2021-10-16 NOTE — Progress Notes (Signed)
Established Patient Office Visit     This visit occurred during the SARS-CoV-2 public health emergency.  Safety protocols were in place, including screening questions prior to the visit, additional usage of staff PPE, and extensive cleaning of exam room while observing appropriate contact time as indicated for disinfecting solutions.    CC/Reason for Visit: Follow-up chronic conditions  HPI: Kathryn Strickland is a 88 y.o. female who is coming in today for the above mentioned reasons. Past Medical History is significant for: Hypertension that has been well controlled, atrial fibrillation anticoagulated on Eliquis, history of complete heart block status post permanent pacemaker.  She follows routinely with cardiology.  In the past 8 months she has had both hips replaced and has recovered remarkably well.  She continues to have some concerns about her short-term memory.  She is requesting a pneumonia vaccine but declines all other vaccinations despite counseling.  Her blood pressure is elevated today, however daughter notes that she has been out of her hydrochlorothiazide for about 2 weeks and just started again 4 days ago.   Past Medical/Surgical History: Past Medical History:  Diagnosis Date   Arthritis    Hips,Knees    Atrial fibrillation ([place]) 04/07/2019   Cardiomyopathy ([place]) 04/20/2019   EF 45% with apical WMA- no history of CAD or symptoms of angina   Carotid bruit    LATERAL UNSPECIFIED   Chronic pain    Complete heart block ([place]) 03/2019   CRI (chronic renal insufficiency), stage 3 (moderate) ([place]) 04/20/2019   Dementia (HCC)    Short term memory   Genital disorder, female    Hypercalcemia    Hypercholesterolemia    Hyperlipidemia    Hypertension    Osteoporosis    Postmenopausal    Presence of permanent cardiac pacemaker 03/25/2019   for complete heart block   Primary osteoarthritis of right hip 05/18/2019   Vitamin D deficiency     Past Surgical History:   Procedure Laterality Date   COLONOSCOPY     PACEMAKER IMPLANT N/A 03/25/2019   Procedure: PACEMAKER IMPLANT;  Surgeon: Constance Haw, MD;  Location: [facility];  Service: Cardiovascular;  Laterality: N/A;   TOTAL HIP ARTHROPLASTY Right 06/09/2019   Procedure: TOTAL HIP ARTHROPLASTY ANTERIOR APPROACH;  Surgeon: Renette Butters, MD;  Location[facility];  Service: Orthopedics;  Laterality: Right;   TOTAL HIP ARTHROPLASTY Left 10/20/2019   Procedure: TOTAL HIP ARTHROPLASTY ANTERIOR APPROACH;  Surgeon: Renette Butters, MD;  Location: [facility];  Service: Orthopedics;  Laterality: Left;    Social History:  reports that she has never smoked. She has never used smokeless tobacco. She reports that she does not drink alcohol and does not use drugs.  Allergies: No Known Allergies  Family History:  Family History  Problem Relation Age of Onset   Heart disease Father    CAD Father 22   Colon cancer Mother 11     Current Outpatient Medications:    Cholecalciferol (VITAMIN D-3) 25 MCG (1000 UT) CAPS, Take 1,000 Units by mouth daily with breakfast., Disp: , Rfl:    ELIQUIS 5 MG TABS tablet, TAKE 1 TABLET BY MOUTH TWICE A DAY, Disp: 60 tablet, Rfl: 5   enalapril (VASOTEC) 10 MG tablet, Take 1 tablet (10 mg total) by mouth daily., Disp: 90 tablet, Rfl: 2   hydrochlorothiazide (HYDRODIURIL) 12.5 MG tablet, Take 1 tablet (12.5 mg total) by mouth daily., Disp: 30 tablet, Rfl: 0  Review of Systems:  Constitutional: Denies fever, chills, diaphoresis, appetite change and fatigue.  HEENT: Denies photophobia, eye pain, redness, hearing loss, ear pain, congestion, sore throat, rhinorrhea, sneezing, mouth sores, trouble swallowing, neck pain, neck stiffness and tinnitus.   Respiratory: Denies SOB, DOE, cough, chest tightness,  and wheezing.   Cardiovascular: Denies chest pain, palpitations and leg swelling.  Gastrointestinal: Denies nausea, vomiting, abdominal pain, diarrhea, constipation, blood  in stool and abdominal distention.  Genitourinary: Denies dysuria, urgency, frequency, hematuria, flank pain and difficulty urinating.  Endocrine: Denies: hot or cold intolerance, sweats, changes in hair or nails, polyuria, polydipsia. Musculoskeletal: Denies myalgias, back pain, joint swelling, arthralgias and gait problem.  Skin: Denies pallor, rash and wound.  Neurological: Denies dizziness, seizures, syncope, weakness, light-headedness, numbness and headaches.  Hematological: Denies adenopathy. Easy bruising, personal or family bleeding history  Psychiatric/Behavioral: Denies suicidal ideation, mood changes, confusion, nervousness, sleep disturbance and agitation    Physical Exam: Vitals:   10/16/21 1330  BP: 120/90  Pulse: 61  Temp: 97.6 F (36.4 C)  TempSrc: Oral  SpO2: 98%  Weight: 133 lb (60.3 kg)  Height: 5' 5.5" (1.664 m)    Body mass index is 21.8 kg/m.   Constitutional: NAD, calm, comfortable Eyes: PERRL, lids and conjunctivae normal, wears corrective lenses ENMT: Mucous membranes are moist.  Respiratory: clear to auscultation bilaterally, no wheezing, no crackles. Normal respiratory effort. No accessory muscle use.  Cardiovascular: Regular rate and rhythm, no murmurs / rubs / gallops. No extremity edema.  Neurologic: Grossly intact and nonfocal Psychiatric: Normal judgment and insight. Alert and oriented x 3. Normal mood.    Impression and Plan:  Need for pneumococcal vaccination  - Plan: Pneumococcal conjugate vaccine 20-valent (Prevnar 20)  Primary hypertension -Blood pressure remains elevated, however she had been out of her medication for 2 weeks and just resumed a few days ago.  Continue to follow.  Atrial fibrillation, unspecified type ([place]) - Plan: Comprehensive metabolic panel, CBC with Differential/Platelet  Complete heart block ([place]) -Status post permanent pacemaker, followed by EP cardiology  Need for vaccination against Streptococcus  pneumoniae -PCV 20 administered today.  Memory loss, short term  - Plan: Vitamin B12  Subclinical hyperthyroidism  - Plan: T4, free, T3, free, TSH  Hyperlipidemia, unspecified hyperlipidemia type  - Plan: Lipid panel  Time spent: 32 minutes reviewing chart, interviewing and examining patient and formulating plan of care.   Patient Instructions  -Nice seeing you today!!  -Lab work today; will notify you once results are available.  -Pneumonia vaccine today.    Lelon Frohlich, MD [facility]

## 2021-10-17 ENCOUNTER — Other Ambulatory Visit: Payer: Self-pay | Admitting: Internal Medicine

## 2021-10-17 DIAGNOSIS — E0590 Thyrotoxicosis, unspecified without thyrotoxic crisis or storm: Secondary | ICD-10-CM

## 2021-10-20 ENCOUNTER — Other Ambulatory Visit: Payer: Self-pay

## 2021-10-20 DIAGNOSIS — E0590 Thyrotoxicosis, unspecified without thyrotoxic crisis or storm: Secondary | ICD-10-CM

## 2021-10-20 DIAGNOSIS — R7989 Other specified abnormal findings of blood chemistry: Secondary | ICD-10-CM

## 2021-10-25 ENCOUNTER — Other Ambulatory Visit (INDEPENDENT_AMBULATORY_CARE_PROVIDER_SITE_OTHER): Payer: Medicare Other

## 2021-10-27 LAB — PTH, INTACT AND CALCIUM
Calcium: 10.7 mg/dL — ABNORMAL HIGH (ref 8.6–10.4)
PTH: 49 pg/mL (ref 16–77)

## 2021-10-31 ENCOUNTER — Other Ambulatory Visit: Payer: Self-pay

## 2021-10-31 DIAGNOSIS — M160 Bilateral primary osteoarthritis of hip: Secondary | ICD-10-CM

## 2021-10-31 DIAGNOSIS — E210 Primary hyperparathyroidism: Secondary | ICD-10-CM

## 2021-11-02 ENCOUNTER — Other Ambulatory Visit: Payer: Self-pay | Admitting: Internal Medicine

## 2021-11-02 DIAGNOSIS — M1611 Unilateral primary osteoarthritis, right hip: Secondary | ICD-10-CM

## 2021-11-02 DIAGNOSIS — M160 Bilateral primary osteoarthritis of hip: Secondary | ICD-10-CM

## 2021-11-02 DIAGNOSIS — M1612 Unilateral primary osteoarthritis, left hip: Secondary | ICD-10-CM

## 2021-11-02 DIAGNOSIS — Z780 Asymptomatic menopausal state: Secondary | ICD-10-CM

## 2021-11-13 ENCOUNTER — Encounter: Payer: Self-pay | Admitting: Endocrinology

## 2021-11-13 ENCOUNTER — Ambulatory Visit (INDEPENDENT_AMBULATORY_CARE_PROVIDER_SITE_OTHER): Payer: Medicare Other | Admitting: Endocrinology

## 2021-11-13 ENCOUNTER — Other Ambulatory Visit: Payer: Self-pay

## 2021-11-13 VITALS — BP 130/88 | HR 55 | Ht 65.5 in | Wt 130.6 lb

## 2021-11-13 DIAGNOSIS — R7989 Other specified abnormal findings of blood chemistry: Secondary | ICD-10-CM

## 2021-11-13 MED ORDER — METHIMAZOLE 5 MG PO TABS: 5.0000 mg | ORAL_TABLET | Freq: Every day | ORAL | 3 refills | Status: DC

## 2021-11-13 NOTE — Progress Notes (Signed)
Subjective:    Patient ID: Kathryn Strickland, female    DOB: 11-13-1932, 88 y.o.   MRN: [id number]  HPI Some hx is from dtr-in-law, due to pt's memory loss.  Pt is referred by Dr Doreatha Lew, for hyperthyroidism.  Pt reports she was dx'ed with hyperthyroidism in.  she has never been on therapy for this.  she has never had XRT to the anterior neck, or thyroid surgery.  she has never had thyroid imaging.  she does not consume non-prescribed thyroid medication.  she has never been on amiodarone.  Pt has anxiety.  She has a pacemaker.   Past Medical History:  Diagnosis Date   Arthritis    Hips,Knees    Atrial fibrillation ([place]) 04/07/2019   Cardiomyopathy ([place]) 04/20/2019   EF 45% with apical WMA- no history of CAD or symptoms of angina   Carotid bruit    LATERAL UNSPECIFIED   Chronic pain    Complete heart block ([place]) 03/2019   CRI (chronic renal insufficiency), stage 3 (moderate) ([place]) 04/20/2019   Dementia (HCC)    Short term memory   Genital disorder, female    Hypercalcemia    Hypercholesterolemia    Hyperlipidemia    Hypertension    Osteoporosis    Postmenopausal    Presence of permanent cardiac pacemaker 03/25/2019   for complete heart block   Primary osteoarthritis of right hip 05/18/2019   Vitamin D deficiency     Past Surgical History:  Procedure Laterality Date   COLONOSCOPY     PACEMAKER IMPLANT N/A 03/25/2019   Procedure: PACEMAKER IMPLANT;  Surgeon: Constance Haw, MD;  Location: [facility];  Service: Cardiovascular;  Laterality: N/A;   TOTAL HIP ARTHROPLASTY Right 06/09/2019   Procedure: TOTAL HIP ARTHROPLASTY ANTERIOR APPROACH;  Surgeon: Renette Butters, MD;  [facility];  Service: Orthopedics;  Laterality: Right;   TOTAL HIP ARTHROPLASTY Left 10/20/2019   Procedure: TOTAL HIP ARTHROPLASTY ANTERIOR APPROACH;  Surgeon: Renette Butters, MD;  [facility];  Service: Orthopedics;  Laterality: Left;    Social History   Socioeconomic History    Marital status: Widowed    Spouse name: Not on file   Number of children: Not on file   Years of education: Not on file   Highest education level: Bachelor's degree (e.g., BA, AB, BS)  Occupational History   Not on file  Tobacco Use   Smoking status: Never   Smokeless tobacco: Never  Vaping Use   Vaping Use: Never used  Substance and Sexual Activity   Alcohol use: Never   Drug use: Never   Sexual activity: Not on file  Other Topics Concern   Not on file  Social History Narrative   Not on file   Social Determinants of Health   Financial Resource Strain: Low Risk    Difficulty of Paying Living Expenses: Not hard at all  Food Insecurity: No Food Insecurity   Worried About Charity fundraiser in the Last Year: Never true   [place] in the Last Year: Never true  Transportation Needs: No Transportation Needs   Lack of Transportation (Medical): No   Lack of Transportation (Non-Medical): No  Physical Activity: Insufficiently Active   Days of Exercise per Week: 2 days   Minutes of Exercise per Session: 10 min  Stress: No Stress Concern Present   Feeling of Stress : Not at all  Social Connections: Moderately Isolated   Frequency of Communication with Friends  and Family: Twice a week   Frequency of Social Gatherings with Friends and Family: Twice a week   Attends Religious Services: 1 to 4 times per year   Active Member of [employer] or Organizations: No   Attends Archivist Meetings: Not on file   Marital Status: Widowed  Human resources officer Violence: Not on file    Current Outpatient Medications on File Prior to Visit  Medication Sig Dispense Refill   ELIQUIS 5 MG TABS tablet TAKE 1 TABLET BY MOUTH TWICE A DAY 60 tablet 5   enalapril (VASOTEC) 10 MG tablet Take 1 tablet (10 mg total) by mouth daily. 90 tablet 1   hydrochlorothiazide (HYDRODIURIL) 12.5 MG tablet Take 1 tablet (12.5 mg total) by mouth daily. 90 tablet 1   Cholecalciferol (VITAMIN D-3) 25 MCG (1000 UT)  CAPS Take 1,000 Units by mouth daily with breakfast.     No current facility-administered medications on file prior to visit.    No Known Allergies  Family History  Problem Relation Age of Onset   Colon cancer Mother 42   Heart disease Father    CAD Father 24   Thyroid disease Neg Hx     BP 130/88 (BP Location: Left Arm, Patient Position: Sitting, Cuff Size: Normal)    Pulse (!) 55    Ht 5' 5.5" (1.664 m)    Wt 130 lb 9.6 oz (59.2 kg)    SpO2 100%    BMI 21.40 kg/m   Review of Systems denies weight loss, palpitations, sob, excessive diaphoresis, tremor, and heat intolerance.      Objective:   Physical Exam VS: see vs page GEN: no distress HEAD: head: no deformity eyes: no periorbital swelling, no proptosis external nose and ears are normal NECK: thyroid is slightly enlarged, but I can't tell details.   CHEST WALL: no deformity LUNGS: clear to auscultation CV: reg rate and rhythm, no murmur.  MUSCULOSKELETAL: gait is normal and steady EXTEMITIES: no deformity.  no leg edema NEURO:  readily moves all 4's.  sensation is intact to touch on all 4's.  No tremor SKIN:  Normal texture and temperature.  No rash or suspicious lesion is visible.  Not diaphoretic NODES:  None palpable at the neck.   PSYCH: alert, well-oriented.  Does not appear anxious nor depressed.     Lab Results  Component Value Date   TSH 0.09 Repeated and verified X2. (L) 10/16/2021     Assessment & Plan:  Hyperthyroidism: uncontrolled.  uncertain etiology and prognosis.    Patient Instructions  I have sent a prescription to your pharmacy, to slow the thyroid. If ever you have fever while taking methimazole, stop it and call us, even if the reason is obvious, because of the risk of a rare side-effect. It is best to never miss the medication.  However, if you do miss it, next best is to double up the next time. Please come back for a follow-up appointment in 3-4 weeks

## 2021-11-13 NOTE — Patient Instructions (Addendum)
I have sent a prescription to your pharmacy, to slow the thyroid If ever you have fever while taking methimazole, stop it and call us, even if the reason is obvious, because of the risk of a rare side-effect. It is best to never miss the medication.  However, if you do miss it, next best is to double up the next time. Please come back for a follow-up appointment in 3-4 weeks.   

## 2021-12-11 ENCOUNTER — Other Ambulatory Visit: Payer: Self-pay

## 2021-12-11 ENCOUNTER — Encounter: Payer: Self-pay | Admitting: Endocrinology

## 2021-12-11 ENCOUNTER — Ambulatory Visit (INDEPENDENT_AMBULATORY_CARE_PROVIDER_SITE_OTHER): Payer: Medicare Other | Admitting: Endocrinology

## 2021-12-11 VITALS — BP 138/90 | HR 69 | Ht 65.5 in | Wt 129.6 lb

## 2021-12-11 DIAGNOSIS — E0590 Thyrotoxicosis, unspecified without thyrotoxic crisis or storm: Secondary | ICD-10-CM | POA: Diagnosis not present

## 2021-12-11 DIAGNOSIS — R7989 Other specified abnormal findings of blood chemistry: Secondary | ICD-10-CM

## 2021-12-11 LAB — TSH: TSH: 0.15 u[IU]/mL — ABNORMAL LOW (ref 0.35–5.50)

## 2021-12-11 LAB — T4, FREE: Free T4: 0.85 ng/dL (ref 0.60–1.60)

## 2021-12-11 NOTE — Patient Instructions (Addendum)
Blood tests are requested for you today.  We'll let you know about the results.  If ever you have fever while taking methimazole, stop it and call us, even if the reason is obvious, because of the risk of a rare side-effect. It is best to never miss the medication.  However, if you do miss it, next best is to double up the next time.   Please come back for a follow-up appointment in 2 months.  

## 2021-12-11 NOTE — Progress Notes (Unsigned)
° °  Subjective:    Patient ID: Kathryn Strickland, female    DOB: 12-09-1932, 88 y.o.   MRN: [id number]  HPI  Pt is referred by Dr Feliz Beam, for hyperthyroidism.  Pt reports she was dx'ed with hyperthyroidism in.  she has never been on therapy for this.  she has never had XRT to the anterior neck, or thyroid surgery.  she has never had thyroid imaging.  she does not consume non-prescribed thyroid medication.  she has never been on amiodarone.  Pt has anxiety.  She has a pacemaker.    Since on tapazole, pt states she feels no different, and well in general.    Review of Systems Denies fever    Objective:   Physical Exam VITAL SIGNS:  See vs page GENERAL: no distress NECK: There is no palpable thyroid enlargement.  No thyroid nodule is palpable.  No palpable lymphadenopathy at the anterior neck.        Assessment & Plan:

## 2021-12-21 ENCOUNTER — Ambulatory Visit (INDEPENDENT_AMBULATORY_CARE_PROVIDER_SITE_OTHER): Payer: Medicare Other

## 2021-12-21 DIAGNOSIS — I442 Atrioventricular block, complete: Secondary | ICD-10-CM

## 2021-12-21 LAB — CUP PACEART REMOTE DEVICE CHECK
Battery Remaining Longevity: 96 mo
Battery Voltage: 3 V
Brady Statistic AP VP Percent: 3.43 %
Brady Statistic AP VS Percent: 0 %
Brady Statistic AS VP Percent: 96.55 %
Brady Statistic AS VS Percent: 0.02 %
Brady Statistic RA Percent Paced: 3.41 %
Brady Statistic RV Percent Paced: 99.98 %
Date Time Interrogation Session: 20230320205740
Implantable Lead Implant Date: 20200624
Implantable Lead Implant Date: 20200624
Implantable Lead Location: 753859
Implantable Lead Location: 753860
Implantable Lead Model: 5076
Implantable Lead Model: 5076
Implantable Pulse Generator Implant Date: 20200624
Lead Channel Impedance Value: 304 Ohm
Lead Channel Impedance Value: 304 Ohm
Lead Channel Impedance Value: 342 Ohm
Lead Channel Impedance Value: 361 Ohm
Lead Channel Pacing Threshold Amplitude: 0.5 V
Lead Channel Pacing Threshold Amplitude: 0.875 V
Lead Channel Pacing Threshold Pulse Width: 0.4 ms
Lead Channel Pacing Threshold Pulse Width: 0.4 ms
Lead Channel Sensing Intrinsic Amplitude: 12.625 mV
Lead Channel Sensing Intrinsic Amplitude: 12.625 mV
Lead Channel Sensing Intrinsic Amplitude: 2.625 mV
Lead Channel Sensing Intrinsic Amplitude: 2.625 mV
Lead Channel Setting Pacing Amplitude: 1.5 V
Lead Channel Setting Pacing Amplitude: 2.5 V
Lead Channel Setting Pacing Pulse Width: 0.4 ms
Lead Channel Setting Sensing Sensitivity: 2.8 mV

## 2021-12-28 NOTE — Progress Notes (Signed)
Remote pacemaker transmission.   

## 2021-12-29 ENCOUNTER — Other Ambulatory Visit: Payer: Self-pay | Admitting: Cardiology

## 2021-12-29 DIAGNOSIS — I4891 Unspecified atrial fibrillation: Secondary | ICD-10-CM

## 2021-12-29 NOTE — Telephone Encounter (Signed)
[name] son called back. He stated that he takes care of pt's meds/appointments. Informed him that pt qualifies for a dose decrease of Eliquis. Pt will have a new prescription for Eliquis 2.5 mg BID sent in to the pharmacy. Also, made him aware pt needs to schedule an appointment to see cardiologist. [name] son stated that he would call back to make an appointment.  ?

## 2021-12-29 NOTE — Telephone Encounter (Addendum)
Prescription refill request for Eliquis received. ? ?Indication: afib  ?Last office visit: 10/27/2020, [place] ?Scr: 0.86, 10/16/2021 ?Age: 88 yo  ?Weight: 58.8 kg  ? ? ?Pt is overdue to see cardiologist. ? ? Pt also qualifies for a dose decrease of Eliquis. Spoke to [employer] D. Okay to decrease pt's Eliquis dose to 2.5mg  BID.  ? ?Called pt and LMOM to call anticoag clinic back at [phone]. When she calls back need to let her know of Eliquis dose decrease and transfer her to main line to schedule an appointment with cardiologist.  ?

## 2022-01-23 ENCOUNTER — Other Ambulatory Visit: Payer: Self-pay | Admitting: Cardiology

## 2022-01-23 DIAGNOSIS — I4891 Unspecified atrial fibrillation: Secondary | ICD-10-CM

## 2022-01-23 NOTE — Telephone Encounter (Signed)
Prescription refill request for Eliquis received. ?Indication: afib ?Last office visit: pt is overdue but has phy pacer check on 6/1 scheduled ?Scr: 0.86 ?Age: 88 ?Weight: 58.8 ? ?

## 2022-02-12 ENCOUNTER — Ambulatory Visit: Payer: Medicare Other | Admitting: Endocrinology

## 2022-02-27 ENCOUNTER — Encounter: Payer: Self-pay | Admitting: Endocrinology

## 2022-02-27 ENCOUNTER — Ambulatory Visit (INDEPENDENT_AMBULATORY_CARE_PROVIDER_SITE_OTHER): Payer: Medicare Other | Admitting: Endocrinology

## 2022-02-27 VITALS — BP 118/68 | HR 68 | Ht 65.25 in | Wt 128.4 lb

## 2022-02-27 DIAGNOSIS — E042 Nontoxic multinodular goiter: Secondary | ICD-10-CM

## 2022-02-27 DIAGNOSIS — E0590 Thyrotoxicosis, unspecified without thyrotoxic crisis or storm: Secondary | ICD-10-CM

## 2022-02-27 LAB — T4, FREE: Free T4: 0.8 ng/dL (ref 0.60–1.60)

## 2022-02-27 LAB — TSH: TSH: 1.76 u[IU]/mL (ref 0.35–5.50)

## 2022-02-27 NOTE — Patient Instructions (Signed)
Stop Methimazole 

## 2022-02-27 NOTE — Progress Notes (Signed)
Patient ID: Kathryn Strickland, female   DOB: 07-18-1933, 88 y.o.   MRN: [id number]                                                                                                               Reason for Appointment:  Hyperthyroidism, follow-up visit   Referring healthcare provider: Dr. Ardyth Harps   Chief complaint:  Follow-up Her history is primarily being given by her daughter-in-law   History of Present Illness:   She had a routine TSH done in 07/2020 which was low normal at 0.35 but subsequently normal Her PCP did a follow-up thyroid panel in 1/23 although she was not having any symptoms at that time She did not have any unusual weight loss, palpitations, heat intolerance or shakiness At this time her TSH was 0.09 with upper normal T3 but not abnormal T4  She does have a long history of atrial fibrillation For this reason she was likely started on methimazole 5 mg daily in 2/23 No other evaluation was done at that time  She did not feel any different with starting the methimazole Recently also not feeling unusually hot or cold, no significant weight change, stable appetite and no recent issues with palpitations  Last thyroid function panel was in 3/23 as follows  Wt Readings from Last 3 Encounters:  02/27/22 128 lb 6.4 oz (58.2 kg)  12/11/21 129 lb 9.6 oz (58.8 kg)  11/13/21 130 lb 9.6 oz (59.2 kg)    Thyroid function tests as follows:     Lab Results  Component Value Date   FREET4 0.85 12/11/2021   FREET4 1.00 10/16/2021   FREET4 1.1 08/03/2020   T3FREE 3.8 10/16/2021   T3FREE 3.5 08/03/2020   TSH 0.15 (L) 12/11/2021   TSH 0.09 Repeated and verified X2. (L) 10/16/2021   TSH 0.53 08/03/2020    No results found for: THYROTRECAB   Allergies as of 02/27/2022   No Known Allergies      Medication List        Accurate as of Feb 27, 2022  1:05 PM. If you have any questions, ask your nurse or doctor.          apixaban 2.5 MG Tabs tablet Commonly  known as: Eliquis Take 1 tablet (2.5 mg total) by mouth 2 (two) times daily.   enalapril 10 MG tablet Commonly known as: VASOTEC Take 1 tablet (10 mg total) by mouth daily.   hydrochlorothiazide 12.5 MG tablet Commonly known as: HYDRODIURIL Take 1 tablet (12.5 mg total) by mouth daily.   methimazole 5 MG tablet Commonly known as: TAPAZOLE Take 1 tablet (5 mg total) by mouth daily.   VITAMIN B COMPLEX PO Take by mouth.   Vitamin D-3 25 MCG (1000 UT) Caps Take 1,000 Units by mouth daily with breakfast.            Past Medical History:  Diagnosis Date   Arthritis    Hips,Knees    Atrial fibrillation (HCC) 04/07/2019   Cardiomyopathy (HCC) 04/20/2019   EF  45% with apical WMA- no history of CAD or symptoms of angina   Carotid bruit    LATERAL UNSPECIFIED   Chronic pain    Complete heart block ([place]) 03/2019   CRI (chronic renal insufficiency), stage 3 (moderate) ([place]) 04/20/2019   Dementia (HCC)    Short term memory   Genital disorder, female    Hypercalcemia    Hypercholesterolemia    Hyperlipidemia    Hypertension    Osteoporosis    Postmenopausal    Presence of permanent cardiac pacemaker 03/25/2019   for complete heart block   Primary osteoarthritis of right hip 05/18/2019   Vitamin D deficiency     Past Surgical History:  Procedure Laterality Date   COLONOSCOPY     PACEMAKER IMPLANT N/A 03/25/2019   Procedure: PACEMAKER IMPLANT;  Surgeon: Constance Haw, MD;  Location: [place] [facility];  Service: Cardiovascular;  Laterality: N/A;   TOTAL HIP ARTHROPLASTY Right 06/09/2019   Procedure: TOTAL HIP ARTHROPLASTY ANTERIOR APPROACH;  Surgeon: Renette Butters, MD;  [facility];  Service: Orthopedics;  Laterality: Right;   TOTAL HIP ARTHROPLASTY Left 10/20/2019   Procedure: TOTAL HIP ARTHROPLASTY ANTERIOR APPROACH;  Surgeon: Renette Butters, MD;  [facility];  Service: Orthopedics;  Laterality: Left;    Family History  Problem Relation Age of Onset    Colon cancer Mother 48   Heart disease Father    CAD Father 16   Thyroid disease Neg Hx     Social History:  reports that she has never smoked. She has never used smokeless tobacco. She reports that she does not drink alcohol and does not use drugs.  Allergies: No Known Allergies   Review of Systems  She has had atrial fibrillation since 2019, not on beta-blockers  No recent renal issues, previously had abnormal kidney function in 2020  Lab Results  Component Value Date   CREATININE 0.86 10/16/2021   CREATININE 1.07 (H) 07/22/2020   CREATININE 0.92 10/13/2019   No history of diabetes Is on Vasotec for hypertension HCTZ  She does have memory loss   No reported difficulty swallowing   Examination:   BP 118/68   Pulse 68   Ht 5' 5.25" (1.657 m)   Wt 128 lb 6.4 oz (58.2 kg)   SpO2 99%   BMI 21.20 kg/m    General Appearance:  well-built and nourished, pleasant, not anxious or hyperkinetic.         Eyes: No abnormal prominence, stare present.  No swelling of the eyelids   Neck: The thyroid is enlarged about 2-1/2 times normal and feels nodular, smooth on the right side and isthmus Left side not palpable  Neurological:  No fine tremors are present. Deep tendon reflexes at biceps appear normal  No peripheral edema   Assessment/Plan:  Subclinical Hyperthyroidism, Likely to be from either toxic nodular goiter or hot nodule  Although her TSH was in the hyperthyroid range free T4 and free T3 were normal except high normal T3 Given her asymptomatic status at baseline and her multinodular goiter on exam she likely will benefit from I-131 treatment if she has abnormal scan and uptake This was ordered and discussed with her daughter-in-law the process of doing I-131 At this time she will stop her methimazole  Thyroid function will be rechecked today  Patient handout on hyperthyroidism and radioactive iodine treatment given Patient understands the above discussion  and treatment options. All questions were answered satisfactorily  Total visit time for evaluation  and management, counseling as well as detailed review of records = 30 minutes  Elayne Snare 02/27/2022, 1:05 PM    Note: This office note was prepared with Dragon voice recognition system technology. Any transcriptional errors that result from this process are unintentional.   Addendum: TSH normalized at 1.76 with stable T4 of 0.8  Elayne Snare

## 2022-03-01 ENCOUNTER — Ambulatory Visit (INDEPENDENT_AMBULATORY_CARE_PROVIDER_SITE_OTHER): Payer: Medicare Other | Admitting: Cardiology

## 2022-03-01 ENCOUNTER — Encounter: Payer: Self-pay | Admitting: Cardiology

## 2022-03-01 VITALS — BP 148/90 | HR 61 | Ht 65.0 in | Wt 128.8 lb

## 2022-03-01 DIAGNOSIS — I442 Atrioventricular block, complete: Secondary | ICD-10-CM

## 2022-03-01 DIAGNOSIS — I480 Paroxysmal atrial fibrillation: Secondary | ICD-10-CM | POA: Diagnosis not present

## 2022-03-01 DIAGNOSIS — D6869 Other thrombophilia: Secondary | ICD-10-CM | POA: Diagnosis not present

## 2022-03-01 NOTE — Progress Notes (Signed)
Electrophysiology Office Note   Date:  03/01/2022   ID:  Kathryn Strickland, DOB 01/10/1933, MRN [id number]  PCP:  Isaac Bliss, Rayford Halsted, MD  Cardiologist:  Buren Kos  Primary Electrophysiologist:  Mearle Drew Meredith Leeds, MD    Chief Complaint: complete heart block   History of Present Illness: Kathryn Strickland is a 88 y.o. female who is being seen today for the evaluation of pacemkaer at the request of Isaac Bliss, [place] [facility]*. Presenting today for electrophysiology evaluation.  She has a history significant for hypertension, hyperlipidemia, complete heart block.  She is status post Medtronic dual-chamber pacemaker implanted 03/25/2019.  Today, denies symptoms of palpitations, chest pain, shortness of breath, orthopnea, PND, lower extremity edema, claudication, dizziness, presyncope, syncope, bleeding, or neurologic sequela. The patient is tolerating medications without difficulties.  Since being seen she has done well.  She has no major complaints.  She is able to do all of her daily activities without restriction.  She was initially on methimazole for elevated thyroid, but she saw an endocrinologist who took her off the medication.  Aside from that she is doing well without complaint.   Past Medical History:  Diagnosis Date   Arthritis    Hips,Knees    Atrial fibrillation ([place]) 04/07/2019   Cardiomyopathy ([place]) 04/20/2019   EF 45% with apical WMA- no history of CAD or symptoms of angina   Carotid bruit    LATERAL UNSPECIFIED   Chronic pain    Complete heart block ([place]) 03/2019   CRI (chronic renal insufficiency), stage 3 (moderate) ([place]) 04/20/2019   Dementia (HCC)    Short term memory   Genital disorder, female    Hypercalcemia    Hypercholesterolemia    Hyperlipidemia    Hypertension    Osteoporosis    Postmenopausal    Presence of permanent cardiac pacemaker 03/25/2019   for complete heart block   Primary osteoarthritis of right hip 05/18/2019   Vitamin D  deficiency    Past Surgical History:  Procedure Laterality Date   COLONOSCOPY     PACEMAKER IMPLANT N/A 03/25/2019   Procedure: PACEMAKER IMPLANT;  Surgeon: Constance Haw, MD;  Location: [facility];  Service: Cardiovascular;  Laterality: N/A;   TOTAL HIP ARTHROPLASTY Right 06/09/2019   Procedure: TOTAL HIP ARTHROPLASTY ANTERIOR APPROACH;  Surgeon: Renette Butters, MD;  Location: [facility];  Service: Orthopedics;  Laterality: Right;   TOTAL HIP ARTHROPLASTY Left 10/20/2019   Procedure: TOTAL HIP ARTHROPLASTY ANTERIOR APPROACH;  Surgeon: Renette Butters, MD;  Location: [facility];  Service: Orthopedics;  Laterality: Left;     Current Outpatient Medications  Medication Sig Dispense Refill   apixaban (ELIQUIS) 2.5 MG TABS tablet Take 1 tablet (2.5 mg total) by mouth 2 (two) times daily. 60 tablet 1   B Complex Vitamins (VITAMIN B COMPLEX PO) Take 1 tablet by mouth daily.     enalapril (VASOTEC) 10 MG tablet Take 1 tablet (10 mg total) by mouth daily. 90 tablet 1   hydrochlorothiazide (HYDRODIURIL) 12.5 MG tablet Take 1 tablet (12.5 mg total) by mouth daily. 90 tablet 1   methimazole (TAPAZOLE) 5 MG tablet Take 1 tablet (5 mg total) by mouth daily. 90 tablet 3   No current facility-administered medications for this visit.    Allergies:   Patient has no known allergies.   Social History:  The patient  reports that she has never smoked. She has never used smokeless tobacco. She reports that she does not drink alcohol and  does not use drugs.   Family History:  The patient's family history includes CAD (age of onset: 7) in her father; Colon cancer (age of onset: 57) in her mother; Heart disease in her father.    ROS:  Please see the history of present illness.   Otherwise, review of systems is positive for none.   All other systems are reviewed and negative.   PHYSICAL EXAM: VS:  BP (!) 148/90   Pulse 61   Ht 5\' 5"  (1.651 m)   Wt 128 lb 12.8 oz (58.4 kg)   SpO2 96%   BMI 21.43  kg/m  , BMI Body mass index is 21.43 kg/m. GEN: Well nourished, well developed, in no acute distress  HEENT: normal  Neck: no JVD, carotid bruits, or masses Cardiac: RRR; no murmurs, rubs, or gallops,no edema  Respiratory:  clear to auscultation bilaterally, normal work of breathing GI: soft, nontender, nondistended, + BS MS: no deformity or atrophy  Skin: warm and dry, device site well healed Neuro:  Strength and sensation are intact Psych: euthymic mood, full affect  EKG:  EKG is ordered today. Personal review of the ekg ordered shows sinus rhythm, V paced  Personal review of the device interrogation today. Results in [place]: 10/16/2021: ALT 16; BUN 22; Creatinine, Ser 0.86; Hemoglobin 13.6; Platelets 165.0; Potassium 4.7; Sodium 141 02/27/2022: TSH 1.76    Lipid Panel     Component Value Date/Time   CHOL 156 10/16/2021 1406   TRIG 175.0 (H) 10/16/2021 1406   HDL 47.70 10/16/2021 1406   CHOLHDL 3 10/16/2021 1406   VLDL 35.0 10/16/2021 1406   LDLCALC 73 10/16/2021 1406   LDLCALC 93 07/22/2020 0935     Wt Readings from Last 3 Encounters:  03/01/22 128 lb 12.8 oz (58.4 kg)  02/27/22 128 lb 6.4 oz (58.2 kg)  12/11/21 129 lb 9.6 oz (58.8 kg)      Other studies Reviewed: Additional studies/ records that were reviewed today include: TTE 05/05/19  Review of the above records today demonstrates:   1. The left ventricle has normal systolic function with an ejection fraction of 60-65%. The cavity size was normal. Left ventricular diastolic Doppler parameters are consistent with pseudonormalization.  2. The right ventricle has normal systolic function. The cavity was normal. There is no increase in right ventricular wall thickness.  3. Tricuspid valve regurgitation is moderate.  4. The aortic valve is abnormal. Mild thickening of the aortic valve. No stenosis of the aortic valve.  5. The aorta is normal in size and structure.  6. The aortic root and ascending  aorta are normal in size and structure.  7. Grossly normal.   ASSESSMENT AND PLAN:  1.  Complete heart block: Status post Medtronic dual-chamber pacemaker implanted 03/25/2019.  Device functioning appropriately.  No changes at this time.  2.  Hypertension: Dated today.  Was normal yesterday at her primary physician's office.  No changes.  3.  Paroxysmal atrial fibrillation: Minimally symptomatic.  Currently on Eliquis.  CHA2DS2-VASc of 4.  No changes.  4.  Secondary hypercoag state: Currently on Eliquis for atrial fibrillation as above.  Current medicines are reviewed at length with the patient today.   The patient does not have concerns regarding her medicines.  The following changes were made today: None  Labs/ tests ordered today include:  Orders Placed This Encounter  Procedures   EKG 12-Lead     Disposition:   FU 12 months  Signed, Jaeda Bruso Hassell Done  Curt Bears, MD  03/01/2022 4:41 PM     [place] [address] [phone] (office) [phone] (fax)

## 2022-03-01 NOTE — Patient Instructions (Signed)
Medication Instructions:  Your physician recommends that you continue on your current medications as directed. Please refer to the Current Medication list given to you today.  *If you need a refill on your cardiac medications before your next appointment, please call your pharmacy*   Lab Work: None ordered   Testing/Procedures: None ordered   Follow-Up: At [facility], you and your health needs are our priority.  As part of our continuing mission to provide you with exceptional heart care, we have created designated Provider Care Teams.  These Care Teams include your primary Cardiologist (physician) and Advanced Practice Providers (APPs -  Physician Assistants and Nurse Practitioners) who all work together to provide you with the care you need, when you need it.  Remote monitoring is used to monitor your Pacemaker or ICD from home. This monitoring reduces the number of office visits required to check your device to one time per year. It allows [place] to keep an eye on the functioning of your device to ensure it is working properly. You are scheduled for a device check from home on 03/22/22. You may send your transmission at any time that day. If you have a wireless device, the transmission will be sent automatically. After your physician reviews your transmission, you will receive a postcard with your next transmission date.  Your next appointment:   1 year(s)  The format for your next appointment:   In Person  Provider:   You will see one of the following Advanced Practice Providers on your designated Care Team:   Tommye Standard, [place] Legrand Como "Jonni Sanger" Chalmers Cater, [place]    Thank you for choosing [facility]!!   Trinidad Curet, RN [phone]    Other Instructions  Important Information About [place]

## 2022-03-21 LAB — CUP PACEART REMOTE DEVICE CHECK
Battery Remaining Longevity: 93 mo
Battery Voltage: 2.99 V
Brady Statistic AP VP Percent: 17.99 %
Brady Statistic AP VS Percent: 0 %
Brady Statistic AS VP Percent: 81.99 %
Brady Statistic AS VS Percent: 0.02 %
Brady Statistic RA Percent Paced: 17.95 %
Brady Statistic RV Percent Paced: 99.98 %
Date Time Interrogation Session: 20230620042542
Implantable Lead Implant Date: 20200624
Implantable Lead Implant Date: 20200624
Implantable Lead Location: 753859
Implantable Lead Location: 753860
Implantable Lead Model: 5076
Implantable Lead Model: 5076
Implantable Pulse Generator Implant Date: 20200624
Lead Channel Impedance Value: 285 Ohm
Lead Channel Impedance Value: 304 Ohm
Lead Channel Impedance Value: 342 Ohm
Lead Channel Impedance Value: 361 Ohm
Lead Channel Pacing Threshold Amplitude: 0.5 V
Lead Channel Pacing Threshold Amplitude: 0.875 V
Lead Channel Pacing Threshold Pulse Width: 0.4 ms
Lead Channel Pacing Threshold Pulse Width: 0.4 ms
Lead Channel Sensing Intrinsic Amplitude: 16.125 mV
Lead Channel Sensing Intrinsic Amplitude: 16.125 mV
Lead Channel Sensing Intrinsic Amplitude: 2.5 mV
Lead Channel Sensing Intrinsic Amplitude: 2.5 mV
Lead Channel Setting Pacing Amplitude: 1.5 V
Lead Channel Setting Pacing Amplitude: 2.5 V
Lead Channel Setting Pacing Pulse Width: 0.4 ms
Lead Channel Setting Sensing Sensitivity: 2.8 mV

## 2022-03-22 ENCOUNTER — Ambulatory Visit (INDEPENDENT_AMBULATORY_CARE_PROVIDER_SITE_OTHER): Payer: Medicare Other

## 2022-03-22 ENCOUNTER — Other Ambulatory Visit: Payer: Self-pay | Admitting: *Deleted

## 2022-03-22 ENCOUNTER — Other Ambulatory Visit: Payer: Self-pay | Admitting: Cardiology

## 2022-03-22 DIAGNOSIS — I442 Atrioventricular block, complete: Secondary | ICD-10-CM | POA: Diagnosis not present

## 2022-03-22 DIAGNOSIS — I4891 Unspecified atrial fibrillation: Secondary | ICD-10-CM

## 2022-03-22 NOTE — Telephone Encounter (Signed)
Prescription refill request for Eliquis received. Indication: Afib  Last office visit: 03/01/22 (Camnitz)  Scr: 0.86 (10/16/21) Age: 88 Weight: 58.4kg  Appropriate dose and refill sent to requested pharmacy.

## 2022-03-29 NOTE — Progress Notes (Signed)
Remote pacemaker transmission.   

## 2022-04-17 ENCOUNTER — Ambulatory Visit
Admission: RE | Admit: 2022-04-17 | Discharge: 2022-04-17 | Disposition: A | Discharge status: Home / Self Care | Payer: Medicare Other | Source: Ambulatory Visit | Attending: Internal Medicine | Admitting: Internal Medicine

## 2022-04-17 DIAGNOSIS — Z13820 Encounter for screening for osteoporosis: Secondary | ICD-10-CM

## 2022-04-19 ENCOUNTER — Encounter: Payer: Self-pay | Admitting: Internal Medicine

## 2022-04-19 DIAGNOSIS — M810 Age-related osteoporosis without current pathological fracture: Secondary | ICD-10-CM | POA: Insufficient documentation

## 2022-04-20 ENCOUNTER — Encounter: Payer: Self-pay | Admitting: Internal Medicine

## 2022-04-30 ENCOUNTER — Ambulatory Visit: Payer: Medicare Other | Admitting: Internal Medicine

## 2022-05-06 ENCOUNTER — Other Ambulatory Visit: Payer: Self-pay | Admitting: Internal Medicine

## 2022-05-06 DIAGNOSIS — I10 Essential (primary) hypertension: Secondary | ICD-10-CM

## 2022-05-10 ENCOUNTER — Other Ambulatory Visit: Payer: Self-pay | Admitting: Internal Medicine

## 2022-05-10 DIAGNOSIS — I10 Essential (primary) hypertension: Secondary | ICD-10-CM

## 2022-05-23 ENCOUNTER — Other Ambulatory Visit: Payer: Medicare Other

## 2022-05-31 ENCOUNTER — Ambulatory Visit: Payer: Medicare Other | Admitting: Endocrinology

## 2022-06-20 LAB — CUP PACEART REMOTE DEVICE CHECK
Battery Remaining Longevity: 90 mo
Battery Voltage: 2.99 V
Brady Statistic AP VP Percent: 23.93 %
Brady Statistic AP VS Percent: 0 %
Brady Statistic AS VP Percent: 76.04 %
Brady Statistic AS VS Percent: 0.02 %
Brady Statistic RA Percent Paced: 23.26 %
Brady Statistic RV Percent Paced: 99.98 %
Date Time Interrogation Session: 20230918223312
Implantable Lead Implant Date: 20200624
Implantable Lead Implant Date: 20200624
Implantable Lead Location: 753859
Implantable Lead Location: 753860
Implantable Lead Model: 5076
Implantable Lead Model: 5076
Implantable Pulse Generator Implant Date: 20200624
Lead Channel Impedance Value: 304 Ohm
Lead Channel Impedance Value: 304 Ohm
Lead Channel Impedance Value: 361 Ohm
Lead Channel Impedance Value: 361 Ohm
Lead Channel Pacing Threshold Amplitude: 0.5 V
Lead Channel Pacing Threshold Amplitude: 0.875 V
Lead Channel Pacing Threshold Pulse Width: 0.4 ms
Lead Channel Pacing Threshold Pulse Width: 0.4 ms
Lead Channel Sensing Intrinsic Amplitude: 16.125 mV
Lead Channel Sensing Intrinsic Amplitude: 16.125 mV
Lead Channel Sensing Intrinsic Amplitude: 3.125 mV
Lead Channel Sensing Intrinsic Amplitude: 3.125 mV
Lead Channel Setting Pacing Amplitude: 1.5 V
Lead Channel Setting Pacing Amplitude: 2.5 V
Lead Channel Setting Pacing Pulse Width: 0.4 ms
Lead Channel Setting Sensing Sensitivity: 2.8 mV

## 2022-06-21 ENCOUNTER — Ambulatory Visit (INDEPENDENT_AMBULATORY_CARE_PROVIDER_SITE_OTHER): Payer: Medicare Other

## 2022-06-21 DIAGNOSIS — I442 Atrioventricular block, complete: Secondary | ICD-10-CM | POA: Diagnosis not present

## 2022-07-02 NOTE — Progress Notes (Signed)
Remote pacemaker transmission.   

## 2022-09-20 ENCOUNTER — Ambulatory Visit (INDEPENDENT_AMBULATORY_CARE_PROVIDER_SITE_OTHER): Payer: Medicare Other

## 2022-09-20 DIAGNOSIS — I442 Atrioventricular block, complete: Secondary | ICD-10-CM | POA: Diagnosis not present

## 2022-09-20 LAB — CUP PACEART REMOTE DEVICE CHECK
Battery Remaining Longevity: 88 mo
Battery Voltage: 2.99 V
Brady Statistic AP VP Percent: 14.83 %
Brady Statistic AP VS Percent: 0 %
Brady Statistic AS VP Percent: 85.12 %
Brady Statistic AS VS Percent: 0.04 %
Brady Statistic RA Percent Paced: 14.72 %
Brady Statistic RV Percent Paced: 99.96 %
Date Time Interrogation Session: 20231221010618
Implantable Lead Connection Status: 753985
Implantable Lead Connection Status: 753985
Implantable Lead Implant Date: 20200624
Implantable Lead Implant Date: 20200624
Implantable Lead Location: 753859
Implantable Lead Location: 753860
Implantable Lead Model: 5076
Implantable Lead Model: 5076
Implantable Pulse Generator Implant Date: 20200624
Lead Channel Impedance Value: 304 Ohm
Lead Channel Impedance Value: 304 Ohm
Lead Channel Impedance Value: 361 Ohm
Lead Channel Impedance Value: 361 Ohm
Lead Channel Pacing Threshold Amplitude: 0.5 V
Lead Channel Pacing Threshold Amplitude: 0.75 V
Lead Channel Pacing Threshold Pulse Width: 0.4 ms
Lead Channel Pacing Threshold Pulse Width: 0.4 ms
Lead Channel Sensing Intrinsic Amplitude: 11.5 mV
Lead Channel Sensing Intrinsic Amplitude: 11.5 mV
Lead Channel Sensing Intrinsic Amplitude: 3 mV
Lead Channel Sensing Intrinsic Amplitude: 3 mV
Lead Channel Setting Pacing Amplitude: 1.5 V
Lead Channel Setting Pacing Amplitude: 2.5 V
Lead Channel Setting Pacing Pulse Width: 0.4 ms
Lead Channel Setting Sensing Sensitivity: 2.8 mV
Zone Setting Status: 755011
Zone Setting Status: 755011

## 2022-10-12 NOTE — Progress Notes (Signed)
Remote pacemaker transmission.   

## 2022-10-14 ENCOUNTER — Other Ambulatory Visit: Payer: Self-pay | Admitting: Cardiology

## 2022-10-14 DIAGNOSIS — I4891 Unspecified atrial fibrillation: Secondary | ICD-10-CM

## 2022-10-15 NOTE — Telephone Encounter (Signed)
Prescription refill request for Eliquis received. Indication:afib Last office visit:6/23 Scr:0.9 Age: 89 Weight:58.4 kg  Prescription refilled

## 2022-11-04 ENCOUNTER — Other Ambulatory Visit: Payer: Self-pay | Admitting: Internal Medicine

## 2022-11-04 DIAGNOSIS — I10 Essential (primary) hypertension: Secondary | ICD-10-CM

## 2022-11-05 ENCOUNTER — Other Ambulatory Visit: Payer: Self-pay | Admitting: Internal Medicine

## 2022-11-05 DIAGNOSIS — I10 Essential (primary) hypertension: Secondary | ICD-10-CM

## 2022-11-19 ENCOUNTER — Telehealth: Payer: Self-pay

## 2022-11-19 NOTE — Telephone Encounter (Signed)
Patient son called in wanting to put on file that patient was not feeling well 2/18 and for her next remote he would like someone to check into it. It was not heart related he just wants to know if anything was seen that day. I have asked son to send a manual since this is concerning. Son states he will send one when he gets with patient

## 2022-12-03 ENCOUNTER — Other Ambulatory Visit: Payer: Self-pay | Admitting: Internal Medicine

## 2022-12-03 DIAGNOSIS — I10 Essential (primary) hypertension: Secondary | ICD-10-CM

## 2022-12-20 ENCOUNTER — Ambulatory Visit (INDEPENDENT_AMBULATORY_CARE_PROVIDER_SITE_OTHER): Payer: Medicare Other

## 2022-12-20 DIAGNOSIS — I442 Atrioventricular block, complete: Secondary | ICD-10-CM

## 2022-12-20 LAB — CUP PACEART REMOTE DEVICE CHECK
Battery Remaining Longevity: 85 mo
Battery Voltage: 2.98 V
Brady Statistic AP VP Percent: 12.52 %
Brady Statistic AP VS Percent: 0 %
Brady Statistic AS VP Percent: 87.38 %
Brady Statistic AS VS Percent: 0.09 %
Brady Statistic RA Percent Paced: 12.49 %
Brady Statistic RV Percent Paced: 99.9 %
Date Time Interrogation Session: 20240321131454
Implantable Lead Connection Status: 753985
Implantable Lead Connection Status: 753985
Implantable Lead Implant Date: 20200624
Implantable Lead Implant Date: 20200624
Implantable Lead Location: 753859
Implantable Lead Location: 753860
Implantable Lead Model: 5076
Implantable Lead Model: 5076
Implantable Pulse Generator Implant Date: 20200624
Lead Channel Impedance Value: 285 Ohm
Lead Channel Impedance Value: 285 Ohm
Lead Channel Impedance Value: 323 Ohm
Lead Channel Impedance Value: 361 Ohm
Lead Channel Pacing Threshold Amplitude: 0.375 V
Lead Channel Pacing Threshold Amplitude: 0.875 V
Lead Channel Pacing Threshold Pulse Width: 0.4 ms
Lead Channel Pacing Threshold Pulse Width: 0.4 ms
Lead Channel Sensing Intrinsic Amplitude: 11.5 mV
Lead Channel Sensing Intrinsic Amplitude: 11.5 mV
Lead Channel Sensing Intrinsic Amplitude: 2.625 mV
Lead Channel Sensing Intrinsic Amplitude: 2.625 mV
Lead Channel Setting Pacing Amplitude: 1.5 V
Lead Channel Setting Pacing Amplitude: 2.5 V
Lead Channel Setting Pacing Pulse Width: 0.4 ms
Lead Channel Setting Sensing Sensitivity: 2.8 mV
Zone Setting Status: 755011
Zone Setting Status: 755011

## 2023-01-23 NOTE — Progress Notes (Signed)
Remote pacemaker transmission.   

## 2023-03-18 NOTE — Progress Notes (Signed)
Cardiology Office Note Date:  03/19/2023  Patient ID:  Kathryn Strickland, DOB 1933/09/26, MRN [id number] PCP:  Philip Aspen, Limmie Patricia, MD  Cardiologist:  Dr. Rennis Golden Electrophysiologist: Dr. Elberta Fortis    Chief Complaint:  annual visit  History of Present Illness: Kathryn Strickland is a 89 y.o. female with history of HTN, HLD, CHB w/PPM, Afib, hyperthyroid.  She saw Dr. Elberta Fortis 03/01/22, she was doing well, reported off methimazole.  No changes were made.  TODAY She is accompanied by her daughter in law. Pt lives independently, though starting to have short term memory problems, family provides support. She had a fall vs syncope in Feb, details are unknown, family found her on the floor when they went to pick her up in the morning, seems perhaps had been on the floor for several hours, but patient unable to give details. She did well through the day so did not seek attention though did see the PMD at som juncture with planned mor frequent visits, gets labs there regularly, and treatment/management for early dementia  The patient denies any CP, palpitations or cardiac awareness No SOB, DOE She denies difficulties with her ADLS, her daughter in law helps with the heavier tasks. No recurrent falls or syncope No bleeding or signs of bleeding   Device information MDT dual chamber PPM implanted 03/25/2019   Past Medical History:  Diagnosis Date   Arthritis    Hips,Knees    Atrial fibrillation (HCC) 04/07/2019   Cardiomyopathy (HCC) 04/20/2019   EF 45% with apical WMA- no history of CAD or symptoms of angina   Carotid bruit    LATERAL UNSPECIFIED   Chronic pain    Complete heart block (HCC) 03/2019   CRI (chronic renal insufficiency), stage 3 (moderate) (HCC) 04/20/2019   Dementia (HCC)    Short term memory   Genital disorder, female    Hypercalcemia    Hypercholesterolemia    Hyperlipidemia    Hypertension    Osteoporosis    Postmenopausal    Presence of permanent  cardiac pacemaker 03/25/2019   for complete heart block   Primary osteoarthritis of right hip 05/18/2019   Vitamin D deficiency     Past Surgical History:  Procedure Laterality Date   COLONOSCOPY     PACEMAKER IMPLANT N/A 03/25/2019   Procedure: PACEMAKER IMPLANT;  Surgeon: Regan Lemming, MD;  [facility]: [facility];  Service: Cardiovascular;  Laterality: N/A;   TOTAL HIP ARTHROPLASTY Right 06/09/2019   Procedure: TOTAL HIP ARTHROPLASTY ANTERIOR APPROACH;  Surgeon: Sheral Apley, MD;  Location: [facility];  Service: Orthopedics;  Laterality: Right;   TOTAL HIP ARTHROPLASTY Left 10/20/2019   Procedure: TOTAL HIP ARTHROPLASTY ANTERIOR APPROACH;  Surgeon: Sheral Apley, MD;  Location: [facility];  Service: Orthopedics;  Laterality: Left;    Current Outpatient Medications  Medication Sig Dispense Refill   alendronate (FOSAMAX) 70 MG tablet Take by mouth.     B Complex Vitamins (VITAMIN B COMPLEX PO) Take 1 tablet by mouth daily.     cholecalciferol (VITAMIN D3) 25 MCG (1000 UNIT) tablet Take 5,000 Units by mouth daily.     donepezil (ARICEPT) 10 MG tablet Take 10 mg by mouth at bedtime.     ELIQUIS 2.5 MG TABS tablet TAKE 1 TABLET BY MOUTH TWICE A DAY 60 tablet 5   enalapril (VASOTEC) 10 MG tablet TAKE 1 TABLET BY MOUTH EVERY DAY 90 tablet 0   hydrochlorothiazide (HYDRODIURIL) 12.5 MG tablet TAKE 1 TABLET  BY MOUTH EVERY DAY. INSURANCE PAYS FOR 90 DAYS 90 tablet 0   No current facility-administered medications for this visit.    Allergies:   Patient has no known allergies.   Social History:  The patient  reports that she has never smoked. She has never used smokeless tobacco. She reports that she does not drink alcohol and does not use drugs.   Family History:  The patient's family history includes CAD (age of onset: 2) in her father; Colon cancer (age of onset: 33) in her mother; Heart disease in her father.  ROS:  Please see the history of present illness.    All other  systems are reviewed and otherwise negative.   PHYSICAL EXAM:  VS:  BP 124/74   Pulse 62   Ht 5\' 5"  (1.651 m)   Wt 125 lb 9.6 oz (57 kg)   SpO2 93%   BMI 20.90 kg/m  BMI: Body mass index is 20.9 kg/m. Well nourished, well developed, in no acute distress HEENT: normocephalic, atraumatic Neck: no JVD, carotid bruits or masses Cardiac:   RRR; no significant murmurs, no rubs, or gallops Lungs:  CTA b/l, no wheezing, rhonchi or rales Abd: soft, nontender MS: no deformity, age appropriate atrophy Ext: no edema Skin: warm and dry, no rash Neuro:  No gross deficits appreciated Psych: euthymic mood, full affect   PPM site is stable, no tethering or discomfort   EKG:  Done today and reviewed by myself shows  AV paced 60  Device interrogation done today and reviewed by myself:  Battery and lead measurements are good AF burden 0.9%, no RVR (all VP) No R waves today at 40 2 NSVT since last year, none in Feb    05/05/2019: TTE  1. The left ventricle has normal systolic function with an ejection  fraction of 60-65%. The cavity size was normal. Left ventricular diastolic  Doppler parameters are consistent with pseudonormalization.   2. The right ventricle has normal systolic function. The cavity was  normal. There is no increase in right ventricular wall thickness.   3. Tricuspid valve regurgitation is moderate.   4. The aortic valve is abnormal. Mild thickening of the aortic valve. No  stenosis of the aortic valve.   5. The aorta is normal in size and structure.   6. The aortic root and ascending aorta are normal in size and structure.   7. Grossly normal.   Recent Labs: No results found for requested labs within last 365 days.  No results found for requested labs within last 365 days.   CrCl cannot be calculated (Patient's most recent lab result is older than the maximum 21 days allowed.).   Wt Readings from Last 3 Encounters:  03/19/23 125 lb 9.6 oz (57 kg)  03/01/22 128 lb  12.8 oz (58.4 kg)  02/27/22 128 lb 6.4 oz (58.2 kg)     Other studies reviewed: Additional studies/records reviewed today include: summarized above  ASSESSMENT AND PLAN:  PPM Intact function No programming changes made  Paroxysmal AFib CHA2DS2Vasc is 4, on Eliquis, appropriately dosed 0.9 % burden  HTN Looks ok  Secondary hypercoagulable dose   Disposition: F/u with remotes as usual, in clinic in a year again, sooner if needed  Current medicines are reviewed at length with the patient today.  The patient did not have any concerns regarding medicines.  Norma Fredrickson, PA-C 03/19/2023 5:20 PM     [facility] [address] [place] [address] [phone])  [phone] (office)  [phone] (fax)

## 2023-03-19 ENCOUNTER — Ambulatory Visit: Payer: Medicare Other | Attending: Physician Assistant | Admitting: Physician Assistant

## 2023-03-19 ENCOUNTER — Encounter: Payer: Self-pay | Admitting: Physician Assistant

## 2023-03-19 VITALS — BP 124/74 | HR 62 | Ht 65.0 in | Wt 125.6 lb

## 2023-03-19 DIAGNOSIS — I10 Essential (primary) hypertension: Secondary | ICD-10-CM | POA: Diagnosis present

## 2023-03-19 DIAGNOSIS — Z950 Presence of cardiac pacemaker: Secondary | ICD-10-CM

## 2023-03-19 DIAGNOSIS — I480 Paroxysmal atrial fibrillation: Secondary | ICD-10-CM | POA: Diagnosis present

## 2023-03-19 DIAGNOSIS — I442 Atrioventricular block, complete: Secondary | ICD-10-CM | POA: Diagnosis present

## 2023-03-19 DIAGNOSIS — D6869 Other thrombophilia: Secondary | ICD-10-CM

## 2023-03-19 LAB — CUP PACEART INCLINIC DEVICE CHECK
Battery Remaining Longevity: 84 mo
Battery Voltage: 2.98 V
Brady Statistic AP VP Percent: 20.72 %
Brady Statistic AP VS Percent: 0 %
Brady Statistic AS VP Percent: 79.19 %
Brady Statistic AS VS Percent: 0.08 %
Brady Statistic RA Percent Paced: 20.55 %
Brady Statistic RV Percent Paced: 99.92 %
Date Time Interrogation Session: 20240618172519
Implantable Lead Connection Status: 753985
Implantable Lead Connection Status: 753985
Implantable Lead Implant Date: 20200624
Implantable Lead Implant Date: 20200624
Implantable Lead Location: 753859
Implantable Lead Location: 753860
Implantable Lead Model: 5076
Implantable Lead Model: 5076
Implantable Pulse Generator Implant Date: 20200624
Lead Channel Impedance Value: 323 Ohm
Lead Channel Impedance Value: 323 Ohm
Lead Channel Impedance Value: 380 Ohm
Lead Channel Impedance Value: 399 Ohm
Lead Channel Pacing Threshold Amplitude: 0.5 V
Lead Channel Pacing Threshold Amplitude: 0.875 V
Lead Channel Pacing Threshold Pulse Width: 0.4 ms
Lead Channel Pacing Threshold Pulse Width: 0.4 ms
Lead Channel Sensing Intrinsic Amplitude: 1.5 mV
Lead Channel Sensing Intrinsic Amplitude: 17 mV
Lead Channel Sensing Intrinsic Amplitude: 17 mV
Lead Channel Sensing Intrinsic Amplitude: 2.125 mV
Lead Channel Setting Pacing Amplitude: 1.5 V
Lead Channel Setting Pacing Amplitude: 2.5 V
Lead Channel Setting Pacing Pulse Width: 0.4 ms
Lead Channel Setting Sensing Sensitivity: 2.8 mV
Zone Setting Status: 755011
Zone Setting Status: 755011

## 2023-03-19 NOTE — Patient Instructions (Signed)
Medication Instructions:   Your physician recommends that you continue on your current medications as directed. Please refer to the Current Medication list given to you today.   *If you need a refill on your cardiac medications before your next appointment, please call your pharmacy*   Lab Work: NONE ORDERED  TODAY    If you have labs (blood work) drawn today and your tests are completely normal, you will receive your results only by: MyChart Message (if you have MyChart) OR A paper copy in the mail If you have any lab test that is abnormal or we need to change your treatment, we will call you to review the results.   Testing/Procedures: NONE ORDERED  TODAY     Follow-Up: At [facility], you and your health needs are our priority.  As part of our continuing mission to provide you with exceptional heart care, we have created designated Provider Care Teams.  These Care Teams include your primary Cardiologist (physician) and Advanced Practice Providers (APPs -  Physician Assistants and Nurse Practitioners) who all work together to provide you with the care you need, when you need it.  We recommend signing up for the patient portal called "MyChart".  Sign up information is provided on this After Visit Summary.  MyChart is used to connect with patients for Virtual Visits (Telemedicine).  Patients are able to view lab/test results, encounter notes, upcoming appointments, etc.  Non-urgent messages can be sent to your provider as well.   To learn more about what you can do with MyChart, go to [email or web address].    Your next appointment:   1 year(s)  Provider:   You may see Will Martin Camnitz, MD or one of the following Advanced Practice Providers on your designated Care Team:   Renee Ursuy, PA-C  Other Instructions  

## 2023-03-20 LAB — CUP PACEART REMOTE DEVICE CHECK
Battery Remaining Longevity: 82 mo
Battery Voltage: 2.98 V
Brady Statistic AP VP Percent: 33.06 %
Brady Statistic AP VS Percent: 0 %
Brady Statistic AS VP Percent: 66.82 %
Brady Statistic AS VS Percent: 0.12 %
Brady Statistic RA Percent Paced: 33.09 %
Brady Statistic RV Percent Paced: 99.88 %
Date Time Interrogation Session: 20240619201054
Implantable Lead Connection Status: 753985
Implantable Lead Connection Status: 753985
Implantable Lead Implant Date: 20200624
Implantable Lead Implant Date: 20200624
Implantable Lead Location: 753859
Implantable Lead Location: 753860
Implantable Lead Model: 5076
Implantable Lead Model: 5076
Implantable Pulse Generator Implant Date: 20200624
Lead Channel Impedance Value: 304 Ohm
Lead Channel Impedance Value: 304 Ohm
Lead Channel Impedance Value: 361 Ohm
Lead Channel Impedance Value: 380 Ohm
Lead Channel Pacing Threshold Amplitude: 0.5 V
Lead Channel Pacing Threshold Amplitude: 0.875 V
Lead Channel Pacing Threshold Pulse Width: 0.4 ms
Lead Channel Pacing Threshold Pulse Width: 0.4 ms
Lead Channel Sensing Intrinsic Amplitude: 17 mV
Lead Channel Sensing Intrinsic Amplitude: 17 mV
Lead Channel Sensing Intrinsic Amplitude: 2.25 mV
Lead Channel Sensing Intrinsic Amplitude: 2.25 mV
Lead Channel Setting Pacing Amplitude: 1.5 V
Lead Channel Setting Pacing Amplitude: 2.5 V
Lead Channel Setting Pacing Pulse Width: 0.4 ms
Lead Channel Setting Sensing Sensitivity: 2.8 mV
Zone Setting Status: 755011
Zone Setting Status: 755011

## 2023-03-21 ENCOUNTER — Ambulatory Visit: Payer: Medicare Other

## 2023-03-21 DIAGNOSIS — I442 Atrioventricular block, complete: Secondary | ICD-10-CM | POA: Diagnosis not present

## 2023-04-10 NOTE — Progress Notes (Signed)
Remote pacemaker transmission.   

## 2023-06-20 ENCOUNTER — Ambulatory Visit (INDEPENDENT_AMBULATORY_CARE_PROVIDER_SITE_OTHER): Payer: Medicare Other

## 2023-06-20 DIAGNOSIS — I442 Atrioventricular block, complete: Secondary | ICD-10-CM | POA: Diagnosis not present

## 2023-07-01 NOTE — Progress Notes (Signed)
Remote pacemaker transmission.   

## 2023-07-16 ENCOUNTER — Other Ambulatory Visit: Payer: Self-pay | Admitting: Cardiology

## 2023-07-16 DIAGNOSIS — I4891 Unspecified atrial fibrillation: Secondary | ICD-10-CM

## 2023-07-17 NOTE — Telephone Encounter (Signed)
Prescription refill request for Eliquis received. Indication: PAF Last office visit: 03/19/23  R Ursuy PA-C Scr: 0.97 on 10/03/22  Epic Age: 89 Weight: 57kg  Based on above findings Eliquis 5mg  twice daily is the appropriate dose.  Refill approved.

## 2023-08-23 ENCOUNTER — Other Ambulatory Visit: Payer: Self-pay

## 2023-08-23 ENCOUNTER — Emergency Department (HOSPITAL_COMMUNITY): Payer: Medicare Other

## 2023-08-23 ENCOUNTER — Emergency Department (HOSPITAL_COMMUNITY)
Admission: EM | Admit: 2023-08-23 | Discharge: 2023-08-24 | Disposition: A | Discharge status: Home / Self Care | Payer: Medicare Other | Attending: Emergency Medicine | Admitting: Emergency Medicine

## 2023-08-23 DIAGNOSIS — R531 Weakness: Secondary | ICD-10-CM | POA: Diagnosis present

## 2023-08-23 DIAGNOSIS — Z7901 Long term (current) use of anticoagulants: Secondary | ICD-10-CM | POA: Diagnosis not present

## 2023-08-23 DIAGNOSIS — Z79899 Other long term (current) drug therapy: Secondary | ICD-10-CM | POA: Insufficient documentation

## 2023-08-23 DIAGNOSIS — I10 Essential (primary) hypertension: Secondary | ICD-10-CM | POA: Diagnosis not present

## 2023-08-23 DIAGNOSIS — I4891 Unspecified atrial fibrillation: Secondary | ICD-10-CM | POA: Diagnosis not present

## 2023-08-23 DIAGNOSIS — R2990 Unspecified symptoms and signs involving the nervous system: Secondary | ICD-10-CM

## 2023-08-23 LAB — I-STAT CHEM 8, ED
BUN: 27 mg/dL — ABNORMAL HIGH (ref 8–23)
Calcium, Ion: 1.3 mmol/L (ref 1.15–1.40)
Chloride: 103 mmol/L (ref 98–111)
Creatinine, Ser: 1.2 mg/dL — ABNORMAL HIGH (ref 0.44–1.00)
Glucose, Bld: 120 mg/dL — ABNORMAL HIGH (ref 70–99)
HCT: 45 % (ref 36.0–46.0)
Hemoglobin: 15.3 g/dL — ABNORMAL HIGH (ref 12.0–15.0)
Potassium: 4.3 mmol/L (ref 3.5–5.1)
Sodium: 141 mmol/L (ref 135–145)
TCO2: 24 mmol/L (ref 22–32)

## 2023-08-23 LAB — DIFFERENTIAL
Abs Immature Granulocytes: 0.04 10*3/uL (ref 0.00–0.07)
Basophils Absolute: 0 10*3/uL (ref 0.0–0.1)
Basophils Relative: 0 %
Eosinophils Absolute: 0.1 10*3/uL (ref 0.0–0.5)
Eosinophils Relative: 1 %
Immature Granulocytes: 0 %
Lymphocytes Relative: 8 %
Lymphs Abs: 1 10*3/uL (ref 0.7–4.0)
Monocytes Absolute: 0.9 10*3/uL (ref 0.1–1.0)
Monocytes Relative: 7 %
Neutro Abs: 10 10*3/uL — ABNORMAL HIGH (ref 1.7–7.7)
Neutrophils Relative %: 84 %

## 2023-08-23 LAB — COMPREHENSIVE METABOLIC PANEL
ALT: 18 U/L (ref 0–44)
AST: 28 U/L (ref 15–41)
Albumin: 4.2 g/dL (ref 3.5–5.0)
Alkaline Phosphatase: 96 U/L (ref 38–126)
Anion gap: 10 (ref 5–15)
BUN: 23 mg/dL (ref 8–23)
CO2: 24 mmol/L (ref 22–32)
Calcium: 11 mg/dL — ABNORMAL HIGH (ref 8.9–10.3)
Chloride: 106 mmol/L (ref 98–111)
Creatinine, Ser: 1.24 mg/dL — ABNORMAL HIGH (ref 0.44–1.00)
GFR, Estimated: 42 mL/min — ABNORMAL LOW (ref >60)
Glucose, Bld: 118 mg/dL — ABNORMAL HIGH (ref 70–99)
Potassium: 4.4 mmol/L (ref 3.5–5.1)
Sodium: 140 mmol/L (ref 135–145)
Total Bilirubin: 0.8 mg/dL (ref <1.2)
Total Protein: 7.2 g/dL (ref 6.5–8.1)

## 2023-08-23 LAB — CBC
HCT: 45.7 % (ref 36.0–46.0)
Hemoglobin: 14.7 g/dL (ref 12.0–15.0)
MCH: 29.5 pg (ref 26.0–34.0)
MCHC: 32.2 g/dL (ref 30.0–36.0)
MCV: 91.6 fL (ref 80.0–100.0)
Platelets: 158 10*3/uL (ref 150–400)
RBC: 4.99 MIL/uL (ref 3.87–5.11)
RDW: 13.1 % (ref 11.5–15.5)
WBC: 12.1 10*3/uL — ABNORMAL HIGH (ref 4.0–10.5)
nRBC: 0 % (ref 0.0–0.2)

## 2023-08-23 LAB — PROTIME-INR
INR: 1 (ref 0.8–1.2)
Prothrombin Time: 13.5 s (ref 11.4–15.2)

## 2023-08-23 LAB — ETHANOL: Alcohol, Ethyl (B): <10 mg/dL (ref <10)

## 2023-08-23 LAB — APTT: aPTT: 21 s — ABNORMAL LOW (ref 24–36)

## 2023-08-23 MED ORDER — SODIUM CHLORIDE 0.9 % IV BOLUS
500.0000 mL | Freq: Once | INTRAVENOUS | Status: AC
  Administered 2023-08-23: 500 mL via INTRAVENOUS

## 2023-08-23 MED ORDER — IOHEXOL 350 MG/ML SOLN
75.0000 mL | Freq: Once | INTRAVENOUS | Status: AC | PRN
  Administered 2023-08-23: 75 mL via INTRAVENOUS

## 2023-08-23 NOTE — ED Provider Notes (Signed)
Blood pressure (!) 143/64, pulse 69, resp. rate (!) 23, SpO2 100%.  Assuming care from Dr. Silverio Lay.  In short, Kathryn Strickland is a 89 y.o. female with a chief complaint of Transient Ischemic Attack .  Refer to the original H&P for additional details.  The current plan of care is to follow up on CTA.  01:52 AM  CTA without acute finding. Patient feeling well and back to baseline. Discussed case with son at bedside as well. They will follow closely with PCP as an outpatient.     Maia Plan, MD 08/24/23 [phone]

## 2023-08-23 NOTE — ED Provider Notes (Signed)
[name] Provider Note   CSN: [id number] Arrival date & time: 08/23/23  2120     History  Chief Complaint  Patient presents with   Transient Ischemic Attack    Kathryn Strickland is a 89 y.o. female history of A-fib on Eliquis, hypertension, here presenting with right-sided weakness.  Patient lives at home by herself.  Her son visited her often.  He came to visit her today this evening.  She seems to be slower than usual.  They were playing some cards and she apparently stare into space around 715pm and then slouched over to the right side.  When EMS got there her symptoms have improved.  Patient denies any history of stroke in the past.  The history is provided by the patient and a relative.       Home Medications Prior to Admission medications   Medication Sig Start Date End Date Taking? Authorizing Provider  alendronate (FOSAMAX) 70 MG tablet Take by mouth.    [provider]  B Complex Vitamins (VITAMIN B COMPLEX PO) Take 1 tablet by mouth daily.    [provider]  cholecalciferol (VITAMIN D3) 25 MCG (1000 UNIT) tablet Take 5,000 Units by mouth daily.    [provider]  donepezil (ARICEPT) 10 MG tablet Take 10 mg by mouth at bedtime.    [provider]  ELIQUIS 2.5 MG TABS tablet TAKE 1 TABLET BY MOUTH TWICE A DAY 07/17/23   Camnitz, Will Daphine Deutscher, MD  enalapril (VASOTEC) 10 MG tablet TAKE 1 TABLET BY MOUTH EVERY DAY 11/05/22   Philip Aspen, Limmie Patricia, MD  hydrochlorothiazide (HYDRODIURIL) 12.5 MG tablet TAKE 1 TABLET BY MOUTH EVERY DAY. INSURANCE PAYS FOR 90 DAYS 12/03/22   Philip Aspen, Limmie Patricia, MD      Allergies    Patient has no known allergies.    Review of Systems   Review of Systems  Neurological:  Positive for weakness.  All other systems reviewed and are negative.   Physical Exam Updated Vital Signs There were no vitals taken for this visit. Physical Exam Vitals and  nursing note reviewed.  HENT:     Head: Normocephalic.     Nose: Nose normal.     Mouth/Throat:     Mouth: Mucous membranes are moist.  Eyes:     Extraocular Movements: Extraocular movements intact.     Pupils: Pupils are equal, round, and reactive to light.  Cardiovascular:     Rate and Rhythm: Normal rate and regular rhythm.     Pulses: Normal pulses.     Heart sounds: Normal heart sounds.  Pulmonary:     Effort: Pulmonary effort is normal.     Breath sounds: Normal breath sounds.  Abdominal:     General: Abdomen is flat.     Palpations: Abdomen is soft.  Musculoskeletal:        General: Normal range of motion.     Cervical back: Normal range of motion and neck supple.  Skin:    General: Skin is warm.     Capillary Refill: Capillary refill takes less than 2 seconds.  Neurological:     General: No focal deficit present.     Mental Status: She is alert and oriented to person, place, and time.     Comments: Cranial nerves II to XII intact.  Patient has normal strength bilateral arms and legs.  No obvious facial droop  Psychiatric:  Mood and Affect: Mood normal.        Behavior: Behavior normal.     ED Results / Procedures / Treatments   Labs (all labs ordered are listed, but only abnormal results are displayed) Labs Reviewed  I-STAT CHEM 8, ED - Abnormal; Notable for the following components:      Result Value   BUN 27 (*)    Creatinine, Ser 1.20 (*)    Glucose, Bld 120 (*)    Hemoglobin 15.3 (*)    All other components within normal limits  ETHANOL  PROTIME-INR  APTT  CBC  DIFFERENTIAL  COMPREHENSIVE METABOLIC PANEL  RAPID URINE DRUG SCREEN, HOSP PERFORMED    EKG EKG Interpretation Date/Time:  Friday August 23 2023 21:45:00 EST Ventricular Rate:  70 PR Interval:  191 QRS Duration:  160 QT Interval:  476 QTC Calculation: 514 R Axis:   -88  Text Interpretation: Sinus rhythm Right bundle branch block Inferolateral infarct, acute Anterior infarct,  acute No significant change since last tracing Confirmed by Richardean Canal [phone]) on 08/23/2023 9:52:04 PM  Radiology No results found.  Procedures Procedures    Medications Ordered in ED Medications  sodium chloride 0.9 % bolus 500 mL (500 mLs Intravenous New Bag/Given 08/23/23 2150)    ED Course/ Medical Decision Making/ A&P                                 Medical Decision Making Trionna Serrao Godley is a 89 y.o. female here presenting with right-sided weakness that resolved. Patient does have a pacemaker so cannot get MRI.  I discussed case with Dr. Amada Jupiter. Since patient's symptoms are resolved, even though she is in the code stroke window, will not activate code stroke.  He   agree with CTA head and neck.  Since she is on Eliquis already, if CTA did not show any LVO patient can be discharged home.  11:01 PM Labs unremarkable. CTA head/neck pending. Signed out to Dr. Jacqulyn Bath. If CT showed no LVO and she is back to baseline, anticipate dc home with neurology follow up.   Amount and/or Complexity of Data Reviewed Labs: ordered. Radiology: ordered.  Risk Prescription drug management.    Final Clinical Impression(s) / ED Diagnoses Final diagnoses:  None    Rx / DC Orders ED Discharge Orders     None         Charlynne Pander, MD 08/23/23 2302

## 2023-08-23 NOTE — ED Triage Notes (Signed)
Pt comes from home, son visits often. The son states that she was up walking around. Then she went to get up, had right sided weakness. She states she went to get up and her right side gave out and she slumped into the chair. She also had a right sided stare. Lasted 2-3 minutes. EMS said all symptoms resolved. Only thing they noticed is orthostatic vitals.   She is A&Ox3. She has hx of dementia.

## 2023-08-24 NOTE — ED Notes (Signed)
PT WAS ABLE TO AMBULATE ACROSS HALL TO RESTROOM WITH MINIMAL ASSISTANCE. PT DENIES ANY WEAKNESS/FATIGUE/PARESTHESIA. SON REPORTS PT IS AMBULATING PER HER BASELINE WITH NO OBVIOUS DEFICITS.

## 2023-08-24 NOTE — Discharge Instructions (Signed)
Please follow up with your PCP in the coming week. Return with any new or worsening symptoms.

## 2023-09-19 ENCOUNTER — Ambulatory Visit: Payer: Medicare Other | Attending: Cardiology

## 2023-09-19 DIAGNOSIS — I442 Atrioventricular block, complete: Secondary | ICD-10-CM | POA: Diagnosis not present

## 2023-09-19 LAB — CUP PACEART REMOTE DEVICE CHECK
Battery Remaining Longevity: 75 mo
Battery Voltage: 2.97 V
Brady Statistic AP VP Percent: 37 %
Brady Statistic AP VS Percent: 0 %
Brady Statistic AS VP Percent: 62.75 %
Brady Statistic AS VS Percent: 0.25 %
Brady Statistic RA Percent Paced: 37.08 %
Brady Statistic RV Percent Paced: 99.75 %
Date Time Interrogation Session: 20241219042322
Implantable Lead Connection Status: 753985
Implantable Lead Connection Status: 753985
Implantable Lead Implant Date: 20200624
Implantable Lead Implant Date: 20200624
Implantable Lead Location: 753859
Implantable Lead Location: 753860
Implantable Lead Model: 5076
Implantable Lead Model: 5076
Implantable Pulse Generator Implant Date: 20200624
Lead Channel Impedance Value: 304 Ohm
Lead Channel Impedance Value: 304 Ohm
Lead Channel Impedance Value: 342 Ohm
Lead Channel Impedance Value: 361 Ohm
Lead Channel Pacing Threshold Amplitude: 0.5 V
Lead Channel Pacing Threshold Amplitude: 0.875 V
Lead Channel Pacing Threshold Pulse Width: 0.4 ms
Lead Channel Pacing Threshold Pulse Width: 0.4 ms
Lead Channel Sensing Intrinsic Amplitude: 17 mV
Lead Channel Sensing Intrinsic Amplitude: 17 mV
Lead Channel Sensing Intrinsic Amplitude: 2.375 mV
Lead Channel Sensing Intrinsic Amplitude: 2.375 mV
Lead Channel Setting Pacing Amplitude: 1.5 V
Lead Channel Setting Pacing Amplitude: 2.5 V
Lead Channel Setting Pacing Pulse Width: 0.4 ms
Lead Channel Setting Sensing Sensitivity: 2.8 mV
Zone Setting Status: 755011
Zone Setting Status: 755011

## 2023-10-22 NOTE — Progress Notes (Signed)
Remote pacemaker transmission.   

## 2023-12-19 ENCOUNTER — Ambulatory Visit: Payer: Medicare Other

## 2023-12-19 DIAGNOSIS — I442 Atrioventricular block, complete: Secondary | ICD-10-CM | POA: Diagnosis not present

## 2023-12-20 LAB — CUP PACEART REMOTE DEVICE CHECK
Battery Remaining Longevity: 72 mo
Battery Voltage: 2.97 V
Brady Statistic AP VP Percent: 24.39 %
Brady Statistic AP VS Percent: 0 %
Brady Statistic AS VP Percent: 75.39 %
Brady Statistic AS VS Percent: 0.21 %
Brady Statistic RA Percent Paced: 24.32 %
Brady Statistic RV Percent Paced: 99.78 %
Date Time Interrogation Session: 20250320032449
Implantable Lead Connection Status: 753985
Implantable Lead Connection Status: 753985
Implantable Lead Implant Date: 20200624
Implantable Lead Implant Date: 20200624
Implantable Lead Location: 753859
Implantable Lead Location: 753860
Implantable Lead Model: 5076
Implantable Lead Model: 5076
Implantable Pulse Generator Implant Date: 20200624
Lead Channel Impedance Value: 304 Ohm
Lead Channel Impedance Value: 323 Ohm
Lead Channel Impedance Value: 342 Ohm
Lead Channel Impedance Value: 380 Ohm
Lead Channel Pacing Threshold Amplitude: 0.375 V
Lead Channel Pacing Threshold Amplitude: 0.875 V
Lead Channel Pacing Threshold Pulse Width: 0.4 ms
Lead Channel Pacing Threshold Pulse Width: 0.4 ms
Lead Channel Sensing Intrinsic Amplitude: 18.75 mV
Lead Channel Sensing Intrinsic Amplitude: 18.75 mV
Lead Channel Sensing Intrinsic Amplitude: 2.625 mV
Lead Channel Sensing Intrinsic Amplitude: 2.625 mV
Lead Channel Setting Pacing Amplitude: 1.5 V
Lead Channel Setting Pacing Amplitude: 2.5 V
Lead Channel Setting Pacing Pulse Width: 0.4 ms
Lead Channel Setting Sensing Sensitivity: 2.8 mV
Zone Setting Status: 755011
Zone Setting Status: 755011

## 2024-01-21 ENCOUNTER — Other Ambulatory Visit: Payer: Self-pay | Admitting: Cardiology

## 2024-01-21 DIAGNOSIS — I4891 Unspecified atrial fibrillation: Secondary | ICD-10-CM

## 2024-01-21 NOTE — Telephone Encounter (Signed)
 Prescription refill request for Eliquis  received. Indication:afib Last office visit:6/24 Scr:1.02  1/25 Age: 89 Weight:57  kg  Prescription refilled

## 2024-01-29 NOTE — Progress Notes (Signed)
 Remote pacemaker transmission.

## 2024-01-29 NOTE — Addendum Note (Signed)
 Addended by: Lott Rouleau A on: 01/29/2024 10:51 AM   Modules accepted: Orders

## 2024-03-19 ENCOUNTER — Ambulatory Visit (INDEPENDENT_AMBULATORY_CARE_PROVIDER_SITE_OTHER): Payer: Medicare Other

## 2024-03-19 DIAGNOSIS — I4891 Unspecified atrial fibrillation: Secondary | ICD-10-CM | POA: Diagnosis not present

## 2024-03-19 LAB — CUP PACEART REMOTE DEVICE CHECK
Battery Remaining Longevity: 64 mo
Battery Voltage: 2.96 V
Brady Statistic AP VP Percent: 36.11 %
Brady Statistic AP VS Percent: 0 %
Brady Statistic AS VP Percent: 63.88 %
Brady Statistic AS VS Percent: 0.01 %
Brady Statistic RA Percent Paced: 36.03 %
Brady Statistic RV Percent Paced: 99.99 %
Date Time Interrogation Session: 20250618211728
Implantable Lead Connection Status: 753985
Implantable Lead Connection Status: 753985
Implantable Lead Implant Date: 20200624
Implantable Lead Implant Date: 20200624
Implantable Lead Location: 753859
Implantable Lead Location: 753860
Implantable Lead Model: 5076
Implantable Lead Model: 5076
Implantable Pulse Generator Implant Date: 20200624
Lead Channel Impedance Value: 285 Ohm
Lead Channel Impedance Value: 304 Ohm
Lead Channel Impedance Value: 342 Ohm
Lead Channel Impedance Value: 361 Ohm
Lead Channel Pacing Threshold Amplitude: 0.375 V
Lead Channel Pacing Threshold Amplitude: 1 V
Lead Channel Pacing Threshold Pulse Width: 0.4 ms
Lead Channel Pacing Threshold Pulse Width: 0.4 ms
Lead Channel Sensing Intrinsic Amplitude: 18.75 mV
Lead Channel Sensing Intrinsic Amplitude: 18.75 mV
Lead Channel Sensing Intrinsic Amplitude: 2.125 mV
Lead Channel Sensing Intrinsic Amplitude: 2.125 mV
Lead Channel Setting Pacing Amplitude: 1.5 V
Lead Channel Setting Pacing Amplitude: 2.5 V
Lead Channel Setting Pacing Pulse Width: 0.4 ms
Lead Channel Setting Sensing Sensitivity: 2.8 mV
Zone Setting Status: 755011
Zone Setting Status: 755011

## 2024-03-20 ENCOUNTER — Ambulatory Visit: Payer: Self-pay | Admitting: Cardiology

## 2024-05-20 NOTE — Progress Notes (Signed)
 Remote pacemaker transmission.

## 2024-06-18 ENCOUNTER — Ambulatory Visit (INDEPENDENT_AMBULATORY_CARE_PROVIDER_SITE_OTHER): Payer: Medicare Other

## 2024-06-18 DIAGNOSIS — I442 Atrioventricular block, complete: Secondary | ICD-10-CM

## 2024-06-18 LAB — CUP PACEART REMOTE DEVICE CHECK
Battery Remaining Longevity: 60 mo
Battery Voltage: 2.96 V
Brady Statistic AP VP Percent: 26.48 %
Brady Statistic AP VS Percent: 0 %
Brady Statistic AS VP Percent: 73.5 %
Brady Statistic AS VS Percent: 0.02 %
Brady Statistic RA Percent Paced: 26.29 %
Brady Statistic RV Percent Paced: 99.98 %
Date Time Interrogation Session: 20250918060709
Implantable Lead Connection Status: 753985
Implantable Lead Connection Status: 753985
Implantable Lead Implant Date: 20200624
Implantable Lead Implant Date: 20200624
Implantable Lead Location: 753859
Implantable Lead Location: 753860
Implantable Lead Model: 5076
Implantable Lead Model: 5076
Implantable Pulse Generator Implant Date: 20200624
Lead Channel Impedance Value: 304 Ohm
Lead Channel Impedance Value: 323 Ohm
Lead Channel Impedance Value: 342 Ohm
Lead Channel Impedance Value: 399 Ohm
Lead Channel Pacing Threshold Amplitude: 0.375 V
Lead Channel Pacing Threshold Amplitude: 0.875 V
Lead Channel Pacing Threshold Pulse Width: 0.4 ms
Lead Channel Pacing Threshold Pulse Width: 0.4 ms
Lead Channel Sensing Intrinsic Amplitude: 18.75 mV
Lead Channel Sensing Intrinsic Amplitude: 18.75 mV
Lead Channel Sensing Intrinsic Amplitude: 2.625 mV
Lead Channel Sensing Intrinsic Amplitude: 2.625 mV
Lead Channel Setting Pacing Amplitude: 1.5 V
Lead Channel Setting Pacing Amplitude: 2.5 V
Lead Channel Setting Pacing Pulse Width: 0.4 ms
Lead Channel Setting Sensing Sensitivity: 2.8 mV
Zone Setting Status: 755011
Zone Setting Status: 755011

## 2024-06-20 ENCOUNTER — Ambulatory Visit: Payer: Self-pay | Admitting: Cardiology

## 2024-06-23 NOTE — Progress Notes (Signed)
 Remote PPM Transmission

## 2024-07-18 ENCOUNTER — Other Ambulatory Visit: Payer: Self-pay | Admitting: Cardiology

## 2024-07-18 DIAGNOSIS — I4891 Unspecified atrial fibrillation: Secondary | ICD-10-CM

## 2024-07-20 NOTE — Telephone Encounter (Signed)
 Prescription refill request for Eliquis  received. Indication:afib Last office visit:needs appt Scr: Age:  Weight:  Prescription refilled

## 2024-08-15 ENCOUNTER — Other Ambulatory Visit: Payer: Self-pay | Admitting: Cardiology

## 2024-08-15 DIAGNOSIS — I4891 Unspecified atrial fibrillation: Secondary | ICD-10-CM

## 2024-08-17 NOTE — Telephone Encounter (Signed)
 Prescription refill request for Eliquis  received. Indication: a-fib Last office visit: 03/19/2023 Scr: 1.02 (EPIC 10/07/2023) Age: 89 Weight: 57kg  Per Dr Inocencio ok to fill, left message with patient to schedule over due follow up appointment

## 2024-09-16 NOTE — Progress Notes (Unsigned)
°  Electrophysiology Office Note:   Date:  09/17/2024  ID:  Kathryn Strickland, DOB [birth year], MRN [id number]  Primary Cardiologist: Will Gladis Norton, MD Primary Heart Failure: None Electrophysiologist: Will Gladis Norton, MD       History of Present Illness:   Kathryn Strickland is a 89 y.o. female with h/o CHB s/p PPM, AF, HTN, HLD, hyperthyroidism seen today for routine electrophysiology followup.   Since last being seen in our clinic the patient reports doing well. Her son accompanies her at todays visit. He notes she doesn't drink as well as he would like.  She walks around the house a lot during the day. No longer drives.   She denies chest pain, palpitations, dyspnea, PND, orthopnea, nausea, vomiting, dizziness, syncope, edema, weight gain, or early satiety.   Review of systems complete and found to be negative unless listed in HPI.   EP Information / Studies Reviewed:    EKG is ordered today. Personal review as below.  EKG Interpretation Date/Time:  Thursday September 17 2024 08:08:41 EST Ventricular Rate:  62 PR Interval:  188 QRS Duration:  148 QT Interval:  480 QTC Calculation: 487 R Axis:   -83  Text Interpretation: Atrial-sensed ventricular-paced rhythm Confirmed by Aniceto Jarvis ([id number]) on 09/17/2024 8:12:53 AM   PPM Interrogation-  reviewed in detail today,  See PACEART report.  Device History: Medtronic Dual Chamber PPM implanted 03/25/19 for CHB  Risk Assessment/Calculations:    CHA2DS2-VASc Score = 4   This indicates a 4.8% annual risk of stroke. The patient's score is based upon: CHF History: 0 HTN History: 1 Diabetes History: 0 Stroke History: 0 Vascular Disease History: 0 Age Score: 2 Gender Score: 1             Physical Exam:   VS:  BP 128/78   Pulse 62   Ht 5' 5 (1.651 m)   Wt 124 lb (56.2 kg)   SpO2 98%   BMI 20.63 kg/m    Wt Readings from Last 3 Encounters:  09/17/24 124 lb (56.2 kg)  03/19/23 125 lb 9.6 oz (57 kg)   03/01/22 128 lb 12.8 oz (58.4 kg)     GEN: Well nourished, well developed in no acute distress NECK: No JVD; No carotid bruits CARDIAC: Regular rate and rhythm, no murmurs, rubs, gallops, PPM site wnl / no tethering RESPIRATORY:  Clear to auscultation without rales, wheezing or rhonchi  ABDOMEN: Soft, non-tender, non-distended EXTREMITIES:  No edema; No deformity   ASSESSMENT AND PLAN:    CHB s/p Medtronic PPM  -Normal PPM function -See Pace Art report -No changes today  Paroxysmal Atrial Fibrillation  CHA2DS2-VASc 4 -OAC for stroke prophylaxis  -0.5% burden on device > does have long episodes at times >5h  Secondary Hypercoagulable State  -continue Eliquis  2.5 mg BID, dose reviewed and appropriately reduced by age / wt   Hypertension  -well controlled on current regimen    Disposition:   Follow up with Dr. Norton or EP APP in 12 months  Signed, Jarvis Aniceto, NP-C, AGACNP-BC [facility] - Electrophysiology  09/17/2024, 8:30 AM

## 2024-09-17 ENCOUNTER — Ambulatory Visit

## 2024-09-17 ENCOUNTER — Ambulatory Visit: Payer: Self-pay | Admitting: Cardiology

## 2024-09-17 ENCOUNTER — Ambulatory Visit: Attending: Pulmonary Disease | Admitting: Pulmonary Disease

## 2024-09-17 ENCOUNTER — Encounter: Payer: Self-pay | Admitting: Pulmonary Disease

## 2024-09-17 VITALS — BP 128/78 | HR 62 | Ht 65.0 in | Wt 124.0 lb

## 2024-09-17 DIAGNOSIS — I4891 Unspecified atrial fibrillation: Secondary | ICD-10-CM | POA: Diagnosis present

## 2024-09-17 DIAGNOSIS — I10 Essential (primary) hypertension: Secondary | ICD-10-CM | POA: Insufficient documentation

## 2024-09-17 DIAGNOSIS — I480 Paroxysmal atrial fibrillation: Secondary | ICD-10-CM | POA: Insufficient documentation

## 2024-09-17 DIAGNOSIS — I442 Atrioventricular block, complete: Secondary | ICD-10-CM | POA: Diagnosis present

## 2024-09-17 DIAGNOSIS — Z950 Presence of cardiac pacemaker: Secondary | ICD-10-CM | POA: Insufficient documentation

## 2024-09-17 DIAGNOSIS — D6869 Other thrombophilia: Secondary | ICD-10-CM | POA: Diagnosis present

## 2024-09-17 LAB — CUP PACEART INCLINIC DEVICE CHECK
Date Time Interrogation Session: 20251218093305
Implantable Lead Connection Status: 753985
Implantable Lead Connection Status: 753985
Implantable Lead Implant Date: 20200624
Implantable Lead Implant Date: 20200624
Implantable Lead Location: 753859
Implantable Lead Location: 753860
Implantable Lead Model: 5076
Implantable Lead Model: 5076
Implantable Pulse Generator Implant Date: 20200624

## 2024-09-17 MED ORDER — APIXABAN 2.5 MG PO TABS: 2.5000 mg | ORAL_TABLET | Freq: Two times a day (BID) | ORAL | 11 refills | Status: DC

## 2024-09-17 NOTE — Patient Instructions (Addendum)
 Medication Instructions:  No medications changes today   *If you need a refill on your cardiac medications before your next appointment, please call your pharmacy*  Lab Work: No lab work today If you have labs (blood work) drawn today and your tests are completely normal, you will receive your results only by: MyChart Message (if you have MyChart) OR A paper copy in the mail If you have any lab test that is abnormal or we need to change your treatment, we will call you to review the results.  Testing/Procedures: No testing/procedures were scheduled today  Follow-Up: At [facility], you and your health needs are our priority.  As part of our continuing mission to provide you with exceptional heart care, our providers are all part of one team.  This team includes your primary Cardiologist (physician) and Advanced Practice Providers or APPs (Physician Assistants and Nurse Practitioners) who all work together to provide you with the care you need, when you need it.  Your next appointment:   1 year(s)  Provider:   You may see Will Gladis Norton, MD or one of the following Advanced Practice Providers on your designated Care Team:    Daphne Barrack, NP

## 2024-09-18 LAB — CUP PACEART REMOTE DEVICE CHECK
Battery Remaining Longevity: 53 mo
Battery Voltage: 2.95 V
Brady Statistic AP VP Percent: 25.56 %
Brady Statistic AP VS Percent: 0 %
Brady Statistic AS VP Percent: 74.41 %
Brady Statistic AS VS Percent: 0.02 %
Brady Statistic RA Percent Paced: 25.23 %
Brady Statistic RV Percent Paced: 99.98 %
Date Time Interrogation Session: 20251217230228
Implantable Lead Connection Status: 753985
Implantable Lead Connection Status: 753985
Implantable Lead Implant Date: 20200624
Implantable Lead Implant Date: 20200624
Implantable Lead Location: 753859
Implantable Lead Location: 753860
Implantable Lead Model: 5076
Implantable Lead Model: 5076
Implantable Pulse Generator Implant Date: 20200624
Lead Channel Impedance Value: 304 Ohm
Lead Channel Impedance Value: 323 Ohm
Lead Channel Impedance Value: 342 Ohm
Lead Channel Impedance Value: 380 Ohm
Lead Channel Pacing Threshold Amplitude: 0.375 V
Lead Channel Pacing Threshold Amplitude: 0.75 V
Lead Channel Pacing Threshold Pulse Width: 0.4 ms
Lead Channel Pacing Threshold Pulse Width: 0.4 ms
Lead Channel Sensing Intrinsic Amplitude: 18.75 mV
Lead Channel Sensing Intrinsic Amplitude: 18.75 mV
Lead Channel Sensing Intrinsic Amplitude: 2.625 mV
Lead Channel Sensing Intrinsic Amplitude: 2.625 mV
Lead Channel Setting Pacing Amplitude: 1.5 V
Lead Channel Setting Pacing Amplitude: 2.5 V
Lead Channel Setting Pacing Pulse Width: 0.4 ms
Lead Channel Setting Sensing Sensitivity: 2.8 mV
Zone Setting Status: 755011
Zone Setting Status: 755011

## 2024-09-18 NOTE — Progress Notes (Signed)
 Remote PPM Transmission

## 2024-09-25 ENCOUNTER — Ambulatory Visit: Payer: Self-pay | Admitting: Cardiology

## 2024-10-10 ENCOUNTER — Other Ambulatory Visit: Payer: Self-pay

## 2024-10-10 ENCOUNTER — Inpatient Hospital Stay (HOSPITAL_COMMUNITY)
Admission: EM | Admit: 2024-10-10 | Discharge: 2024-10-13 | DRG: 082 | Disposition: A | Discharge status: Skilled Nursing Facility | Attending: Internal Medicine | Admitting: Internal Medicine

## 2024-10-10 ENCOUNTER — Emergency Department (HOSPITAL_COMMUNITY)

## 2024-10-10 ENCOUNTER — Encounter (HOSPITAL_COMMUNITY): Payer: Self-pay

## 2024-10-10 DIAGNOSIS — Z79899 Other long term (current) drug therapy: Secondary | ICD-10-CM | POA: Diagnosis not present

## 2024-10-10 DIAGNOSIS — R1310 Dysphagia, unspecified: Secondary | ICD-10-CM | POA: Diagnosis present

## 2024-10-10 DIAGNOSIS — F03B0 Unspecified dementia, moderate, without behavioral disturbance, psychotic disturbance, mood disturbance, and anxiety: Secondary | ICD-10-CM | POA: Diagnosis present

## 2024-10-10 DIAGNOSIS — Z66 Do not resuscitate: Secondary | ICD-10-CM | POA: Diagnosis present

## 2024-10-10 DIAGNOSIS — Z7983 Long term (current) use of bisphosphonates: Secondary | ICD-10-CM | POA: Diagnosis not present

## 2024-10-10 DIAGNOSIS — W19XXXA Unspecified fall, initial encounter: Secondary | ICD-10-CM | POA: Diagnosis present

## 2024-10-10 DIAGNOSIS — R296 Repeated falls: Secondary | ICD-10-CM | POA: Diagnosis present

## 2024-10-10 DIAGNOSIS — R29810 Facial weakness: Secondary | ICD-10-CM | POA: Diagnosis present

## 2024-10-10 DIAGNOSIS — R471 Dysarthria and anarthria: Secondary | ICD-10-CM | POA: Diagnosis present

## 2024-10-10 DIAGNOSIS — E039 Hypothyroidism, unspecified: Secondary | ICD-10-CM | POA: Diagnosis present

## 2024-10-10 DIAGNOSIS — N2889 Other specified disorders of kidney and ureter: Secondary | ICD-10-CM | POA: Diagnosis present

## 2024-10-10 DIAGNOSIS — Z950 Presence of cardiac pacemaker: Secondary | ICD-10-CM

## 2024-10-10 DIAGNOSIS — N1832 Chronic kidney disease, stage 3b: Secondary | ICD-10-CM | POA: Diagnosis present

## 2024-10-10 DIAGNOSIS — I429 Cardiomyopathy, unspecified: Secondary | ICD-10-CM

## 2024-10-10 DIAGNOSIS — I442 Atrioventricular block, complete: Secondary | ICD-10-CM | POA: Diagnosis present

## 2024-10-10 DIAGNOSIS — G8191 Hemiplegia, unspecified affecting right dominant side: Secondary | ICD-10-CM | POA: Diagnosis present

## 2024-10-10 DIAGNOSIS — G935 Compression of brain: Secondary | ICD-10-CM | POA: Diagnosis present

## 2024-10-10 DIAGNOSIS — Z7901 Long term (current) use of anticoagulants: Secondary | ICD-10-CM | POA: Diagnosis not present

## 2024-10-10 DIAGNOSIS — Z8249 Family history of ischemic heart disease and other diseases of the circulatory system: Secondary | ICD-10-CM

## 2024-10-10 DIAGNOSIS — S065XAA Traumatic subdural hemorrhage with loss of consciousness status unknown, initial encounter: Secondary | ICD-10-CM | POA: Diagnosis present

## 2024-10-10 DIAGNOSIS — I255 Ischemic cardiomyopathy: Secondary | ICD-10-CM | POA: Diagnosis present

## 2024-10-10 DIAGNOSIS — Z515 Encounter for palliative care: Secondary | ICD-10-CM | POA: Diagnosis not present

## 2024-10-10 DIAGNOSIS — M1611 Unilateral primary osteoarthritis, right hip: Secondary | ICD-10-CM | POA: Diagnosis present

## 2024-10-10 DIAGNOSIS — I10 Essential (primary) hypertension: Secondary | ICD-10-CM | POA: Diagnosis present

## 2024-10-10 DIAGNOSIS — I129 Hypertensive chronic kidney disease with stage 1 through stage 4 chronic kidney disease, or unspecified chronic kidney disease: Secondary | ICD-10-CM | POA: Diagnosis present

## 2024-10-10 DIAGNOSIS — R29704 NIHSS score 4: Secondary | ICD-10-CM | POA: Diagnosis present

## 2024-10-10 DIAGNOSIS — R059 Cough, unspecified: Secondary | ICD-10-CM | POA: Diagnosis present

## 2024-10-10 DIAGNOSIS — I482 Chronic atrial fibrillation, unspecified: Secondary | ICD-10-CM | POA: Diagnosis present

## 2024-10-10 DIAGNOSIS — E0590 Thyrotoxicosis, unspecified without thyrotoxic crisis or storm: Secondary | ICD-10-CM | POA: Diagnosis present

## 2024-10-10 DIAGNOSIS — Z96643 Presence of artificial hip joint, bilateral: Secondary | ICD-10-CM | POA: Diagnosis present

## 2024-10-10 DIAGNOSIS — Z602 Problems related to living alone: Secondary | ICD-10-CM | POA: Diagnosis present

## 2024-10-10 DIAGNOSIS — Z800 Family history of malignant neoplasm of digestive organs: Secondary | ICD-10-CM

## 2024-10-10 DIAGNOSIS — M810 Age-related osteoporosis without current pathological fracture: Secondary | ICD-10-CM | POA: Diagnosis present

## 2024-10-10 DIAGNOSIS — Y92009 Unspecified place in unspecified non-institutional (private) residence as the place of occurrence of the external cause: Secondary | ICD-10-CM | POA: Diagnosis not present

## 2024-10-10 DIAGNOSIS — E7800 Pure hypercholesterolemia, unspecified: Secondary | ICD-10-CM | POA: Diagnosis present

## 2024-10-10 DIAGNOSIS — I4891 Unspecified atrial fibrillation: Secondary | ICD-10-CM | POA: Diagnosis present

## 2024-10-10 LAB — URINALYSIS, ROUTINE W REFLEX MICROSCOPIC
Bacteria, UA: NONE SEEN
Bilirubin Urine: NEGATIVE
Glucose, UA: NEGATIVE mg/dL
Hgb urine dipstick: NEGATIVE
Ketones, ur: 5 mg/dL — AB
Leukocytes,Ua: NEGATIVE
Nitrite: NEGATIVE
Protein, ur: 100 mg/dL — AB
Specific Gravity, Urine: 1.021 (ref 1.005–1.030)
pH: 5 (ref 5.0–8.0)

## 2024-10-10 LAB — DIFFERENTIAL
Abs Immature Granulocytes: 0.07 K/uL (ref 0.00–0.07)
Basophils Absolute: 0 K/uL (ref 0.0–0.1)
Basophils Relative: 0 %
Eosinophils Absolute: 0 K/uL (ref 0.0–0.5)
Eosinophils Relative: 0 %
Immature Granulocytes: 1 %
Lymphocytes Relative: 6 %
Lymphs Abs: 0.7 K/uL (ref 0.7–4.0)
Monocytes Absolute: 0.6 K/uL (ref 0.1–1.0)
Monocytes Relative: 5 %
Neutro Abs: 10.4 K/uL — ABNORMAL HIGH (ref 1.7–7.7)
Neutrophils Relative %: 88 %

## 2024-10-10 LAB — CBC
HCT: 42.7 % (ref 36.0–46.0)
Hemoglobin: 13.6 g/dL (ref 12.0–15.0)
MCH: 27.5 pg (ref 26.0–34.0)
MCHC: 31.9 g/dL (ref 30.0–36.0)
MCV: 86.3 fL (ref 80.0–100.0)
Platelets: 206 K/uL (ref 150–400)
RBC: 4.95 MIL/uL (ref 3.87–5.11)
RDW: 14.7 % (ref 11.5–15.5)
WBC: 11.8 K/uL — ABNORMAL HIGH (ref 4.0–10.5)
nRBC: 0 % (ref 0.0–0.2)

## 2024-10-10 LAB — COMPREHENSIVE METABOLIC PANEL WITH GFR
ALT: 32 U/L (ref 0–44)
AST: 70 U/L — ABNORMAL HIGH (ref 15–41)
Albumin: 3.8 g/dL (ref 3.5–5.0)
Alkaline Phosphatase: 146 U/L — ABNORMAL HIGH (ref 38–126)
Anion gap: 13 (ref 5–15)
BUN: 24 mg/dL — ABNORMAL HIGH (ref 8–23)
CO2: 23 mmol/L (ref 22–32)
Calcium: 10.2 mg/dL (ref 8.9–10.3)
Chloride: 101 mmol/L (ref 98–111)
Creatinine, Ser: 0.93 mg/dL (ref 0.44–1.00)
GFR, Estimated: 58 mL/min — ABNORMAL LOW
Glucose, Bld: 131 mg/dL — ABNORMAL HIGH (ref 70–99)
Potassium: 3.7 mmol/L (ref 3.5–5.1)
Sodium: 137 mmol/L (ref 135–145)
Total Bilirubin: 0.9 mg/dL (ref 0.0–1.2)
Total Protein: 6.9 g/dL (ref 6.5–8.1)

## 2024-10-10 LAB — URINE DRUG SCREEN
Amphetamines: NEGATIVE
Barbiturates: NEGATIVE
Benzodiazepines: NEGATIVE
Cocaine: NEGATIVE
Fentanyl: NEGATIVE
Methadone Scn, Ur: NEGATIVE
Opiates: NEGATIVE
Tetrahydrocannabinol: NEGATIVE

## 2024-10-10 LAB — PROTIME-INR
INR: 1.1 (ref 0.8–1.2)
Prothrombin Time: 15 s (ref 11.4–15.2)

## 2024-10-10 LAB — APTT: aPTT: 30 s (ref 24–36)

## 2024-10-10 LAB — CK: Total CK: 1360 U/L — ABNORMAL HIGH (ref 38–234)

## 2024-10-10 LAB — ETHANOL: Alcohol, Ethyl (B): <15 mg/dL

## 2024-10-10 MED ORDER — PROTHROMBIN COMPLEX CONC HUMAN 500 UNITS IV KIT
1682.0000 [IU] | PACK | Status: AC
  Administered 2024-10-10: 1682 [IU] via INTRAVENOUS
  Filled 2024-10-10: qty 1682

## 2024-10-10 MED ORDER — IOHEXOL 350 MG/ML SOLN
75.0000 mL | Freq: Once | INTRAVENOUS | Status: AC | PRN
  Administered 2024-10-10: 75 mL via INTRAVENOUS

## 2024-10-10 MED ORDER — LABETALOL HCL 5 MG/ML IV SOLN: 5.0000 mg | INTRAVENOUS | Status: DC | PRN

## 2024-10-10 MED ORDER — SODIUM CHLORIDE 0.9 % IV BOLUS
1000.0000 mL | Freq: Once | INTRAVENOUS | Status: AC
  Administered 2024-10-10: 1000 mL via INTRAVENOUS

## 2024-10-10 NOTE — ED Notes (Signed)
 Per dr. Patsey no blood cultures needed now as afebrile

## 2024-10-10 NOTE — ED Notes (Signed)
 [name] son looking for MOST form / DNR info

## 2024-10-10 NOTE — ED Provider Notes (Cosign Needed)
 Assume Care - Medical Decision Making  Care of patient assumed from previous emergency medicine provider at 2100. See their note for further details of history, physical exam and plan.  Briefly, Kathryn Strickland is a 89 y.o. female who presents with: Concerns after a fall.  Clinical Course as of 10/10/24 2306  Sat Oct 10, 2024  2100 Found down for >24 hours, family called EMS -R sided weakness without neglect [SE]    Clinical Course User Index [SE] Guillermina Hamilton, MD     Reassessment: I personally reassessed the patient:  Vital Signs:  The most current vitals were  Vitals:   10/10/24 2130 10/10/24 2145 10/10/24 2233 10/10/24 2250  BP: (!) 158/69 (!) 164/76 (!) 147/79 (!) 158/82  Pulse:    78  Resp: 19 (!) 24 (!) 25 (!) 22  Temp:      TempSrc:      SpO2:    96%  Weight:      Height:       Hemodynamics:  The patient is hemodynamically stable. Mental Status:  The patient is alert  Additional MDM/ED Course: Patient hemodynamically stable to time of handoff.  At handoff head CT read by myself with obvious large subdural hematoma with 6 mm midline shift and mass effect.  Neurosurgery consulted at this time and patient given PCC for reversal.  Discussed this in depth with family that this would likely be a significant head injury and she likely would not return to her neurologic baseline.  Neurosurgery able to evaluate the patient.  Recommending admission to medicine at this time.  Family still unsure whether they would like surgical intervention or not.  Spoke with medicine team regarding admission and they agree.  Patient admitted to medicine for further workup and management.  Disposition:  I discussed the case with Dr.Garba who graciously agreed to admit the patient to their service for continued care.   Clinical Impression:  1. Subdural hematoma (HCC)     The plan for this patient was discussed with Dr. Dasie, who voiced agreement and who oversaw evaluation and  treatment of this patient.   Electronically signed by:   Hamilton Carlin Guillermina, M.D. PGY-2, Emergency Medicine      Guillermina Hamilton, MD 10/10/24 [phone]

## 2024-10-10 NOTE — ED Triage Notes (Addendum)
 89 yo patient bib [facility], lives alone and cares for self, hx of dementia. Last known well 1 PM yesterday, found laying on floor in her underwear today at 1PM. Family noting R sided weakness, R sided facial droop and difficulty with gait. EMS was called 5 PM. Pt is on eliquis . Only alert to person  20 LAC GCS 14 170 cbg 190/110 80  Rr 18 Spo2 96

## 2024-10-10 NOTE — ED Provider Notes (Incomplete)
 " [facility] Provider Note   CSN: [id number] Arrival date & time: 10/10/24  1754     Patient presents with: [place]   Kathryn Strickland is a 89 y.o. female medical history significant for A-fib and dementia presents today after being found down today at 1 PM.  Patient's last known well was 1 PM yesterday.  Patient's family notes right sided weakness and right sided facial droop.  Patient on Eliquis  and only oriented to self.  {Add pertinent medical, surgical, social history, OB history to YEP:67052}  Fall       Prior to Admission medications  Medication Sig Start Date End Date Taking? Authorizing Provider  alendronate (FOSAMAX) 70 MG tablet Take 70 mg by mouth once a week. Monday    [provider]  apixaban  (ELIQUIS ) 2.5 MG TABS tablet Take 1 tablet (2.5 mg total) by mouth 2 (two) times daily. 09/17/24   Aniceto Daphne CROME, NP  B Complex Vitamins (VITAMIN B COMPLEX PO) Take 1 tablet by mouth daily.    [provider]  donepezil  (ARICEPT ) 10 MG tablet Take 10 mg by mouth every morning.    [provider]  enalapril  (VASOTEC ) 10 MG tablet TAKE 1 TABLET BY MOUTH EVERY DAY 11/05/22   Theophilus Andrews, Tully GRADE, MD  hydrochlorothiazide  (HYDRODIURIL ) 12.5 MG tablet TAKE 1 TABLET BY MOUTH EVERY DAY. INSURANCE PAYS FOR 90 DAYS 12/03/22   Theophilus Andrews, Tully GRADE, MD  memantine  (NAMENDA  XR) 7 MG CP24 24 hr capsule Take 7 mg by mouth daily.    [provider]  VITAMIN D  PO Take 5,000 Units by mouth daily.    [provider]    Allergies: Patient has no known allergies.    Review of Systems  Neurological:  Positive for weakness.    Updated Vital Signs BP (!) 171/74 (BP Location: Right Arm)   Temp 97.8 F (36.6 C) (Oral)   Physical Exam Vitals and nursing note reviewed.  Constitutional:      General: She is not in acute distress.    Appearance: She is well-developed. She is not toxic-appearing.   HENT:     Head: Normocephalic and atraumatic.     Right Ear: External ear normal.     Left Ear: External ear normal.  Eyes:     Extraocular Movements: Extraocular movements intact.     Conjunctiva/sclera: Conjunctivae normal.     Pupils: Pupils are equal, round, and reactive to light.  Cardiovascular:     Rate and Rhythm: Normal rate and regular rhythm.     Pulses: Normal pulses.     Heart sounds: Normal heart sounds. No murmur heard. Pulmonary:     Effort: Pulmonary effort is normal. No respiratory distress.     Breath sounds: Normal breath sounds.  Abdominal:     Palpations: Abdomen is soft.     Tenderness: There is no abdominal tenderness.  Musculoskeletal:        General: No swelling.     Cervical back: Neck supple.  Skin:    General: Skin is warm and dry.     Capillary Refill: Capillary refill takes less than 2 seconds.  Neurological:     Mental Status: She is alert.     GCS: GCS eye subscore is 4. GCS verbal subscore is 5. GCS motor subscore is 6.     Cranial Nerves: Facial asymmetry present. No cranial nerve deficit.     Sensory: Sensation is intact.  Motor: Weakness present.     Comments: Right-sided grip strength weaker than left  Psychiatric:        Mood and Affect: Mood normal.     (all labs ordered are listed, but only abnormal results are displayed) Labs Reviewed  PROTIME-INR  APTT  CBC  DIFFERENTIAL  COMPREHENSIVE METABOLIC PANEL WITH GFR  ETHANOL  URINE DRUG SCREEN  CK  CBG MONITORING, ED    EKG: None  Radiology: No results found.  {Document cardiac monitor, telemetry assessment procedure when appropriate:32947} Procedures   Medications Ordered in the ED - No data to display    {Click here for ABCD2, HEART and other calculators REFRESH Note before signing:1}                              Medical Decision Making Amount and/or Complexity of Data Reviewed Labs: ordered. Radiology: ordered.   This patient presents to the ED for  concern of fall, this involves an extensive number of treatment options, and is a complaint that carries with it a high risk of complications and morbidity.  The differential diagnosis includes rhabdomyolysis, brain bleed, stroke, fracture, dislocation, musculoskeletal pain   Co morbidities / Chronic conditions that complicate the patient evaluation  Dementia  Lab Tests:  I Ordered, and personally interpreted labs.  The pertinent results include: Leukocytosis at 11.8, pro time and INR WNL, APTT 30, bun 24, alk phos 146, elevated AST at 70, CK 1360, UDS negative, alcohol negative   Imaging Studies ordered:  I ordered imaging studies including CT angio head and neck and C-spine no charge I independently visualized and interpreted imaging which showed *** I agree with the radiologist interpretation   Cardiac Monitoring: / EKG:  The patient was maintained on a cardiac monitor.  I personally viewed and interpreted the cardiac monitored which showed an underlying rhythm of: ***   Problem List / ED Course / Critical interventions / Medication management  I ordered medication including IVF   I have reviewed the patients home medicines and have made adjustments as needed   Consultations Obtained:  I requested consultation with the ***,  and discussed lab and imaging findings as well as pertinent plan - they recommend: ***  Test / Admission - Considered:  ***   {Document critical care time when appropriate  Document review of labs and clinical decision tools ie CHADS2VASC2, etc  Document your independent review of radiology images and any outside records  Document your discussion with family members, caretakers and with consultants  Document social determinants of health affecting pt's care  Document your decision making why or why not admission, treatments were needed:32947:::1}   Final diagnoses:  None    ED Discharge Orders     None        "

## 2024-10-10 NOTE — Consult Note (Signed)
 Reason for Consult: Subdural hematoma Referring Physician: Emergency department  Kathryn Strickland is an 89 y.o. female.  HPI: 89 year old female with history of cardiac disease status post pacemaker placement with chronic atrial fibrillation.  Patient with long-term anticoagulation on Eliquis .  Patient has been living semiindependently at home but his had progressive decline over the past few weeks.  She has become increasingly unsteady.  She has had a number of ground-level falls.  She was found down on the ground today by family.  She was noted to have decreased movement on her right side and some increasing confusion.  She was brought to the emergency department where she was found to have a large left convexity chronic subdural hematoma with a small amount of layered more acute blood.  Patient is hemodynamically stable.  Discussed situation with the patient's son, he is not sure that they wish to proceed with any more aggressive care and has questions with regard to possible hospice/palliative care.  Past Medical History:  Diagnosis Date   Arthritis    Hips,Knees    Atrial fibrillation (HCC) 04/07/2019   Cardiomyopathy (HCC) 04/20/2019   EF 45% with apical WMA- no history of CAD or symptoms of angina   Carotid bruit    LATERAL UNSPECIFIED   Chronic pain    Complete heart block (HCC) 03/2019   CRI (chronic renal insufficiency), stage 3 (moderate) 04/20/2019   Dementia (HCC)    Short term memory   Genital disorder, female    Hypercalcemia    Hypercholesterolemia    Hyperlipidemia    Hypertension    Osteoporosis    Postmenopausal    Presence of permanent cardiac pacemaker 03/25/2019   for complete heart block   Primary osteoarthritis of right hip 05/18/2019   Vitamin D  deficiency     Past Surgical History:  Procedure Laterality Date   COLONOSCOPY     PACEMAKER IMPLANT N/A 03/25/2019   Procedure: PACEMAKER IMPLANT;  Surgeon: Inocencio Soyla Lunger, MD;  Location: [facility];  Service: Cardiovascular;  Laterality: N/A;   TOTAL HIP ARTHROPLASTY Right 06/09/2019   Procedure: TOTAL HIP ARTHROPLASTY ANTERIOR APPROACH;  Surgeon: Beverley Evalene BIRCH, MD;  Location: [facility];  Service: Orthopedics;  Laterality: Right;   TOTAL HIP ARTHROPLASTY Left 10/20/2019   Procedure: TOTAL HIP ARTHROPLASTY ANTERIOR APPROACH;  Surgeon: Beverley Evalene BIRCH, MD;  Location: [facility];  Service: Orthopedics;  Laterality: Left;    Family History  Problem Relation Age of Onset   Colon cancer Mother 74   Heart disease Father    CAD Father 46   Thyroid  disease Neg Hx     Social History:  reports that she has never smoked. She has never used smokeless tobacco. She reports that she does not drink alcohol and does not use drugs.  Allergies: Allergies[1]  Medications: I have reviewed the patient's current medications.  Results for orders placed or performed during the hospital encounter of 10/10/24 (from the past 48 hours)  Protime-INR     Status: None   Collection Time: 10/10/24  6:06 PM  Result Value Ref Range   Prothrombin  Time 15.0 11.4 - 15.2 seconds   INR 1.1 0.8 - 1.2    Comment: (NOTE) INR goal varies based on device and disease states. Performed at [facility], [address], [place], [place] [address]   APTT     Status: None   Collection Time: 10/10/24  6:06 PM  Result Value Ref Range   aPTT 30  24 - 36 seconds    Comment: Performed at [facility], [address] [place], [place] [address]  CBC     Status: Abnormal   Collection Time: 10/10/24  6:06 PM  Result Value Ref Range   WBC 11.8 (H) 4.0 - 10.5 K/uL   RBC 4.95 3.87 - 5.11 MIL/uL   Hemoglobin 13.6 12.0 - 15.0 g/dL   HCT 57.2 63.9 - 53.9 %   MCV 86.3 80.0 - 100.0 fL   MCH 27.5 26.0 - 34.0 pg   MCHC 31.9 30.0 - 36.0 g/dL   RDW 85.2 88.4 - 84.4 %   Platelets 206 150 - 400 K/uL   nRBC 0.0 0.0 - 0.2 %    Comment: Performed at [facility], [address], [place], [place] [address]  Differential      Status: Abnormal   Collection Time: 10/10/24  6:06 PM  Result Value Ref Range   Neutrophils Relative % 88 %   Neutro Abs 10.4 (H) 1.7 - 7.7 K/uL   Lymphocytes Relative 6 %   Lymphs Abs 0.7 0.7 - 4.0 K/uL   Monocytes Relative 5 %   Monocytes Absolute 0.6 0.1 - 1.0 K/uL   Eosinophils Relative 0 %   Eosinophils Absolute 0.0 0.0 - 0.5 K/uL   Basophils Relative 0 %   Basophils Absolute 0.0 0.0 - 0.1 K/uL   Immature Granulocytes 1 %   Abs Immature Granulocytes 0.07 0.00 - 0.07 K/uL    Comment: Performed at [facility], [address] [place], [place] [address]  Comprehensive metabolic panel     Status: Abnormal   Collection Time: 10/10/24  6:06 PM  Result Value Ref Range   Sodium 137 135 - 145 mmol/L   Potassium 3.7 3.5 - 5.1 mmol/L   Chloride 101 98 - 111 mmol/L   CO2 23 22 - 32 mmol/L   Glucose, Bld 131 (H) 70 - 99 mg/dL    Comment: Glucose reference range applies only to samples taken after fasting for at least 8 hours.   BUN 24 (H) 8 - 23 mg/dL   Creatinine, Ser 9.06 0.44 - 1.00 mg/dL   Calcium 89.7 8.9 - 89.6 mg/dL   Total Protein 6.9 6.5 - 8.1 g/dL   Albumin 3.8 3.5 - 5.0 g/dL   AST 70 (H) 15 - 41 U/L   ALT 32 0 - 44 U/L   Alkaline Phosphatase 146 (H) 38 - 126 U/L   Total Bilirubin 0.9 0.0 - 1.2 mg/dL   GFR, Estimated 58 (L) >60 mL/min    Comment: (NOTE) Calculated using the CKD-EPI Creatinine Equation (2021)    Anion gap 13 5 - 15    Comment: Performed at [facility], [address], [place], [place] [address]  Ethanol     Status: None   Collection Time: 10/10/24  6:06 PM  Result Value Ref Range   Alcohol, Ethyl (B) <15 <15 mg/dL    Comment: (NOTE) For medical purposes only. Performed at [facility], [address] [place], [place] [address]   Urine rapid drug screen (hosp performed)     Status: None   Collection Time: 10/10/24  6:06 PM  Result Value Ref Range   Opiates NEGATIVE NEGATIVE   Cocaine NEGATIVE NEGATIVE   Benzodiazepines  NEGATIVE NEGATIVE   Amphetamines NEGATIVE NEGATIVE   Tetrahydrocannabinol NEGATIVE NEGATIVE   Barbiturates NEGATIVE NEGATIVE   Methadone Scn, Ur NEGATIVE NEGATIVE   Fentanyl   NEGATIVE NEGATIVE    Comment: (NOTE) Drug screen is for Medical Purposes only. Positive results are preliminary only. If confirmation is needed, notify lab within 5 days.  Drug Class                 Cutoff (ng/mL) Amphetamine and metabolites 1000 Barbiturate and metabolites 200 Benzodiazepine              200 Opiates and metabolites     300 Cocaine and metabolites     300 THC                         50 Fentanyl                     5 Methadone                   300  Trazodone is metabolized in vivo to several metabolites,  including pharmacologically active m-CPP, which is excreted in the  urine.  Immunoassay screens for amphetamines and MDMA have potential  cross-reactivity with these compounds and may provide false positive  result.  Performed at [facility], [address] [place], [place] [address]   CK     Status: Abnormal   Collection Time: 10/10/24  6:06 PM  Result Value Ref Range   Total CK 1,360 (H) 38 - 234 U/L    Comment: Performed at [facility], [address], [place], [place] [address]  Urinalysis, Routine w reflex microscopic -Urine, Clean Catch     Status: Abnormal   Collection Time: 10/10/24  8:38 PM  Result Value Ref Range   Color, Urine YELLOW YELLOW   APPearance HAZY (A) CLEAR   Specific Gravity, Urine 1.021 1.005 - 1.030   pH 5.0 5.0 - 8.0   Glucose, UA NEGATIVE NEGATIVE mg/dL   Hgb urine dipstick NEGATIVE NEGATIVE   Bilirubin Urine NEGATIVE NEGATIVE   Ketones, ur 5 (A) NEGATIVE mg/dL   Protein, ur 899 (A) NEGATIVE mg/dL   Nitrite NEGATIVE NEGATIVE   Leukocytes,Ua NEGATIVE NEGATIVE   RBC / HPF 0-5 0 - 5 RBC/hpf   WBC, UA 0-5 0 - 5 WBC/hpf   Bacteria, UA NONE SEEN NONE SEEN   Squamous Epithelial / HPF 0-5 0 - 5 /HPF    Comment: Performed at [facility], [address], [place], [place] [address]    CT ANGIO HEAD NECK W WO CM Result Date: 10/10/2024 EXAM: CTA Head and Neck with Intravenous Contrast. CT Head without Contrast. CLINICAL HISTORY: Neuro deficit, acute, stroke suspected. TECHNIQUE: Axial CTA images of the head and neck performed with and without intravenous contrast. MIP reconstructed images were created and reviewed. Axial computed tomography images of the head/brain performed without intravenous contrast. Note: Per PQRS, the description of internal carotid artery percent stenosis, including 0 percent or normal exam, is based on North American Symptomatic Carotid Endarterectomy Trial (NASCET) criteria. Dose reduction technique was used including one or more of the following: automated exposure control, adjustment of mA and kV according to patient size, and/or iterative reconstruction. CONTRAST: With and without; 75 mL iohexol  (OMNIPAQUE ) 350 MG/ML injection. COMPARISON: CT Head and Neck dated 08/23/2023. FINDINGS: CT HEAD: BRAIN: There is a large left convexity subdural hematoma with a hyperdense component layering dependently. The hematoma measures 2.6 cm in thickness and causes rightward midline shift of 6 mm. There is marked mass effect on the left hemisphere. No acute  intraparenchymal hemorrhage. No mass lesion. No CT evidence for acute territorial infarct. VENTRICLES: Narrowing of the left lateral ventricle. No hydrocephalus. ORBITS: The orbits are unremarkable. SINUSES AND MASTOIDS: The paranasal sinuses and mastoid air cells are clear. CTA NECK: COMMON CAROTID ARTERIES: Calcific aortic atherosclerosis. No significant stenosis. No dissection or occlusion. 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 Undulating contour of the distal internal carotid arteries is consistent with fibromuscular dysplasia. No dissection or occlusion. VERTEBRAL ARTERIES: No  significant stenosis. No dissection or occlusion. CTA HEAD: ANTERIOR CEREBRAL ARTERIES: No significant stenosis. No occlusion. No aneurysm. MIDDLE CEREBRAL ARTERIES: No significant stenosis. No occlusion. No aneurysm. POSTERIOR CEREBRAL ARTERIES: No significant stenosis. No occlusion. No aneurysm. BASILAR ARTERY: No significant stenosis. No occlusion. No aneurysm. OTHER: SOFT TISSUES: 11 mm calcified nodule in the left thyroid  lobe. 17 mm hypodense nodule in the right thyroid  lobe. BONES: No acute osseous abnormality. IMPRESSION: 1. Large left convexity subdural hematoma with a hyperdense component, measuring 2.6 cm in thickness, causing rightward midline shift of 6 mm and marked mass effect on the left hemisphere with narrowing of the left lateral ventricle. 2. No emergent large vessel occlusion. 3. 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 4. Undulating contour of the distal internal carotid arteries, consistent with fibromuscular dysplasia. 5. 17 mm right thyroid  lobe nodule, and non-emergent thyroid  ultrasound is recommended. Findings communicated to Dr. Dasie at 9:37PM on 10/10/24. Electronically signed by: Franky Stanford MD MD 10/10/2024 09:39 PM EST RP Workstation: [id number]   CT C-SPINE NO CHARGE Result Date: 10/10/2024 EXAM: CT CERVICAL SPINE WITH CONTRAST 10/10/2024 09:12:10 PM TECHNIQUE: CT of the cervical spine was performed with the administration of 75 mL of iohexol  (OMNIPAQUE ) 350 MG/ML intravenous contrast. Multiplanar reformatted images are provided for review. Automated exposure control, iterative reconstruction, and/or weight based adjustment of the mA/kV was utilized to reduce the radiation dose to as low as reasonably achievable. COMPARISON: None available. CLINICAL HISTORY: FINDINGS: BONES AND ALIGNMENT: No acute fracture or traumatic malalignment. DEGENERATIVE CHANGES: Mid cervical degenerative disc disease at C4-C6. No spinal canal  stenosis. SOFT TISSUES: No prevertebral soft tissue swelling. IMPRESSION: 1. Mid cervical degenerative disc disease at C4-C6 without spinal canal stenosis. Electronically signed by: Franky Stanford MD MD 10/10/2024 09:33 PM EST RP Workstation: [id number]    Review of systems not obtained due to patient factors. Blood pressure (!) 158/82, pulse 78, temperature 97.8 F (36.6 C), temperature source Oral, resp. rate (!) 22, height 5' 5 (1.651 m), weight 56.7 kg, SpO2 96%. Patient is awake and aware.  She is oriented to person but not place or time.  Her speech is reasonably fluent.  Pupils are equal and reactive bilaterally.  Extraocular movements are full.  She has right facial weakness.  She has weakness of her right upper extremity grading out 3/5.  Left upper extremity strength is 5/5.  Left lower extremity strength is 4/5.  Right lower extremity strength 5/5.  Examination head ears eyes nose and throat is atraumatic without cranial abnormality.  Oropharynx, nasopharynx and external auditory canals are clear.  Chest and abdomen are benign.  Assessment/Plan: Large convexity subdural hematoma with mostly chronic subdural hematoma with a small amount of layered posterior hemorrhage.  Significant mass effect.  I discussed situation with patient's son who is her medical power of attorney.  Have discussed treatment options including palliative care versus operative intervention  with bur hole evacuation of subdural hematoma versus possible middle meningeal artery embolization.  He does not wish to consider surgical intervention at present.  He is considering the possibility of middle meningeal artery embolization which could be done sometime next week.  At present he is leaning more towards palliative care.  At this point I think the patient may be admitted to the medicine service.  She may have a diet as tolerated.  Her activity should be ad lib.SABRA  Recommend palliative medicine consult.  I will follow back with the  patient tomorrow to discuss any questions they may have.  Victory A Annaleigh Steinmeyer 10/10/2024, 11:22 PM         [1] No Known Allergies

## 2024-10-11 DIAGNOSIS — S065XAA Traumatic subdural hemorrhage with loss of consciousness status unknown, initial encounter: Secondary | ICD-10-CM | POA: Diagnosis not present

## 2024-10-11 MED ORDER — MORPHINE SULFATE (PF) 2 MG/ML IV SOLN: 2.0000 mg | INTRAVENOUS | Status: DC | PRN

## 2024-10-11 MED ORDER — SODIUM CHLORIDE 0.9 % IV SOLN: INTRAVENOUS | Status: DC

## 2024-10-11 MED ORDER — ONDANSETRON HCL 4 MG PO TABS: 4.0000 mg | ORAL_TABLET | Freq: Four times a day (QID) | ORAL | Status: DC | PRN

## 2024-10-11 MED ORDER — ONDANSETRON HCL 4 MG/2ML IJ SOLN: 4.0000 mg | Freq: Four times a day (QID) | INTRAMUSCULAR | Status: DC | PRN

## 2024-10-11 MED ORDER — MEMANTINE HCL ER 7 MG PO CP24
7.0000 mg | ORAL_CAPSULE | Freq: Every day | ORAL | Status: DC
  Administered 2024-10-11 – 2024-10-13 (×3): 7 mg via ORAL
  Filled 2024-10-11 (×3): qty 1

## 2024-10-11 MED ORDER — SODIUM CHLORIDE 0.45 % IV SOLN: INTRAVENOUS | Status: DC

## 2024-10-11 MED ORDER — DONEPEZIL HCL 10 MG PO TABS
10.0000 mg | ORAL_TABLET | Freq: Every morning | ORAL | Status: DC
  Administered 2024-10-11 – 2024-10-13 (×3): 10 mg via ORAL
  Filled 2024-10-11 (×3): qty 1

## 2024-10-11 MED ORDER — METHIMAZOLE 5 MG PO TABS
5.0000 mg | ORAL_TABLET | Freq: Every day | ORAL | Status: DC
  Administered 2024-10-11 – 2024-10-13 (×3): 5 mg via ORAL
  Filled 2024-10-11 (×3): qty 1

## 2024-10-11 NOTE — Progress Notes (Signed)
 " PROGRESS NOTE    Kathryn Strickland  FMW:[id number] DOB: 1933-03-19 DOA: 10/10/2024 PCP: Xenia Beryl BIRCH, MD   Brief Narrative: 89 year old with past medical history significant for A-fib on chronic anticoagulation, cardiomyopathy, dementia, chronic kidney disease stage III, hyperlipidemia, essential hypertension, hypothyroidism, history of complete heart block who lives alone at home and is able to take care of herself.  Patient apparently had a fall sometimes between the day prior to admission or the day of admission last known well at 1 PM today prior to admission.  Family noted right-sided weakness, facial droop difficulty with her gait.  EMS contacted.  Patient was awake but unable to communicate.  Evaluation in the ED consistent with a large subdural hematoma left convexity region with midline shift.  Neurosurgery consulted.  Family debating nonoperative course.   Assessment & Plan:   Principal Problem:   Subdural hematoma (HCC) Active Problems:   Hypertension   Complete heart block (HCC)   Cardiomyopathy (HCC)   CRI (chronic renal insufficiency), stage 3 (moderate)   Atrial fibrillation (HCC)   Hyperthyroidism   1-Large left-sided subdural hematoma: -No plan for surgical intervention, family leaning toward not proceeding with middle meningeal artery embolization. Neurosurgery following.  -Discussed with Son, patient wouldn't want to be resuscitated. DNR/DNI.  -plan to proceed with palliative care consult.  -Speech consult, son is ok with fluids if needed.  - Patient received Kcentra  on admission  A-fib: -Eliquis  has been discontinue.  -rate is controlled.   Dementia: -Per son patient has been having short term memory problems since 2020 -Delirium precaution.  -Resume meds: Namenda  and Aricept   Essential hypertension: -Holding diuretics, lisinopril.  If BP increases , can start norvasc.   S/ post fall -CK mildly elevated. Started fluids.   History Ischemic  cardiomyopathy: -compensated. Last EF 60 % 2020  Chronic kidney disease stage IIIb -monitor renal function.    Estimated body mass index is 20.8 kg/m as calculated from the following:   Height as of this encounter: 5' 5 (1.651 m).   Weight as of this encounter: 56.7 kg.   DVT prophylaxis: SCDs Code Status: DNR, discussed with son Family Communication: Care discussed with son Disposition Plan:  Status is: Inpatient Remains inpatient appropriate because: Management of subdural    Consultants:  Neurosurgery  Procedures:  None  Antimicrobials:    Subjective: Patient is alert, speech is dysarthric but able to understand, has some right-sided weakness  Objective: Vitals:   10/11/24 0530 10/11/24 0545 10/11/24 0606 10/11/24 0715  BP: (!) 149/69 (!) 140/107  137/62  Pulse: 73 72  72  Resp: 19 (!) 21  20  Temp:   98.3 F (36.8 C)   TempSrc:   Oral   SpO2: 100% 100%  99%  Weight:      Height:        Intake/Output Summary (Last 24 hours) at 10/11/2024 0827 Last data filed at 10/11/2024 0736 Gross per 24 hour  Intake 15.97 ml  Output --  Net 15.97 ml   Filed Weights   10/10/24 1822  Weight: 56.7 kg    Examination:  General exam: Appears calm and comfortable  Respiratory system: Clear to auscultation. Respiratory effort normal. Cardiovascular system: S1 & S2 heard, RRR. No JVD, murmurs, rubs, gallops or clicks. No pedal edema. Gastrointestinal system: Abdomen is nondistended, soft and nontender. No organomegaly or masses felt. Normal bowel sounds heard. Central nervous system: Alert and oriented.  Follows some commands, right-sided weakness. Extremities: Symmetric 5 x 5 power.  Data Reviewed: I have personally reviewed following labs and imaging studies  CBC: Recent Labs  Lab 10/10/24 1806  WBC 11.8*  NEUTROABS 10.4*  HGB 13.6  HCT 42.7  MCV 86.3  PLT 206   Basic Metabolic Panel: Recent Labs  Lab 10/10/24 1806  NA 137  K 3.7  CL 101  CO2  23  GLUCOSE 131*  BUN 24*  CREATININE 0.93  CALCIUM 10.2   GFR: Estimated Creatinine Clearance: 36.7 mL/min (by C-G formula based on SCr of 0.93 mg/dL). Liver Function Tests: Recent Labs  Lab 10/10/24 1806  AST 70*  ALT 32  ALKPHOS 146*  BILITOT 0.9  PROT 6.9  ALBUMIN 3.8   No results for input(s): LIPASE, AMYLASE in the last 168 hours. No results for input(s): AMMONIA in the last 168 hours. Coagulation Profile: Recent Labs  Lab 10/10/24 1806  INR 1.1   Cardiac Enzymes: Recent Labs  Lab 10/10/24 1806  CKTOTAL 1,360*   BNP (last 3 results) No results for input(s): PROBNP in the last 8760 hours. HbA1C: No results for input(s): HGBA1C in the last 72 hours. CBG: No results for input(s): GLUCAP in the last 168 hours. Lipid Profile: No results for input(s): CHOL, HDL, LDLCALC, TRIG, CHOLHDL, LDLDIRECT in the last 72 hours. Thyroid  Function Tests: No results for input(s): TSH, T4TOTAL, FREET4, T3FREE, THYROIDAB in the last 72 hours. Anemia Panel: No results for input(s): VITAMINB12, FOLATE, FERRITIN, TIBC, IRON, RETICCTPCT in the last 72 hours. Sepsis Labs: No results for input(s): PROCALCITON, LATICACIDVEN in the last 168 hours.  No results found for this or any previous visit (from the past 240 hours).       Radiology Studies: CT ANGIO HEAD NECK W WO CM Result Date: 10/10/2024 EXAM: CTA Head and Neck with Intravenous Contrast. CT Head without Contrast. CLINICAL HISTORY: Neuro deficit, acute, stroke suspected. TECHNIQUE: Axial CTA images of the head and neck performed with and without intravenous contrast. MIP reconstructed images were created and reviewed. Axial computed tomography images of the head/brain performed without intravenous contrast. Note: Per PQRS, the description of internal carotid artery percent stenosis, including 0 percent or normal exam, is based on North American Symptomatic Carotid Endarterectomy  Trial (NASCET) criteria. Dose reduction technique was used including one or more of the following: automated exposure control, adjustment of mA and kV according to patient size, and/or iterative reconstruction. CONTRAST: With and without; 75 mL iohexol  (OMNIPAQUE ) 350 MG/ML injection. COMPARISON: CT Head and Neck dated 08/23/2023. FINDINGS: CT HEAD: BRAIN: There is a large left convexity subdural hematoma with a hyperdense component layering dependently. The hematoma measures 2.6 cm in thickness and causes rightward midline shift of 6 mm. There is marked mass effect on the left hemisphere. No acute intraparenchymal hemorrhage. No mass lesion. No CT evidence for acute territorial infarct. VENTRICLES: Narrowing of the left lateral ventricle. No hydrocephalus. ORBITS: The orbits are unremarkable. SINUSES AND MASTOIDS: The paranasal sinuses and mastoid air cells are clear. CTA NECK: COMMON CAROTID ARTERIES: Calcific aortic atherosclerosis. No significant stenosis. No dissection or occlusion. 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 Undulating contour of the distal internal carotid arteries is consistent with fibromuscular dysplasia. No dissection or occlusion. VERTEBRAL ARTERIES: No significant stenosis. No dissection or occlusion. CTA HEAD: ANTERIOR CEREBRAL ARTERIES: No significant stenosis. No occlusion. No aneurysm. MIDDLE CEREBRAL ARTERIES: No significant stenosis. No occlusion. No aneurysm. POSTERIOR CEREBRAL ARTERIES: No significant stenosis. No occlusion. No aneurysm.  BASILAR ARTERY: No significant stenosis. No occlusion. No aneurysm. OTHER: SOFT TISSUES: 11 mm calcified nodule in the left thyroid  lobe. 17 mm hypodense nodule in the right thyroid  lobe. BONES: No acute osseous abnormality. IMPRESSION: 1. Large left convexity subdural hematoma with a hyperdense component, measuring 2.6 cm in thickness, causing rightward midline  shift of 6 mm and marked mass effect on the left hemisphere with narrowing of the left lateral ventricle. 2. No emergent large vessel occlusion. 3. 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 4. Undulating contour of the distal internal carotid arteries, consistent with fibromuscular dysplasia. 5. 17 mm right thyroid  lobe nodule, and non-emergent thyroid  ultrasound is recommended. Findings communicated to Dr. Dasie at 9:37PM on 10/10/24. Electronically signed by: Franky Stanford MD MD 10/10/2024 09:39 PM EST RP Workstation: [id number]   CT C-SPINE NO CHARGE Result Date: 10/10/2024 EXAM: CT CERVICAL SPINE WITH CONTRAST 10/10/2024 09:12:10 PM TECHNIQUE: CT of the cervical spine was performed with the administration of 75 mL of iohexol  (OMNIPAQUE ) 350 MG/ML intravenous contrast. Multiplanar reformatted images are provided for review. Automated exposure control, iterative reconstruction, and/or weight based adjustment of the mA/kV was utilized to reduce the radiation dose to as low as reasonably achievable. COMPARISON: None available. CLINICAL HISTORY: FINDINGS: BONES AND ALIGNMENT: No acute fracture or traumatic malalignment. DEGENERATIVE CHANGES: Mid cervical degenerative disc disease at C4-C6. No spinal canal stenosis. SOFT TISSUES: No prevertebral soft tissue swelling. IMPRESSION: 1. Mid cervical degenerative disc disease at C4-C6 without spinal canal stenosis. Electronically signed by: Franky Stanford MD MD 10/10/2024 09:33 PM EST RP Workstation: [id number]        Scheduled Meds: Continuous Infusions:   LOS: 1 day    Time spent: 35 Minutes    Novah Goza DELENA Lore, MD [facility]   If 7PM-7AM, please contact night-coverage [email or web address]  10/11/2024, 8:27 AM   "

## 2024-10-11 NOTE — H&P (Signed)
 " History and Physical    Patient: Kathryn Strickland FMW:[id number] DOB: 22-Jul-1933 DOA: 10/10/2024 DOS: the patient was seen and examined on 10/11/2024 PCP: Xenia Beryl BIRCH, MD  Patient coming from: Home  Chief Complaint:  Chief Complaint  Patient presents with   Fall   HPI: Kathryn Strickland is a 89 y.o. female with medical history significant of atrial fibrillation on chronic anticoagulation, cardiomyopathy, dementia, chronic kidney disease stage III, hyperlipidemia, essential hypertension, hypothyroidism, history of complete heart block who lives alone at home and takes care of herself.  Patient apparently had a fall sometime between yesterday and today.  Her last known well was 1 PM yesterday.  Today she was found in her underwear around 1 PM on the floor.  Family noted right-sided weakness right-sided facial droop and difficulty with her gait.  They called EMS around 5:00 and brought patient to the ER.  Patient is awake and alert to person but is not communicative.  Workup shows a large subdural hematoma on the left convexity region with midline shift.  Neurosurgery consulted.  Family debating nonoperative course.  Medicine consulted to admit the patient while family make of their minds.  Review of Systems: As mentioned in the history of present illness. All other systems reviewed and are negative. Past Medical History:  Diagnosis Date   Arthritis    Hips,Knees    Atrial fibrillation (HCC) 04/07/2019   Cardiomyopathy (HCC) 04/20/2019   EF 45% with apical WMA- no history of CAD or symptoms of angina   Carotid bruit    LATERAL UNSPECIFIED   Chronic pain    Complete heart block (HCC) 03/2019   CRI (chronic renal insufficiency), stage 3 (moderate) 04/20/2019   Dementia (HCC)    Short term memory   Genital disorder, female    Hypercalcemia    Hypercholesterolemia    Hyperlipidemia    Hypertension    Osteoporosis    Postmenopausal    Presence of permanent cardiac pacemaker  03/25/2019   for complete heart block   Primary osteoarthritis of right hip 05/18/2019   Vitamin D  deficiency    Past Surgical History:  Procedure Laterality Date   COLONOSCOPY     PACEMAKER IMPLANT N/A 03/25/2019   Procedure: PACEMAKER IMPLANT;  Surgeon: Inocencio Soyla Lunger, MD;  Location: [facility];  Service: Cardiovascular;  Laterality: N/A;   TOTAL HIP ARTHROPLASTY Right 06/09/2019   Procedure: TOTAL HIP ARTHROPLASTY ANTERIOR APPROACH;  Surgeon: Beverley Evalene BIRCH, MD;  Location: [facility];  Service: Orthopedics;  Laterality: Right;   TOTAL HIP ARTHROPLASTY Left 10/20/2019   Procedure: TOTAL HIP ARTHROPLASTY ANTERIOR APPROACH;  Surgeon: Beverley Evalene BIRCH, MD;  Location: [facility];  Service: Orthopedics;  Laterality: Left;   Social History:  reports that she has never smoked. She has never used smokeless tobacco. She reports that she does not drink alcohol and does not use drugs.  Allergies[1]  Family History  Problem Relation Age of Onset   Colon cancer Mother 9   Heart disease Father    CAD Father 55   Thyroid  disease Neg Hx     Prior to Admission medications  Medication Sig Start Date End Date Taking? Authorizing Provider  alendronate (FOSAMAX) 70 MG tablet Take 70 mg by mouth once a week. Monday    [provider]  apixaban  (ELIQUIS ) 2.5 MG TABS tablet Take 1 tablet (2.5 mg total) by mouth 2 (two) times daily. 09/17/24   Aniceto Daphne CROME, NP  B Complex Vitamins (VITAMIN  B COMPLEX PO) Take 1 tablet by mouth daily.    [provider]  donepezil  (ARICEPT ) 10 MG tablet Take 10 mg by mouth every morning.    [provider]  enalapril  (VASOTEC ) 10 MG tablet TAKE 1 TABLET BY MOUTH EVERY DAY 11/05/22   Theophilus Andrews, Tully GRADE, MD  hydrochlorothiazide  (HYDRODIURIL ) 12.5 MG tablet TAKE 1 TABLET BY MOUTH EVERY DAY. INSURANCE PAYS FOR 90 DAYS 12/03/22   Theophilus Andrews, Tully GRADE, MD  memantine  (NAMENDA  XR) 7 MG CP24 24 hr capsule Take 7 mg by mouth daily.     [provider]  VITAMIN D  PO Take 5,000 Units by mouth daily.    [provider]    Physical Exam: Vitals:   10/10/24 2233 10/10/24 2245 10/10/24 2250 10/10/24 2356  BP: (!) 147/79 (!) 158/82 (!) 158/82   Pulse:   78   Resp: (!) 25 (!) 23 (!) 22   Temp:    98.4 F (36.9 C)  TempSrc:    Oral  SpO2:   96%   Weight:      Height:       Constitutional: Confused, laying in bed, NAD, calm, comfortable Eyes: PERRL, lids and conjunctivae normal ENMT: Mucous membranes are moist. Posterior pharynx clear of any exudate or lesions.Normal dentition.  Neck: normal, supple, no masses, no thyromegaly Respiratory: clear to auscultation bilaterally, no wheezing, no crackles. Normal respiratory effort. No accessory muscle use.  Cardiovascular: Regular rate and rhythm, no murmurs / rubs / gallops. No extremity edema. 2+ pedal pulses. No carotid bruits.  Abdomen: no tenderness, no masses palpated. No hepatosplenomegaly. Bowel sounds positive.  Musculoskeletal: Good range of motion, no joint swelling or tenderness, Skin: no rashes, lesions, ulcers. No induration Neurologic: CN 2-12 grossly intact. Sensation intact, DTR normal.  Right hemiparesis power 2 out of 5 in upper and lower extremities in all 4.  Psychiatric: Awake and alert x 1, otherwise confused  Data Reviewed:  Blood pressure 170/74, pulse 84, glucose 131 BUN 24.  Alkaline phos bili 146 AST 70.  CK1 360 white count 11.8.  Urinalysis essentially negative.  Urine drug screen negative.  CT angiogram of the head shows a large left convexity subdural hematoma with a hyperdense component measuring 2.6 cm in thickness.  This caused a rightward midline shift of 6 mm and marked mass effect on the left hemisphere with narrowing of the left lateral ventricle.  No large vessel occlusion.  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  Patient is symptomatic with right-sided hemiparesis.  Neurosurgery on board.  Poor prognosis.  Family to make decision regarding CODE STATUS as well as degree of care to be given.  In the meantime we will admit to progressive bed continue frequent neurochecks.  Anticoagulation reversal has happened in the ER.  #2 atrial fibrillation: Discontinue Eliquis .  Rate also controlled  #3 dementia: Patient confused now.  Probably worse than baseline  #4 essential hypertension: Holding blood pressure medications.  May need to use IVs if needed  #5 status post fall: CK is about 1400.  Gentle hydration  #6 ischemic cardiomyopathy: Appears compensated  #7 chronic kidney disease stage III: At baseline     Advance Care Planning:   Code Status: Full Code   Consults: Dr. Malcolm, neurosurgery  Family Communication: Multiple family members at bedside  Severity of Illness: The appropriate patient status  for this patient is INPATIENT. Inpatient status is judged to be reasonable and necessary in order to provide the required intensity of service to ensure the patient's safety. The patient's presenting symptoms, physical exam findings, and initial radiographic and laboratory data in the context of their chronic comorbidities is felt to place them at high risk for further clinical deterioration. Furthermore, it is not anticipated that the patient will be medically stable for discharge from the hospital within 2 midnights of admission.   * I certify that at the point of admission it is my clinical judgment that the patient will require inpatient hospital care spanning beyond 2 midnights from the point of admission due to high intensity of service, high risk for further deterioration and high frequency of surveillance required.*  AuthorBETHA SIM KNOLL, MD 10/11/2024 12:09 AM  For on call review [email or web address].     [1] No Known Allergies  "

## 2024-10-11 NOTE — TOC CAGE-AID Note (Signed)
 Transition of Care [facility]) - CAGE-AID Screening  Patient Details  Name: Kathryn Strickland MRN: [id number] Date of Birth: 09/14/1933  Clinical Narrative:  Oriented only to person, unable to participate in substance abuse screening at this time.  CAGE-AID Screening: Substance Abuse Screening unable to be completed due to: : Patient unable to participate

## 2024-10-11 NOTE — Progress Notes (Signed)
 Pt wedding ring gold band ring was given to and taken home by pts son Redell.   Volanda Beverley BIRCH, RN

## 2024-10-11 NOTE — Evaluation (Signed)
 Clinical/Bedside Swallow Evaluation Patient Details  Name: Kathryn Strickland MRN: [id number] Date of Birth: 1932-11-24  Today's Date: 10/11/2024 Time: SLP Start Time (ACUTE ONLY): 1244 SLP Stop Time (ACUTE ONLY): 1305 SLP Time Calculation (min) (ACUTE ONLY): 21 min  Past Medical History:  Past Medical History:  Diagnosis Date   Arthritis    Hips,Knees    Atrial fibrillation (HCC) 04/07/2019   Cardiomyopathy (HCC) 04/20/2019   EF 45% with apical WMA- no history of CAD or symptoms of angina   Carotid bruit    LATERAL UNSPECIFIED   Chronic pain    Complete heart block (HCC) 03/2019   CRI (chronic renal insufficiency), stage 3 (moderate) 04/20/2019   Dementia (HCC)    Short term memory   Genital disorder, female    Hypercalcemia    Hypercholesterolemia    Hyperlipidemia    Hypertension    Osteoporosis    Postmenopausal    Presence of permanent cardiac pacemaker 03/25/2019   for complete heart block   Primary osteoarthritis of right hip 05/18/2019   Vitamin D  deficiency    Past Surgical History:  Past Surgical History:  Procedure Laterality Date   COLONOSCOPY     PACEMAKER IMPLANT N/A 03/25/2019   Procedure: PACEMAKER IMPLANT;  Surgeon: Inocencio Soyla Lunger, MD;  Location: [facility];  Service: Cardiovascular;  Laterality: N/A;   TOTAL HIP ARTHROPLASTY Right 06/09/2019   Procedure: TOTAL HIP ARTHROPLASTY ANTERIOR APPROACH;  Surgeon: Beverley Evalene BIRCH, MD;  Location: [facility];  Service: Orthopedics;  Laterality: Right;   TOTAL HIP ARTHROPLASTY Left 10/20/2019   Procedure: TOTAL HIP ARTHROPLASTY ANTERIOR APPROACH;  Surgeon: Beverley Evalene BIRCH, MD;  Location: [facility];  Service: Orthopedics;  Laterality: Left;   HPI:  Kathryn Strickland is an 89 y.o. female who presented to the hospital from home on 10/10/24  (lives alone and independent with ADL's)  after being found down at home by family. Family also noted right sided facial droop and difficulty with her gait. Workup in ED showed  a large SDH on the left convexity region with midline shift. Neurosurgery consulted and following but family considering more of a comfort/palliative plan of care. She has been NPO and SLP swallow evaluation ordered. Son indicated that on 1/11 at 1115, she exhibited a significant improvement in her alertness, cogntion, communication but not back to baseline. PMH: dementia, HTN, a-fib, osteoporosis.    Assessment / Plan / Recommendation  Clinical Impression  Plan: SLP recommending initiate PO diet of clear liquids/thin consistency and will determine need for MBS next date.  Patient presents with clinical s/s of dysphagia as per this bedside swallow characterized by delayed coughing with mixed consistencies (diced pears in syrup), delayed mastication of solids (partly due to poor dentition per family). Delay of coughing was a few minutes after initially appearing to tolerate PO's well. No overt s/s aspiration with isolated straw sips of thin liquids.  SLP Visit Diagnosis: Dysphagia, unspecified (R13.10)    Aspiration Risk  Mild aspiration risk    Diet Recommendation Thin liquid    Medication Administration: Other (Comment) (whole or crushed in puree) Supervision: Patient able to self feed;Full supervision/cueing for compensatory strategies;Staff to assist with self feeding Compensations: Slow rate;Small sips/bites Postural Changes: Seated upright at 90 degrees    Other Recommendations Oral Care Recommendations: Oral care BID     Swallow Evaluation Recommendations     Assistance Recommended at Discharge    Functional Status Assessment Patient has had a recent decline in their functional  status and demonstrates the ability to make significant improvements in function in a reasonable and predictable amount of time.  Frequency and Duration min 2x/week  1 week       Prognosis Prognosis for improved oropharyngeal function: Good      Swallow Study   General Date of Onset: 10/10/24 HPI:  Kathryn Strickland is an 89 y.o. female who presented to the hospital from home on 10/10/24  (lives alone and independent with ADL's)  after being found down at home by family. Family also noted right sided facial droop and difficulty with her gait. Workup in ED showed a large SDH on the left convexity region with midline shift. Neurosurgery consulted and following but family considering more of a comfort/palliative plan of care. She has been NPO and SLP swallow evaluation ordered. Son indicated that on 1/11 at 1115, she exhibited a significant improvement in her alertness, cogntion, communication but not back to baseline. PMH: dementia, HTN, a-fib, osteoporosis. Type of Study: Bedside Swallow Evaluation Previous Swallow Assessment: none found Diet Prior to this Study: NPO Temperature Spikes Noted: No Respiratory Status: Room air History of Recent Intubation: No Behavior/Cognition: Alert;Cooperative;Pleasant mood Oral Cavity Assessment: Within Functional Limits Oral Care Completed by SLP: No Oral Cavity - Dentition: Poor condition;Missing dentition Self-Feeding Abilities: Needs set up;Needs assist Patient Positioning: Upright in bed Baseline Vocal Quality: Low vocal intensity Volitional Cough: Cognitively unable to elicit Volitional Swallow: Unable to elicit    Oral/Motor/Sensory Function Overall Oral Motor/Sensory Function: Within functional limits   Ice Chips     Thin Liquid Thin Liquid: Impaired Presentation: Straw;Self Fed Pharyngeal  Phase Impairments: Multiple swallows    Nectar Thick     Honey Thick     Puree Puree: Not tested   Solid     Solid: Impaired Presentation: Spoon Oral Phase Impairments: Impaired mastication Pharyngeal Phase Impairments: Cough - Delayed      [name], MA, CCC-SLP Speech Therapy  10/11/2024,1:46 PM

## 2024-10-11 NOTE — Progress Notes (Signed)
 No significant change in status.  Patient denies significant headache.  She awakens easily.  She is oriented to person but not time or place.  Her speech is fluent.  She has some very minimal right sided weakness.  I have discussed things further with the patient's family.  They are leaning toward more comfort/palliative care.  Will revisit possible middle meningeal artery embolization over the next couple days to make sure there is nothing else they want us  to consider but right now no indication for urgent surgical intervention.

## 2024-10-11 NOTE — ED Notes (Signed)
Report [facility] to [facility] on 3[facility]

## 2024-10-12 DIAGNOSIS — Z515 Encounter for palliative care: Secondary | ICD-10-CM | POA: Diagnosis not present

## 2024-10-12 DIAGNOSIS — S065XAA Traumatic subdural hemorrhage with loss of consciousness status unknown, initial encounter: Secondary | ICD-10-CM | POA: Diagnosis not present

## 2024-10-12 LAB — CBC
HCT: 35.6 % — ABNORMAL LOW (ref 36.0–46.0)
Hemoglobin: 11.8 g/dL — ABNORMAL LOW (ref 12.0–15.0)
MCH: 28 pg (ref 26.0–34.0)
MCHC: 33.1 g/dL (ref 30.0–36.0)
MCV: 84.6 fL (ref 80.0–100.0)
Platelets: 152 K/uL (ref 150–400)
RBC: 4.21 MIL/uL (ref 3.87–5.11)
RDW: 14.7 % (ref 11.5–15.5)
WBC: 7.7 K/uL (ref 4.0–10.5)
nRBC: 0 % (ref 0.0–0.2)

## 2024-10-12 LAB — BASIC METABOLIC PANEL WITH GFR
Anion gap: 9 (ref 5–15)
BUN: 16 mg/dL (ref 8–23)
CO2: 23 mmol/L (ref 22–32)
Calcium: 8.9 mg/dL (ref 8.9–10.3)
Chloride: 108 mmol/L (ref 98–111)
Creatinine, Ser: 0.83 mg/dL (ref 0.44–1.00)
GFR, Estimated: >60 mL/min
Glucose, Bld: 103 mg/dL — ABNORMAL HIGH (ref 70–99)
Potassium: 3.5 mmol/L (ref 3.5–5.1)
Sodium: 140 mmol/L (ref 135–145)

## 2024-10-12 MED ORDER — BOOST / RESOURCE BREEZE PO LIQD CUSTOM
1.0000 | Freq: Three times a day (TID) | ORAL | Status: DC
  Administered 2024-10-13: 1 via ORAL

## 2024-10-12 NOTE — Plan of Care (Signed)
   Problem: Activity: Goal: Risk for activity intolerance will decrease Outcome: Progressing   Problem: Nutrition: Goal: Adequate nutrition will be maintained Outcome: Progressing

## 2024-10-12 NOTE — Progress Notes (Signed)
 Speech Language Pathology Treatment: Dysphagia  Patient Details Name: Kathryn Strickland MRN: [id number] DOB: September 09, 1933 Today's Date: 10/12/2024 Time: 8579-8554 SLP Time Calculation (min) (ACUTE ONLY): 25 min  Assessment / Plan / Recommendation Clinical Impression  Kathryn Strickland continues to present with concerns for pharyngeal dysphagia characterized by multiple swallows (5+) with each sip of thin liquid or bite of applesauce, immediate coughing following ~25% of applesauce trials or thin liquid washes that followed applesauce trials, and delayed, prolonged coughing. [name] cough is weak and appears somewhat strangled at times. There is a concern for both airway violation and possible backflow.   SLP discussed options for instrumental swallow studies if in alignment with pt's goals of care. [name] son agreeable to option of MBSS to objectively assess swallow function and offer guidance on safest, least restrictive diet.   Recommend continue clear liquid, thin liquid diet at this time. MBSS anticipated tomorrow, 10/13/24. Continue SLP Poc.    HPI HPI: Kathryn Strickland is an 89 y.o. female who presented to the hospital from home on 10/10/24  (lives alone and independent with ADL's)  after being found down at home by family. Family also noted right sided facial droop and difficulty with her gait. Workup in ED showed a large SDH on the left convexity region with midline shift. Neurosurgery consulted and following but family considering more of a comfort/palliative plan of care. She has been NPO and SLP swallow evaluation ordered. Son indicated that on 1/11 at 1115, she exhibited a significant improvement in her alertness, cogntion, communication but not back to baseline. PMH: dementia, HTN, a-fib, osteoporosis.      SLP Plan  Continue with current plan of care;MBS        Recommendations  Diet recommendations: Thin liquid (clear liquids) Liquids provided via: Straw Medication Administration:  Crushed with puree Supervision: Trained caregiver to feed patient;Staff to assist with self feeding Compensations: Slow rate;Small sips/bites Postural Changes and/or Swallow Maneuvers: Seated upright 90 degrees;Upright 30-60 min after meal                  Oral care BID   Frequent or constant Supervision/Assistance Dysphagia, unspecified (R13.10)     Continue with current plan of care;MBS     Kathryn Strickland  10/12/2024, 3:00 PM

## 2024-10-12 NOTE — Plan of Care (Signed)
" °  Problem: Education: Goal: Knowledge of General Education information will improve Description: Including pain rating scale, medication(s)/side effects and non-pharmacologic comfort measures Outcome: Progressing   Problem: Clinical Measurements: Goal: Cardiovascular complication will be avoided Outcome: Progressing   Problem: Activity: Goal: Risk for activity intolerance will decrease Outcome: Progressing   Problem: Coping: Goal: Level of anxiety will decrease Outcome: Progressing   Problem: Elimination: Goal: Will not experience complications related to bowel motility Outcome: Progressing   Problem: Pain Managment: Goal: General experience of comfort will improve and/or be controlled Outcome: Progressing   Problem: Safety: Goal: Ability to remain free from injury will improve Outcome: Progressing   Problem: Skin Integrity: Goal: Risk for impaired skin integrity will decrease Outcome: Progressing   "

## 2024-10-12 NOTE — Evaluation (Addendum)
 Physical Therapy Evaluation Patient Details Name: Kathryn Strickland MRN: [id number] DOB: 1933-01-17 Today's Date: 10/12/2024  History of Present Illness  89 year old female who apparently had a fall sometimes between the day prior to admission or the day of admission last known well at 1 PM today prior to admission.  Family noted right-sided weakness, facial droop difficulty with her gait. Evaluation in the ED consistent with a large subdural hematoma left convexity region with midline shift.  Neurosurgery consulted. Family debating nonoperative course. Past medical history significant for A-fib on chronic anticoagulation, cardiomyopathy, dementia, chronic kidney disease stage III, hyperlipidemia, essential hypertension, hypothyroidism, history of complete heart block.  Clinical Impression  Pt presents with admitting diagnosis above. Pt able to stand twice once with RW and once with 2 person HHA both requiring +2 Mod A. Pt noted with heavy posterior lean pressing BLE against recliner. Despite multiple cues to step forward and to march in place pt was unable. Pt also noted with R lateral lean sitting and standing. Pt son was present for eval and reports that pt was previously living alone and very independent with family checking on her daily. Pt reports that plan is to try to transition pt into LTC at SNF upon DC. Patient will benefit from continued inpatient follow up therapy, <3 hours/day. PT will continue to follow.         If plan is discharge home, recommend the following: Two people to help with walking and/or transfers;A lot of help with bathing/dressing/bathroom;Assistance with cooking/housework;Direct supervision/assist for medications management;Direct supervision/assist for financial management;Assist for transportation;Help with stairs or ramp for entrance;Supervision due to cognitive status   Can travel by private vehicle   No    Equipment Recommendations Wheelchair (measurements  PT);Wheelchair cushion (measurements PT);Hospital bed  Recommendations for Other Services       Functional Status Assessment Patient has had a recent decline in their functional status and demonstrates the ability to make significant improvements in function in a reasonable and predictable amount of time.     Precautions / Restrictions Precautions Precautions: Fall Recall of Precautions/Restrictions: Impaired Restrictions Weight Bearing Restrictions Per Provider Order: No      Mobility  Bed Mobility               General bed mobility comments: Up in recliner    Transfers Overall transfer level: Needs assistance Equipment used: Rolling walker (2 wheels), 2 person hand held assist Transfers: Sit to/from Stand Sit to Stand: +2 physical assistance, Mod assist, +2 safety/equipment           General transfer comment: Pt able to stand twice once with RW and once with 2 person HHA. Pt noted with heavy posterior lean pressing BLE against recliner. Despite multiple cues to step forward and to march in place pt was unable. Pt also noted with R lateral lean sitting and standing.    Ambulation/Gait               General Gait Details: Deferred for safety  Stairs            Wheelchair Mobility     Tilt Bed    Modified Rankin (Stroke Patients Only) Modified Rankin (Stroke Patients Only) Pre-Morbid Rankin Score: No symptoms Modified Rankin: Moderately severe disability     Balance Overall balance assessment: Needs assistance Sitting-balance support: Bilateral upper extremity supported, Single extremity supported, No upper extremity supported, Feet supported Sitting balance-Leahy Scale: Poor Sitting balance - Comments: R lateral lean Postural control: Right lateral lean  Standing balance support: Bilateral upper extremity supported, During functional activity Standing balance-Leahy Scale: Poor Standing balance comment: Posterior and R lateral lean.                              Pertinent Vitals/Pain Pain Assessment Pain Assessment: No/denies pain    Home Living Family/patient expects to be discharged to:: Private residence Living Arrangements: Alone Available Help at Discharge: Family;Available PRN/intermittently Type of Home: House Home Access: Level entry       Home Layout: One level Home Equipment: Agricultural Consultant (2 wheels);Cane - single point;Shower seat      Prior Function Prior Level of Function : Independent/Modified Independent;History of Falls (last six months) (Pt son reports multiple mechanical falls with no injuries.)             Mobility Comments: Ind no AD. Was living alone with family checking in daily ADLs Comments: Ind. Pt son reports that pt does sponge bathes at baseline.     Extremity/Trunk Assessment   Upper Extremity Assessment Upper Extremity Assessment: RUE deficits/detail RUE Deficits / Details: RUE grossly 4-/5    Lower Extremity Assessment Lower Extremity Assessment: RLE deficits/detail RLE Deficits / Details: RLE grossly 4-/5    Cervical / Trunk Assessment Cervical / Trunk Assessment: Kyphotic  Communication   Communication Communication: No apparent difficulties    Cognition Arousal: Alert Behavior During Therapy: Flat affect   PT - Cognitive impairments: History of cognitive impairments, Orientation, Awareness   Orientation impairments: Place, Situation                   PT - Cognition Comments: A&Ox2. Hx of dementia. Very flat affect and follow commands with increased time. Following commands: Impaired Following commands impaired: Follows one step commands inconsistently, Follows one step commands with increased time     Cueing Cueing Techniques: Verbal cues, Tactile cues     General Comments General comments (skin integrity, edema, etc.): VSS    Exercises     Assessment/Plan    PT Assessment Patient needs continued PT services  PT Problem List  Decreased strength;Decreased range of motion;Decreased activity tolerance;Decreased balance;Decreased mobility;Decreased coordination;Decreased cognition;Decreased knowledge of use of DME;Decreased safety awareness;Decreased knowledge of precautions;Cardiopulmonary status limiting activity       PT Treatment Interventions DME instruction;Gait training;Stair training;Functional mobility training;Therapeutic activities;Therapeutic exercise;Balance training;Neuromuscular re-education;Cognitive remediation;Patient/family education;Wheelchair mobility training    PT Goals (Current goals can be found in the Care Plan section)  Acute Rehab PT Goals Patient Stated Goal: to get better PT Goal Formulation: With patient/family Time For Goal Achievement: 10/26/24 Potential to Achieve Goals: Fair    Frequency Min 2X/week     Co-evaluation               AM-PAC PT 6 Clicks Mobility  Outcome Measure Help needed turning from your back to your side while in a flat bed without using bedrails?: A Lot Help needed moving from lying on your back to sitting on the side of a flat bed without using bedrails?: A Lot Help needed moving to and from a bed to a chair (including a wheelchair)?: A Lot Help needed standing up from a chair using your arms (e.g., wheelchair or bedside chair)?: A Lot Help needed to walk in hospital room?: Total Help needed climbing 3-5 steps with a railing? : Total 6 Click Score: 10    End of Session Equipment Utilized During Treatment: Gait belt Activity Tolerance: Patient tolerated  treatment well Patient left: in chair;with call bell/phone within reach;with chair alarm set Nurse Communication: Mobility status PT Visit Diagnosis: Other abnormalities of gait and mobility (R26.89)    Time: 1343-1410 PT Time Calculation (min) (ACUTE ONLY): 27 min   Charges:   PT Evaluation $PT Eval Moderate Complexity: 1 Mod PT Treatments $Therapeutic Activity: 8-22 mins PT General  Charges $$ ACUTE PT VISIT: 1 Visit         Sueellen NOVAK, PT, DPT Acute Rehab Services [phone]   Taya Ashbaugh 10/12/2024, 4:20 PM

## 2024-10-12 NOTE — Progress Notes (Signed)
 " PROGRESS NOTE    Kathryn Strickland  FMW:[id number] DOB: [birth year] DOA: 10/10/2024 PCP: Xenia Beryl BIRCH, MD   Brief Narrative: 89 year old with past medical history significant for A-fib on chronic anticoagulation, cardiomyopathy, dementia, chronic kidney disease stage III, hyperlipidemia, essential hypertension, hypothyroidism, history of complete heart block who lives alone at home and is able to take care of herself.  Patient apparently had a fall sometimes between the day prior to admission or the day of admission last known well at 1 PM today prior to admission.  Family noted right-sided weakness, facial droop difficulty with her gait.  EMS contacted.  Patient was awake but unable to communicate.  Evaluation in the ED consistent with a large subdural hematoma left convexity region with midline shift.  Neurosurgery consulted.  Family debating nonoperative course.   Assessment & Plan:   Principal Problem:   Subdural hematoma (HCC) Active Problems:   Hypertension   Complete heart block (HCC)   Cardiomyopathy (HCC)   CRI (chronic renal insufficiency), stage 3 (moderate)   Atrial fibrillation (HCC)   Hyperthyroidism   1-Large left-sided subdural hematoma: -No plan for surgical intervention, family leaning toward not proceeding with middle meningeal artery embolization. Neurosurgery following.  -Discussed with Son, patient wouldn't want to be resuscitated. DNR/DNI.  -Palliative care consulted.  -On clear liquid diet. Follow speech consult.  - Patient received Kcentra  on admission  A-fib: -Eliquis  has been discontinue.  -rate is controlled.   Dementia: -Per son patient has been having short term memory problems since 2020 -Delirium precaution.  -Resume meds: Namenda  and Aricept   Essential hypertension: -Holding diuretics, lisinopril.  If BP increases , can start norvasc.   S/ post fall -CK mildly elevated. Started fluids.   History Ischemic  cardiomyopathy: -compensated. Last EF 60 % 2020  Chronic kidney disease stage IIIb -monitor renal function.    Estimated body mass index is 20.8 kg/m as calculated from the following:   Height as of this encounter: 5' 5 (1.651 m).   Weight as of this encounter: 56.7 kg.   DVT prophylaxis: SCDs Code Status: DNR, discussed with son Family Communication: Care discussed with son Disposition Plan:  Status is: Inpatient Remains inpatient appropriate because: Management of subdural    Consultants:  Neurosurgery  Procedures:  None  Antimicrobials:    Subjective: She is alert, sitting recliner. Per son she was coughing a lot last night. No cough this am.   Objective: Vitals:   10/11/24 1637 10/11/24 1926 10/12/24 0001 10/12/24 0314  BP: (!) 141/57 (!) 155/61 139/73 138/60  Pulse: 61 72 73 71  Resp: 15 17 16 17   Temp: 98.1 F (36.7 C) 97.7 F (36.5 C) 98.3 F (36.8 C) 98.2 F (36.8 C)  TempSrc: Oral Oral Oral Oral  SpO2: 92% 99% 96% 95%  Weight:      Height:        Intake/Output Summary (Last 24 hours) at 10/12/2024 0740 Last data filed at 10/11/2024 1738 Gross per 24 hour  Intake 243.77 ml  Output 700 ml  Net -456.23 ml   Filed Weights   10/10/24 1822  Weight: 56.7 kg    Examination:  General exam: NAD Respiratory system: Normal resp effort, CTA Cardiovascular system: S 1, S 2 RRR Gastrointestinal system: BS present, soft, nt Central nervous system: Alert, right side weakness Extremities: no edema  Data Reviewed: I have personally reviewed following labs and imaging studies  CBC: Recent Labs  Lab 10/10/24 1806 10/12/24 0202  WBC 11.8* 7.7  NEUTROABS 10.4*  --   HGB 13.6 11.8*  HCT 42.7 35.6*  MCV 86.3 84.6  PLT 206 152   Basic Metabolic Panel: Recent Labs  Lab 10/10/24 1806 10/12/24 0202  NA 137 140  K 3.7 3.5  CL 101 108  CO2 23 23  GLUCOSE 131* 103*  BUN 24* 16  CREATININE 0.93 0.83  CALCIUM 10.2 8.9   GFR: Estimated  Creatinine Clearance: 41.1 mL/min (by C-G formula based on SCr of 0.83 mg/dL). Liver Function Tests: Recent Labs  Lab 10/10/24 1806  AST 70*  ALT 32  ALKPHOS 146*  BILITOT 0.9  PROT 6.9  ALBUMIN 3.8   No results for input(s): LIPASE, AMYLASE in the last 168 hours. No results for input(s): AMMONIA in the last 168 hours. Coagulation Profile: Recent Labs  Lab 10/10/24 1806  INR 1.1   Cardiac Enzymes: Recent Labs  Lab 10/10/24 1806  CKTOTAL 1,360*   BNP (last 3 results) No results for input(s): PROBNP in the last 8760 hours. HbA1C: No results for input(s): HGBA1C in the last 72 hours. CBG: No results for input(s): GLUCAP in the last 168 hours. Lipid Profile: No results for input(s): CHOL, HDL, LDLCALC, TRIG, CHOLHDL, LDLDIRECT in the last 72 hours. Thyroid  Function Tests: No results for input(s): TSH, T4TOTAL, FREET4, T3FREE, THYROIDAB in the last 72 hours. Anemia Panel: No results for input(s): VITAMINB12, FOLATE, FERRITIN, TIBC, IRON, RETICCTPCT in the last 72 hours. Sepsis Labs: No results for input(s): PROCALCITON, LATICACIDVEN in the last 168 hours.  No results found for this or any previous visit (from the past 240 hours).       Radiology Studies: CT ANGIO HEAD NECK W WO CM Result Date: 10/10/2024 EXAM: CTA Head and Neck with Intravenous Contrast. CT Head without Contrast. CLINICAL HISTORY: Neuro deficit, acute, stroke suspected. TECHNIQUE: Axial CTA images of the head and neck performed with and without intravenous contrast. MIP reconstructed images were created and reviewed. Axial computed tomography images of the head/brain performed without intravenous contrast. Note: Per PQRS, the description of internal carotid artery percent stenosis, including 0 percent or normal exam, is based on North American Symptomatic Carotid Endarterectomy Trial (NASCET) criteria. Dose reduction technique was used including one or more  of the following: automated exposure control, adjustment of mA and kV according to patient size, and/or iterative reconstruction. CONTRAST: With and without; 75 mL iohexol  (OMNIPAQUE ) 350 MG/ML injection. COMPARISON: CT Head and Neck dated 08/23/2023. FINDINGS: CT HEAD: BRAIN: There is a large left convexity subdural hematoma with a hyperdense component layering dependently. The hematoma measures 2.6 cm in thickness and causes rightward midline shift of 6 mm. There is marked mass effect on the left hemisphere. No acute intraparenchymal hemorrhage. No mass lesion. No CT evidence for acute territorial infarct. VENTRICLES: Narrowing of the left lateral ventricle. No hydrocephalus. ORBITS: The orbits are unremarkable. SINUSES AND MASTOIDS: The paranasal sinuses and mastoid air cells are clear. CTA NECK: COMMON CAROTID ARTERIES: Calcific aortic atherosclerosis. No significant stenosis. No dissection or occlusion. 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 Undulating contour of the distal internal carotid arteries is consistent with fibromuscular dysplasia. No dissection or occlusion. VERTEBRAL ARTERIES: No significant stenosis. No dissection or occlusion. CTA HEAD: ANTERIOR CEREBRAL ARTERIES: No significant stenosis. No occlusion. No aneurysm. MIDDLE CEREBRAL ARTERIES: No significant stenosis. No occlusion. No aneurysm. POSTERIOR CEREBRAL ARTERIES: No significant stenosis. No occlusion. No aneurysm. BASILAR ARTERY: No significant stenosis. No  occlusion. No aneurysm. OTHER: SOFT TISSUES: 11 mm calcified nodule in the left thyroid  lobe. 17 mm hypodense nodule in the right thyroid  lobe. BONES: No acute osseous abnormality. IMPRESSION: 1. Large left convexity subdural hematoma with a hyperdense component, measuring 2.6 cm in thickness, causing rightward midline shift of 6 mm and marked mass effect on the left hemisphere with narrowing of the  left lateral ventricle. 2. No emergent large vessel occlusion. 3. 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 4. Undulating contour of the distal internal carotid arteries, consistent with fibromuscular dysplasia. 5. 17 mm right thyroid  lobe nodule, and non-emergent thyroid  ultrasound is recommended. Findings communicated to Dr. Dasie at 9:37PM on 10/10/24. Electronically signed by: Franky Stanford MD MD 10/10/2024 09:39 PM EST RP Workstation: [id number]   CT C-SPINE NO CHARGE Result Date: 10/10/2024 EXAM: CT CERVICAL SPINE WITH CONTRAST 10/10/2024 09:12:10 PM TECHNIQUE: CT of the cervical spine was performed with the administration of 75 mL of iohexol  (OMNIPAQUE ) 350 MG/ML intravenous contrast. Multiplanar reformatted images are provided for review. Automated exposure control, iterative reconstruction, and/or weight based adjustment of the mA/kV was utilized to reduce the radiation dose to as low as reasonably achievable. COMPARISON: None available. CLINICAL HISTORY: FINDINGS: BONES AND ALIGNMENT: No acute fracture or traumatic malalignment. DEGENERATIVE CHANGES: Mid cervical degenerative disc disease at C4-C6. No spinal canal stenosis. SOFT TISSUES: No prevertebral soft tissue swelling. IMPRESSION: 1. Mid cervical degenerative disc disease at C4-C6 without spinal canal stenosis. Electronically signed by: Franky Stanford MD MD 10/10/2024 09:33 PM EST RP Workstation: [id number]        Scheduled Meds:  donepezil   10 mg Oral q morning   memantine   7 mg Oral Daily   methimazole   5 mg Oral Daily   Continuous Infusions:  sodium chloride  50 mL/hr at 10/11/24 1738     LOS: 2 days    Time spent: 35 Minutes    Kavya Haag A Tou Hayner, MD [facility]   If 7PM-7AM, please contact night-coverage [email or web address]  10/12/2024, 7:40 AM   "

## 2024-10-12 NOTE — TOC Initial Note (Addendum)
 Transition of Care [facility]) - Initial/Assessment Note    Patient Details  Name: Kathryn Strickland MRN: [id number] Date of Birth: 11-Apr-1933  Transition of Care [facility]) CM/SW Contact:    Andrez JULIANNA George, RN Phone Number: 10/12/2024, 11:33 AM  Clinical Narrative:                 Kathryn Strickland is a 89 y.o. female with medical history significant of atrial fibrillation on chronic anticoagulation, cardiomyopathy, dementia, chronic kidney disease stage III, hyperlipidemia, essential hypertension, hypothyroidism, history of complete heart block who lives alone at home and takes care of herself.   CM met with the patient and her son at the bedside. Son says she was doing well at home as long as they stuck to a routine. He would have medications out for daytime and night time and color coded and she would take them independently.  Son and daughter in law are able to rotate and provide assist at home.  CM has asked for PT/OT evals.  Palliative care to see for GOC.  IP Care management following.  1426: Family has decided on SNF with hospice. They are aware the room and board is private pay. Son asking for [facility]. CM has sent [facility] the referral. Awaiting review.   Expected Discharge Plan:  (TBD) Barriers to Discharge: Continued Medical Work up   Patient Goals and CMS Choice            Expected Discharge Plan and Services   Discharge Planning Services: CM Consult   Living arrangements for the past 2 months: Single Family Home                                      Prior Living Arrangements/Services Living arrangements for the past 2 months: Single Family Home Lives with:: Self Patient language and need for interpreter reviewed:: Yes          Care giver support system in place?: Yes (comment)   Criminal Activity/Legal Involvement Pertinent to Current Situation/Hospitalization: No - Comment as needed  Activities of Daily Living       Permission Sought/Granted                  Emotional Assessment Appearance:: Appears stated age   Affect (typically observed): Quiet Orientation: : Oriented to Self   Psych Involvement: No (comment)  Admission diagnosis:  Subdural hematoma (HCC) [S06.5XAA] Patient Active Problem List   Diagnosis Date Noted   Subdural hematoma (HCC) 10/10/2024   Osteoporosis 04/19/2022   Hyperthyroidism 12/11/2021   Primary osteoarthritis of hip 10/20/2019   Primary osteoarthritis of left hip 10/05/2019   Primary localized osteoarthritis of hip 06/09/2019   Primary osteoarthritis of right hip 05/18/2019   Cardiomyopathy (HCC) 04/20/2019   CRI (chronic renal insufficiency), stage 3 (moderate) 04/20/2019   Atrial fibrillation (HCC) 04/07/2019   Complete heart block (HCC) 03/24/2019   Hypertension    PCP:  Xenia Beryl BIRCH, MD Pharmacy:   [id number] - [place], [place] - [address] [place] [address] Phone: [phone] Fax: [phone]     Social Drivers of Health (SDOH) Social History: SDOH Screenings   Food Insecurity: No Food Insecurity (10/11/2024)  Housing: Unknown (10/11/2024)  Transportation Needs: No Transportation Needs (10/11/2024)  Utilities: Patient Unable To Answer (10/11/2024)  Depression (PHQ2-9): Low Risk (10/16/2021)  Financial Resource Strain: Low Risk (10/15/2021)  Physical  Activity: Insufficiently Active (10/15/2021)  Social Connections: Patient Unable To Answer (10/11/2024)  Stress: No Stress Concern Present (10/15/2021)  Tobacco Use: Low Risk (10/10/2024)   SDOH Interventions:     Readmission Risk Interventions     No data to display

## 2024-10-12 NOTE — [place] FL2 (Signed)
 " [place]  MEDICAID FL2 LEVEL OF CARE FORM     IDENTIFICATION  Patient Name: Pricella Gaugh Volante Birthdate: [birth year] Sex: female Admission Date (Current Location): 10/10/2024  [facility] and [place] Number:  Producer, Television/film/video and Address:  [facility] [place], [address] [place], [place] [address]      Provider Number: [id number]  Attending Physician Name and Address:  Madelyne Owen LABOR, MD  Relative Name and Phone Number:  Misk, Galentine [phone]  [phone]    Current Level of Care: SNF Recommended Level of Care: Skilled Nursing Facility Prior Approval Number:    Date Approved/Denied:   PASRR Number: [id number]  Discharge Plan: SNF    Current Diagnoses: Patient Active Problem List   Diagnosis Date Noted   Subdural hematoma (HCC) 10/10/2024   Osteoporosis 04/19/2022   Hyperthyroidism 12/11/2021   Primary osteoarthritis of hip 10/20/2019   Primary osteoarthritis of left hip 10/05/2019   Primary localized osteoarthritis of hip 06/09/2019   Primary osteoarthritis of right hip 05/18/2019   Cardiomyopathy (HCC) 04/20/2019   CRI (chronic renal insufficiency), stage 3 (moderate) 04/20/2019   Atrial fibrillation (HCC) 04/07/2019   Complete heart block (HCC) 03/24/2019   Hypertension     Orientation RESPIRATION BLADDER Height & Weight     Self  Normal Incontinent Weight: 56.7 kg Height:  5' 5 (165.1 cm)  BEHAVIORAL SYMPTOMS/MOOD NEUROLOGICAL BOWEL NUTRITION STATUS      Continent Diet (comfort feedings)  AMBULATORY STATUS COMMUNICATION OF NEEDS Skin   Extensive Assist Verbally Other (Comment) (bruising to arms and legs/ pink area to rt thigh/ bruise to Rt axilla and rt elbow)                       Personal Care Assistance Level of Assistance  Bathing, Feeding, Dressing Bathing Assistance: Maximum assistance Feeding assistance: Limited assistance Dressing Assistance: Maximum assistance     Functional Limitations Info   Sight, Hearing, Speech Sight Info: Impaired Hearing Info: Adequate Speech Info: Impaired (slurred)    SPECIAL CARE FACTORS FREQUENCY                       Contractures Contractures Info: Not present    Additional Factors Info  Code Status, Allergies, Psychotropic Code Status Info: DNR Allergies Info: NKA Psychotropic Info: Aricept  10 mg daily/ Namenda  XR 7mg  daily         Current Medications (10/12/2024):  This is the current hospital active medication list Current Facility-Administered Medications  Medication Dose Route Frequency Provider Last Rate Last Admin   0.9 %  sodium chloride  infusion   Intravenous Continuous Regalado, Belkys A, MD 50 mL/hr at 10/12/24 1041 New Bag at 10/12/24 1041   donepezil  (ARICEPT ) tablet 10 mg  10 mg Oral q morning Regalado, Belkys A, MD   10 mg at 10/12/24 0943   labetalol  (NORMODYNE ) injection 5 mg  5 mg Intravenous Q2H PRN Guillermina Hamilton, MD       memantine  (NAMENDA  XR) 24 hr capsule 7 mg  7 mg Oral Daily Regalado, Belkys A, MD   7 mg at 10/12/24 0943   methimazole  (TAPAZOLE ) tablet 5 mg  5 mg Oral Daily Regalado, Belkys A, MD   5 mg at 10/12/24 0943   morphine  (PF) 2 MG/ML injection 2 mg  2 mg Intravenous Q2H PRN Sim Emery CROME, MD       ondansetron  (ZOFRAN ) tablet 4 mg  4 mg Oral Q6H PRN Sim Emery  L, MD       Or   ondansetron  (ZOFRAN ) injection 4 mg  4 mg Intravenous Q6H PRN Garba, Mohammad L, MD         Discharge Medications: Please see discharge summary for a list of discharge medications.  Relevant Imaging Results:  Relevant Lab Results:   Additional Information SS#: [id number]  Andrez JULIANNA George, RN     "

## 2024-10-13 ENCOUNTER — Inpatient Hospital Stay (HOSPITAL_COMMUNITY)

## 2024-10-13 DIAGNOSIS — S065XAA Traumatic subdural hemorrhage with loss of consciousness status unknown, initial encounter: Secondary | ICD-10-CM | POA: Diagnosis not present

## 2024-10-13 MED ORDER — PANTOPRAZOLE SODIUM 40 MG PO TBEC
40.0000 mg | DELAYED_RELEASE_TABLET | Freq: Two times a day (BID) | ORAL | Status: DC
  Administered 2024-10-13: 40 mg via ORAL
  Filled 2024-10-13: qty 1

## 2024-10-13 MED ORDER — GUAIFENESIN-DM 100-10 MG/5ML PO SYRP
5.0000 mL | ORAL_SOLUTION | ORAL | Status: DC | PRN
  Administered 2024-10-13: 5 mL via ORAL
  Filled 2024-10-13: qty 5

## 2024-10-13 MED ORDER — GUAIFENESIN-DM 100-10 MG/5ML PO SYRP: 5.0000 mL | ORAL_SOLUTION | ORAL | 0 refills | Status: AC | PRN

## 2024-10-13 MED ORDER — IPRATROPIUM-ALBUTEROL 0.5-2.5 (3) MG/3ML IN SOLN: 3.0000 mL | Freq: Four times a day (QID) | RESPIRATORY_TRACT | Status: DC | PRN

## 2024-10-13 MED ORDER — MELATONIN 3 MG PO TABS: 3.0000 mg | ORAL_TABLET | Freq: Every evening | ORAL | 0 refills | Status: AC | PRN

## 2024-10-13 MED ORDER — IPRATROPIUM-ALBUTEROL 0.5-2.5 (3) MG/3ML IN SOLN: 3.0000 mL | Freq: Four times a day (QID) | RESPIRATORY_TRACT | 0 refills | Status: AC | PRN

## 2024-10-13 MED ORDER — MELATONIN 3 MG PO TABS: 3.0000 mg | ORAL_TABLET | Freq: Every evening | ORAL | Status: DC | PRN

## 2024-10-13 MED ORDER — ONDANSETRON HCL 4 MG PO TABS: 4.0000 mg | ORAL_TABLET | Freq: Four times a day (QID) | ORAL | 0 refills | Status: AC | PRN

## 2024-10-13 MED ORDER — PANTOPRAZOLE SODIUM 40 MG PO TBEC: 40.0000 mg | DELAYED_RELEASE_TABLET | Freq: Two times a day (BID) | ORAL | 0 refills | Status: AC

## 2024-10-13 MED ORDER — BENZONATATE 100 MG PO CAPS: 100.0000 mg | ORAL_CAPSULE | Freq: Two times a day (BID) | ORAL | 0 refills | Status: AC

## 2024-10-13 MED ORDER — BENZONATATE 100 MG PO CAPS
100.0000 mg | ORAL_CAPSULE | Freq: Two times a day (BID) | ORAL | Status: DC
  Administered 2024-10-13: 100 mg via ORAL
  Filled 2024-10-13: qty 1

## 2024-10-13 MED ORDER — PANTOPRAZOLE SODIUM 40 MG PO TBEC: 40.0000 mg | DELAYED_RELEASE_TABLET | Freq: Every day | ORAL | Status: DC

## 2024-10-13 NOTE — Evaluation (Signed)
 Occupational Therapy Evaluation Patient Details Name: Kathryn Strickland MRN: [id number] DOB: 26-Aug-1933 Today's Date: 10/13/2024   History of Present Illness   89 y.o. female presenting 1/10 after being found down at home with R weakness, facial droop, gait difficulty. LKN 1pm 1/9. Found to have large SDH left convexity region with midline shift.  Neurosurgery consulted. Family debating nonoperative course. PMH: A-fib on chronic anticoagulation, cardiomyopathy, dementia, chronic kidney disease stage III, hyperlipidemia, essential hypertension, hypothyroidism, history of complete heart block.     Clinical Impressions PTA, pt living alone and was independent with ADL and family assisted daily with IADL. Upon eval, pt presents with decreased initiation, safety, endurance, R sided strength, general weakness, processing, and cognition. Pt currently requiring mod-total A for BADL and mod A +2 safety for basic transfers. Patient will benefit from continued inpatient follow up therapy, <3 hours/day      If plan is discharge home, recommend the following:   Two people to help with walking and/or transfers;Assistance with cooking/housework;A lot of help with bathing/dressing/bathroom;Direct supervision/assist for financial management;Assist for transportation;Help with stairs or ramp for entrance;Direct supervision/assist for medications management     Functional Status Assessment   Patient has had a recent decline in their functional status and demonstrates the ability to make significant improvements in function in a reasonable and predictable amount of time.     Equipment Recommendations   None recommended by OT;Other (comment) (defer)     Recommendations for Other Services         Precautions/Restrictions   Precautions Precautions: Fall Recall of Precautions/Restrictions: Impaired Restrictions Weight Bearing Restrictions Per Provider Order: No     Mobility Bed  Mobility Overal bed mobility: Needs Assistance Bed Mobility: Supine to Sit, Sit to Supine     Supine to sit: Max assist Sit to supine: Max assist   General bed mobility comments: dense steo by step multimodal (verbal/tactile) cues for sequncing BLE. mod A to raise trunk    Transfers Overall transfer level: Needs assistance Equipment used: 1 person hand held assist, 2 person hand held assist Transfers: Sit to/from Stand, Bed to chair/wheelchair/BSC Sit to Stand: +2 physical assistance, Mod assist, +2 safety/equipment Stand pivot transfers: Mod assist, +2 safety/equipment, +2 physical assistance         General transfer comment: mod A for rise and steady; son present and closeby for safety providing 1 UE for pt to hold with her LUE. pt unable to sustain holding therapist arm with RUE. facilitation of weight shift and R knee block with OT facilitation of LLE moving toward her L to pivot toward L; asssist for RLE progression as well      Balance Overall balance assessment: Needs assistance Sitting-balance support: Bilateral upper extremity supported, Single extremity supported, No upper extremity supported, Feet supported Sitting balance-Leahy Scale: Poor   Postural control: Posterior lean Standing balance support: Bilateral upper extremity supported, During functional activity Standing balance-Leahy Scale: Poor                             ADL either performed or assessed with clinical judgement   ADL Overall ADL's : Needs assistance/impaired     Grooming: Moderate assistance;Sitting;Wash/dry face Grooming Details (indicate cue type and reason): cues to initiate and sustain             Lower Body Dressing: Total assistance;Sitting/lateral leans   Toilet Transfer: Moderate assistance;+2 for safety/equipment;Stand-pivot  Functional mobility during ADLs: Moderate assistance;+2 for physical assistance;+2 for safety/equipment       Vision  Baseline Vision/History: 1 Wears glasses Patient Visual Report: No change from baseline Additional Comments: OT to continue to assess. Pt makes good eye contact  bilaterally but does seem to have a mild R gaze preference at rest     Perception         Praxis Praxis: Impaired Praxis Impairment Details: Initiation     Pertinent Vitals/Pain Pain Assessment Pain Assessment: No/denies pain     Extremity/Trunk Assessment Upper Extremity Assessment Upper Extremity Assessment: RUE deficits/detail;LUE deficits/detail RUE Deficits / Details: able to reach face with increased time. decreased initiation on R noted and 2+/5 grasp. other MMT not formally assessed due to pt cognition LUE Deficits / Details: generally weak   Lower Extremity Assessment Lower Extremity Assessment: Defer to PT evaluation   Cervical / Trunk Assessment Cervical / Trunk Assessment: Kyphotic   Communication Communication Communication: Impaired Factors Affecting Communication:  (slowed pace of response unsure baseline)   Cognition Arousal: Alert Behavior During Therapy: Flat affect Cognition: History of cognitive impairments, Cognition impaired   Orientation impairments: Time, Situation, Place Awareness: Intellectual awareness impaired Memory impairment (select all impairments): Short-term memory, Declarative long-term memory, Working memory Attention impairment (select first level of impairment): Sustained attention Executive functioning impairment (select all impairments): Organization, Initiation, Sequencing, Problem solving OT - Cognition Comments: pt with dementia at baseline, with son reporting that she does well with direct commands rather than being asked if she wants to do something. on eval, needing cues for sequencing basic mobility and ADL tasks as well as multimodal cues for initiation. oriented to self                 Following commands: Impaired Following commands impaired: Follows one step  commands inconsistently, Follows one step commands with increased time     Cueing  General Comments   Cueing Techniques: Verbal cues;Gestural cues;Tactile cues  VSS   Exercises     Shoulder Instructions      Home Living Family/patient expects to be discharged to:: Private residence Living Arrangements: Alone Available Help at Discharge: Family;Available PRN/intermittently Type of Home: House Home Access: Level entry     Home Layout: One level     Bathroom Shower/Tub: Tub/shower unit;Sponge bathes at baseline   Bathroom Toilet: Handicapped height     Home Equipment: Agricultural Consultant (2 wheels);Cane - single point;Shower seat          Prior Functioning/Environment Prior Level of Function : Independent/Modified Independent;History of Falls (last six months) (pt son reports multiple mechanical falls with no injury)             Mobility Comments: Ind no AD. Was living alone with family checking in daily ADLs Comments: Ind ADL. Pt son reports that pt does sponge bathes at baseline.    OT Problem List: Decreased cognition;Decreased strength;Decreased activity tolerance;Impaired balance (sitting and/or standing);Decreased coordination;Decreased safety awareness;Decreased knowledge of use of DME or AE;Impaired UE functional use   OT Treatment/Interventions: Self-care/ADL training;Therapeutic exercise;DME and/or AE instruction;Therapeutic activities;Patient/family education;Balance training;Cognitive remediation/compensation;Neuromuscular education      OT Goals(Current goals can be found in the care plan section)   Acute Rehab OT Goals Patient Stated Goal: get better OT Goal Formulation: With patient Time For Goal Achievement: 10/27/24 Potential to Achieve Goals: Good   OT Frequency:  Min 2X/week    Co-evaluation  AM-PAC OT 6 Clicks Daily Activity     Outcome Measure Help from another person eating meals?: A Lot Help from another person  taking care of personal grooming?: A Lot Help from another person toileting, which includes using toliet, bedpan, or urinal?: Total Help from another person bathing (including washing, rinsing, drying)?: A Lot Help from another person to put on and taking off regular upper body clothing?: A Lot Help from another person to put on and taking off regular lower body clothing?: Total 6 Click Score: 10   End of Session Equipment Utilized During Treatment: Gait belt Nurse Communication: Mobility status (left in bed secondary to swallow study planned for 1000)  Activity Tolerance: Patient tolerated treatment well Patient left: in bed;with call bell/phone within reach;with bed alarm set  OT Visit Diagnosis: Unsteadiness on feet (R26.81);Muscle weakness (generalized) (M62.81);Other symptoms and signs involving cognitive function;History of falling (Z91.81);Hemiplegia and hemiparesis Hemiplegia - Right/Left: Right Hemiplegia - caused by:  (SDH)                Time: 0900-0930 OT Time Calculation (min): 30 min Charges:  OT General Charges $OT Visit: 1 Visit OT Evaluation $OT Eval Moderate Complexity: 1 Mod OT Treatments $Therapeutic Activity: 8-22 mins  Elma JONETTA Lebron FREDERICK, OTR/L [facility] Acute Rehabilitation Office: [phone]   Elma JONETTA Lebron 10/13/2024, 11:20 AM

## 2024-10-13 NOTE — Discharge Summary (Addendum)
 " Physician Discharge Summary   Patient: Kathryn Strickland MRN: [id number] DOB: 08/10/1933  Admit date:     10/10/2024  Discharge date: 10/13/2024  Discharge Physician: Owen DELENA Lore   PCP: Xenia Beryl BIRCH, MD   Recommendations at discharge:    Discharge to SNF for Hospice care.  Needs hospice follow up for symptoms management.   Discharge Diagnoses: Principal Problem:   Subdural hematoma (HCC) Active Problems:   Hypertension   Complete heart block (HCC)   Cardiomyopathy (HCC)   CRI (chronic renal insufficiency), stage 3 (moderate)   Atrial fibrillation (HCC)   Hyperthyroidism  Resolved Problems:   * No resolved hospital problems. *  Hospital Course: 89 year old with past medical history significant for A-fib on chronic anticoagulation, cardiomyopathy, dementia, chronic kidney disease stage III, hyperlipidemia, essential hypertension, hypothyroidism, history of complete heart block who lives alone at home and is able to take care of herself.  Patient apparently had a fall sometimes between the day prior to admission or the day of admission last known well at 1 PM today prior to admission.  Family noted right-sided weakness, facial droop difficulty with her gait.  EMS contacted.  Patient was awake but unable to communicate.  Evaluation in the ED consistent with a large subdural hematoma left convexity region with midline shift.  Neurosurgery consulted.  Family debating nonoperative course.   Assessment and Plan: 1-Large left-sided subdural hematoma: -No plan for surgical intervention, family leaning toward not proceeding with middle meningeal artery embolization. Neurosurgery following.  -Discussed with Son, patient wouldn't want to be resuscitated. DNR/DNI.  -Palliative care consulted.  -On clear liquid diet. Follow speech consult. Family agreed with MBS for evaluation, plan to proceed prior to discharge - Patient received Kcentra  on admission  dysphagia 1 diet puree thin  liquids. Could advanced to Dysphagia 2 thin liquids if tolerates purees.  A-fib: -Eliquis  has been discontinue.  -Rate is controlled.    Dementia: -Per son patient has been having short term memory problems since 2020 -Delirium precaution.  -Resume meds: Namenda  and Aricept    Essential hypertension: -Holding diuretics, lisinopril.  If BP increases , can start norvasc.    S/ post fall -CK mildly elevated. Started fluids.    History Ischemic cardiomyopathy: -compensated. Last EF 60 % 2020   Chronic kidney disease stage IIIb -monitor renal function.     Respiratory System;  Patient has cough, specially at night. Continue with cough medicine as need, will start PPI Concern for aspiration.  Symptoms management.  PRN nebulizer.   Estimated body mass index is 20.8 kg/m as calculated from the following:   Height as of this encounter: 5' 5 (1.651 m).   Weight as of this encounter: 56.7 kg.       Consultants: Neurosurgery. Palliative care Procedures performed: none Disposition: Long term care facility Diet recommendation:  Full liquid diet DISCHARGE MEDICATION: Allergies as of 10/13/2024   No Known Allergies      Medication List     STOP taking these medications    alendronate 70 MG tablet Commonly known as: FOSAMAX   apixaban  2.5 MG Tabs tablet Commonly known as: Eliquis    enalapril  10 MG tablet Commonly known as: VASOTEC    hydrochlorothiazide  12.5 MG tablet Commonly known as: HYDRODIURIL        TAKE these medications    benzonatate  100 MG capsule Commonly known as: TESSALON  Take 1 capsule (100 mg total) by mouth 2 (two) times daily.   donepezil  10 MG tablet Commonly known as: ARICEPT  Take  10 mg by mouth every morning.   guaiFENesin -dextromethorphan  100-10 MG/5ML syrup Commonly known as: ROBITUSSIN DM Take 5 mLs by mouth every 4 (four) hours as needed for cough.   ipratropium-albuterol  0.5-2.5 (3) MG/3ML Soln Commonly known as: DUONEB Take 3  mLs by nebulization every 6 (six) hours as needed.   melatonin 3 MG Tabs tablet Take 1 tablet (3 mg total) by mouth at bedtime as needed.   memantine  7 MG Cp24 24 hr capsule Commonly known as: NAMENDA  XR Take 7 mg by mouth daily.   methimazole  5 MG tablet Commonly known as: TAPAZOLE  Take 5 mg by mouth daily.   ondansetron  4 MG tablet Commonly known as: ZOFRAN  Take 1 tablet (4 mg total) by mouth every 6 (six) hours as needed for nausea.   pantoprazole  40 MG tablet Commonly known as: PROTONIX  Take 1 tablet (40 mg total) by mouth 2 (two) times daily.   VITAMIN B COMPLEX PO Take 1 tablet by mouth daily.   VITAMIN D  PO Take 5,000 Units by mouth daily.         Contact information for after-discharge care     Destination     [facility], [place] .   Service: Skilled Nursing Contact information: [address] [place]  [phone]                    Discharge Exam: Kathryn Strickland   10/10/24 1822  Weight: 56.7 kg   General; NAD, Right side weakness  Condition at discharge: stable  The results of significant diagnostics from this hospitalization (including imaging, microbiology, ancillary and laboratory) are listed below for reference.   Imaging Studies: CT ANGIO HEAD NECK W WO CM Result Date: 10/10/2024 EXAM: CTA Head and Neck with Intravenous Contrast. CT Head without Contrast. CLINICAL HISTORY: Neuro deficit, acute, stroke suspected. TECHNIQUE: Axial CTA images of the head and neck performed with and without intravenous contrast. MIP reconstructed images were created and reviewed. Axial computed tomography images of the head/brain performed without intravenous contrast. Note: Per PQRS, the description of internal carotid artery percent stenosis, including 0 percent or normal exam, is based on North American Symptomatic Carotid Endarterectomy Trial (NASCET) criteria. Dose reduction technique was used including one or  more of the following: automated exposure control, adjustment of mA and kV according to patient size, and/or iterative reconstruction. CONTRAST: With and without; 75 mL iohexol  (OMNIPAQUE ) 350 MG/ML injection. COMPARISON: CT Head and Neck dated 08/23/2023. FINDINGS: CT HEAD: BRAIN: There is a large left convexity subdural hematoma with a hyperdense component layering dependently. The hematoma measures 2.6 cm in thickness and causes rightward midline shift of 6 mm. There is marked mass effect on the left hemisphere. No acute intraparenchymal hemorrhage. No mass lesion. No CT evidence for acute territorial infarct. VENTRICLES: Narrowing of the left lateral ventricle. No hydrocephalus. ORBITS: The orbits are unremarkable. SINUSES AND MASTOIDS: The paranasal sinuses and mastoid air cells are clear. CTA NECK: COMMON CAROTID ARTERIES: Calcific aortic atherosclerosis. No significant stenosis. No dissection or occlusion. 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 Undulating contour of the distal internal carotid arteries is consistent with fibromuscular dysplasia. No dissection or occlusion. VERTEBRAL ARTERIES: No significant stenosis. No dissection or occlusion. CTA HEAD: ANTERIOR CEREBRAL ARTERIES: No significant stenosis. No occlusion. No aneurysm. MIDDLE CEREBRAL ARTERIES: No significant stenosis. No occlusion. No aneurysm. POSTERIOR CEREBRAL ARTERIES: No significant stenosis. No occlusion. No aneurysm.  BASILAR ARTERY: No significant stenosis. No occlusion. No aneurysm. OTHER: SOFT TISSUES: 11 mm calcified nodule in the left thyroid  lobe. 17 mm hypodense nodule in the right thyroid  lobe. BONES: No acute osseous abnormality. IMPRESSION: 1. Large left convexity subdural hematoma with a hyperdense component, measuring 2.6 cm in thickness, causing rightward midline shift of 6 mm and marked mass effect on the left hemisphere with narrowing of  the left lateral ventricle. 2. No emergent large vessel occlusion. 3. 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 4. Undulating contour of the distal internal carotid arteries, consistent with fibromuscular dysplasia. 5. 17 mm right thyroid  lobe nodule, and non-emergent thyroid  ultrasound is recommended. Findings communicated to Dr. Dasie at 9:37PM on 10/10/24. Electronically signed by: Franky Stanford MD MD 10/10/2024 09:39 PM EST RP Workstation: [id number]   CT C-SPINE NO CHARGE Result Date: 10/10/2024 EXAM: CT CERVICAL SPINE WITH CONTRAST 10/10/2024 09:12:10 PM TECHNIQUE: CT of the cervical spine was performed with the administration of 75 mL of iohexol  (OMNIPAQUE ) 350 MG/ML intravenous contrast. Multiplanar reformatted images are provided for review. Automated exposure control, iterative reconstruction, and/or weight based adjustment of the mA/kV was utilized to reduce the radiation dose to as low as reasonably achievable. COMPARISON: None available. CLINICAL HISTORY: FINDINGS: BONES AND ALIGNMENT: No acute fracture or traumatic malalignment. DEGENERATIVE CHANGES: Mid cervical degenerative disc disease at C4-C6. No spinal canal stenosis. SOFT TISSUES: No prevertebral soft tissue swelling. IMPRESSION: 1. Mid cervical degenerative disc disease at C4-C6 without spinal canal stenosis. Electronically signed by: Franky Stanford MD MD 10/10/2024 09:33 PM EST RP Workstation: [id number]   CUP PACEART REMOTE DEVICE CHECK Result Date: 09/18/2024 PPM Scheduled remote reviewed. Normal device function.  Presenting rhythm: AS/VP 17 AF events, longest duration 6hrs , controlled rates, burden 1.2%, Eliquis  per PA report Next remote transmission per protocol. LA, CVRS  CUP PACEART INCLINIC DEVICE CHECK Result Date: 09/17/2024 Normal in-clinic dual chamber pacemaker check. Presenting Rhythm: ASVP. Routine testing of thresholds, sensing, and impedance demonstrate  stable parameters and no programming changes needed at this time. No episodes. Estimated longevity 4.4 years . Pt enrolled in remote follow-up. bo   Microbiology: Results for orders placed or performed during the hospital encounter of 10/16/19  Novel Coronavirus, NAA (Hosp order, Send-out to Ref Lab; TAT 18-24 hrs     Status: None   Collection Time: 10/16/19 12:44 PM   Specimen: Nasopharyngeal Swab; Respiratory  Result Value Ref Range Status   SARS-CoV-2, NAA NOT DETECTED NOT DETECTED Final    Comment: (NOTE) This nucleic acid amplification test was developed and its performance characteristics determined by [employer]. Nucleic acid amplification tests include PCR and TMA. This test has not been FDA cleared or approved. This test has been authorized by FDA under an Emergency Use Authorization (EUA). This test is only authorized for the duration of time the declaration that circumstances exist justifying the authorization of the emergency use of in vitro diagnostic tests for detection of SARS-CoV-2 virus and/or diagnosis of COVID-19 infection under section 564(b)(1) of the Act, 21 U.S.C. 639aaa-6(a) (1), unless the authorization is terminated or revoked sooner. When diagnostic testing is negative, the possibility of a false negative result should be considered in the context of a patient's recent exposures and the presence of clinical signs and symptoms consistent with COVID-19. An individual without symptoms of COVID- 19 and who is not shedding SARS-CoV-2 vi rus would expect to have a negative (not detected) result in this  assay. Performed At: [facility] [address] [place], [place] [id number] Jennette Shorter MD Ey:[id number]    Coronavirus Source NASOPHARYNGEAL  Final    Comment: Performed at [facility], [address], [place], [place] [address]    Labs: CBC: Recent Labs  Lab 10/10/24 1806 10/12/24 0202  WBC 11.8* 7.7  NEUTROABS 10.4*  --    HGB 13.6 11.8*  HCT 42.7 35.6*  MCV 86.3 84.6  PLT 206 152   Basic Metabolic Panel: Recent Labs  Lab 10/10/24 1806 10/12/24 0202  NA 137 140  K 3.7 3.5  CL 101 108  CO2 23 23  GLUCOSE 131* 103*  BUN 24* 16  CREATININE 0.93 0.83  CALCIUM 10.2 8.9   Liver Function Tests: Recent Labs  Lab 10/10/24 1806  AST 70*  ALT 32  ALKPHOS 146*  BILITOT 0.9  PROT 6.9  ALBUMIN 3.8   CBG: No results for input(s): GLUCAP in the last 168 hours.  Discharge time spent: greater than 30 minutes.  Signed: Owen DELENA Lore, MD [facility] 10/13/2024 "

## 2024-10-13 NOTE — Care Management Important Message (Signed)
 Important Message  Patient Details  Name: Kathryn Strickland MRN: [id number] Date of Birth: 1932-10-31   Important Message Given:  Yes - Medicare IM     Claretta Deed 10/13/2024, 12:12 PM

## 2024-10-13 NOTE — Progress Notes (Signed)
 [facility] 2W20 [facility] Liaison Note  Received request from [facility] Kelli for hospice services at [facility] after discharge. Spoke with patient's son, Redell to initiate education related to hospice philosophy, services and team approach to care. Son verbalized understanding of information given. Per discussion, the plan is for discharge today.  DME needs discussed. Patient has the following equipment: walker, cane. Family requests the following equipment for delivery: overbed table.   Please send signed and completed DNR home with patient/family. Please provide prescriptions at discharge as needed to ensure ongoing symptom management.  [facility] information and contact numbers given to [place]. Please call with any concerns.  Thank you for the opportunity to participate in this patient's care.   Eleanor Nail, LPN [facility] Liaison [phone]

## 2024-10-13 NOTE — Consult Note (Signed)
 Palliative Care Consult Note  Reason for Consult:Hospice Options  Patient is an 89 year old woman who suffered a mechanical fall at home and was down for an unknown period of time until found by on the floor at home mostly unresponsive by her family. She had known mild-moderate dementia but was able to live alone with close family check-ins and a routine.In the ED she was found to have a large sub-dural hematoma. Given severe neurological deficits, advanced age, dementia and anticoagulation for complete heart block a decision was made to manage non-operatively. Palliative care was consulted to assist with establishing goals of care and to explore hospice options with patients family.  I met with patients son Kathryn Strickland, who is her primary caregiver amd HCPOA at bedside. Kathryn Strickland shared his mothers challenges and decline over the past few months. They knew she would need more care eventually, but this was unexpected. They are processing her condition and prognosis that have been shared by the medical team. They understand her illness is life-limiting and that she will not return to her previous functional status.They desire comfort and dignity for her at the end of her life and not prolonging her life if she is suffering.  We discussed the uncertainty of prognostication and I tried to prepare her son for possible illness trajectory.   Patient denies any pain or needs when asked directly. She is pleasant and communicative but confused and cognitively impaired-she can recall no recent events including her fall or reason for being in the hospital.  Exam: Patient is OOB in recliner, full assist lift into chair. Dense right sided hemiplegia, severe short term memory loss, she is awake can follow very simple commands and can speak in simple language, she is visibly drowsy and has difficulty sustaining her wakefulness and attention during my visit. She is not agitated or distressed. Lungs are clear, she is having some  difficulty managing her oral secretions.  Assessment and Recommendations:  Large Subdural Hematoma: Non-operative, resulting in irreversible brain damage impacting cognition, mobility and is ultimately terminal. She is high risk for complications including infection or PNA. End-of Life Care Planning: They understand the poor prognosis and are interested in hospice options for her care. Presently she is taking in minimal oral nutrition, drinking fluids with full assist and when offered. She fortunately does not have any pain or difficult symptoms to manage, although this may change as her condition declines.Based on stated goals I am recommending long term care placement with hospice servcies at facility.  Pre-existing DNR order noted.  Patients son would like for her to go to [facility] for LTC and also hospice services. I encouraged him to contact facility of choice for LTC private pay. I will also notify TOC of this choice so they can assist in discharge planning process.  WIll follow as needed.  Plan discussed with hospitalist attending and other members of the care team.  Almarie General, DO Palliative Medicine   I personally spent a total of 80 minutes in the care of the patient today including preparing to see the patient, getting/reviewing separately obtained history, performing a medically appropriate exam/evaluation, counseling and educating, referring and communicating with other health care professionals, documenting clinical information in the EHR, and coordinating care.   Almarie General, DO Palliative Medicine

## 2024-10-13 NOTE — TOC Transition Note (Signed)
 Transition of Care [facility]) - Discharge Note   Patient Details  Name: Kathryn Strickland MRN: [id number] Date of Birth: 08-11-1933  [facility] of [facility]) CM/SW Contact:  Andrez JULIANNA George, RN Phone Number: 10/13/2024, 11:10 AM   Clinical Narrative:     Pt is discharging to [facility] with hospice services through [facility]. [facility] is aware of dc today. Pt will transport via [facility].   Room: 309 Number for report: [phone]  Final next level of care: Skilled Nursing Facility Barriers to Discharge: No Barriers Identified   Patient Goals and CMS Choice   CMS Medicare.gov Compare Post Acute Care list provided to:: Patient Represenative (must comment) Choice offered to / list presented to : Adult Children      Discharge Placement PASRR number recieved: 10/12/24            Patient chooses bed at: [facility] [place] Patient to be transferred to facility by: [facility] Name of family member notified: son Patient and family notified of of transfer: 10/13/24  Discharge Plan and Services Additional resources added to the After Visit Summary for     Discharge Planning Services: CM Consult                                 Social Drivers of Health (SDOH) Interventions SDOH Screenings   Food Insecurity: No Food Insecurity (10/11/2024)  Housing: Unknown (10/11/2024)  Transportation Needs: No Transportation Needs (10/11/2024)  Utilities: Patient Unable To Answer (10/11/2024)  Depression (PHQ2-9): Low Risk (10/16/2021)  Financial Resource Strain: Low Risk (10/15/2021)  Physical Activity: Insufficiently Active (10/15/2021)  Social Connections: Patient Unable To Answer (10/11/2024)  Stress: No Stress Concern Present (10/15/2021)  Tobacco Use: Low Risk (10/10/2024)     Readmission Risk Interventions     No data to display

## 2024-10-13 NOTE — Progress Notes (Signed)
 Report given to Ashlynn.

## 2024-10-13 NOTE — Procedures (Signed)
 Modified Barium Swallow Study  Patient Details  Name: Kathryn Strickland MRN: [id number] Date of Birth: Aug 28, 1933  Today's Date: 10/13/2024  Modified Barium Swallow completed.  Full report located under Chart Review in the Imaging Section.  History of Present Illness Kathryn Strickland is an 89 y.o. female who presented to the hospital from home on 10/10/24  (lives alone and independent with ADL's)  after being found down at home by family. Family also noted right sided facial droop and difficulty with her gait. Workup in ED showed a large SDH on the left convexity region with midline shift. Neurosurgery consulted and following but family considering more of a comfort/palliative plan of care. She has been NPO and SLP swallow evaluation ordered. Son indicated that on 1/11 at 1115, she exhibited a significant improvement in her alertness, cogntion, communication but not back to baseline. PMH: dementia, HTN, a-fib, osteoporosis.   Clinical Impression Shiesha Jahn presents with a mild oropharyngeal dysphagia as per this MBS. Partial anterior hyoid excursion, partial epiglottic inversion and incomplete laryngeal vestibule closure led to penetration above the vocal cords that cleared (PAS 2) with thin and nectar thick liquids as well as trace amount of penetration above the vocal cords that did not clear (PAS 3) occued with thin liquids. No aspiration seen on this study. Pharyngeal residuals remained with liquids and solids, with secondary swallow helping to clear some but not all of residuals. Recommendation is to advance diet to Dys 1 (Puree) solids, thin liquids with goal of advancing to dys 2 (minced) as tolerated. Factors that may increase risk of adverse event in presence of aspiration Noe & Lianne 2021): Reduced cognitive function;Weak cough  DIGEST Swallow Severity Rating*  Safety: 1  Efficiency:1  Overall Pharyngeal Swallow Severity: 1 1: mild; 2: moderate; 3: severe; 4: profound   *The Dynamic Imaging Grade of Swallowing Toxicity is standardized for the head and neck cancer population, however, demonstrates promising clinical applications across populations to standardize the clinical rating of pharyngeal swallow safety and severity.  Swallow Evaluation Recommendations Recommendations: PO diet PO Diet Recommendation: Thin liquids (Level 0);Dysphagia 1 (Pureed) Liquid Administration via: Cup;Straw Medication Administration: Crushed with puree Supervision: Patient able to self-feed;Full supervision/cueing for swallowing strategies Swallowing strategies  : Slow rate;Small bites/sips;Follow solids with liquids;Multiple dry swallows after each bite/sip Postural changes: Position pt fully upright for meals;Stay upright 30-60 min after meals Oral care recommendations: Oral care BID (2x/day)      [name], MA, CCC-SLP Speech Therapy  10/13/2024,12:20 PM

## 2024-10-13 NOTE — Plan of Care (Signed)
" °  Problem: Health Behavior/Discharge Planning: Goal: Ability to manage health-related needs will improve Outcome: Progressing   Problem: Coping: Goal: Level of anxiety will decrease Outcome: Progressing   Problem: Pain Managment: Goal: General experience of comfort will improve and/or be controlled Outcome: Progressing   Problem: Skin Integrity: Goal: Risk for impaired skin integrity will decrease Outcome: Progressing   "

## 2024-10-14 NOTE — Care Management Important Message (Signed)
 Important Message  Patient Details  Name: Kathryn Strickland MRN: [id number] Date of Birth: 08-May-1933   Important Message Given:  Yes - Medicare IM  Patient left prior to IM delivery will mail a copy to the patient address.    Jerimy Johanson 10/14/2024, 9:14 AM

## 2024-10-16 ENCOUNTER — Encounter: Payer: Self-pay | Admitting: Cardiology

## 2024-11-01 DEATH — deceased

## 2024-12-17 ENCOUNTER — Encounter

## 2025-03-18 ENCOUNTER — Encounter

## 2025-06-17 ENCOUNTER — Encounter

## 2025-09-16 ENCOUNTER — Encounter
